# Patient Record
Sex: Male | Born: 1945 | Race: White | Hispanic: No | Smoking: Former smoker
Health system: Southern US, Community
[De-identification: ages and names within clinical notes are randomized; demographics above are authoritative.]

## PROBLEM LIST (undated history)

## (undated) DIAGNOSIS — E785 Hyperlipidemia, unspecified: Secondary | ICD-10-CM

## (undated) DIAGNOSIS — Z87891 Personal history of nicotine dependence: Secondary | ICD-10-CM

## (undated) DIAGNOSIS — I6529 Occlusion and stenosis of unspecified carotid artery: Secondary | ICD-10-CM

## (undated) DIAGNOSIS — N184 Chronic kidney disease, stage 4 (severe): Secondary | ICD-10-CM

## (undated) DIAGNOSIS — I2510 Atherosclerotic heart disease of native coronary artery without angina pectoris: Secondary | ICD-10-CM

## (undated) DIAGNOSIS — E119 Type 2 diabetes mellitus without complications: Secondary | ICD-10-CM

## (undated) DIAGNOSIS — M1990 Unspecified osteoarthritis, unspecified site: Secondary | ICD-10-CM

## (undated) DIAGNOSIS — K219 Gastro-esophageal reflux disease without esophagitis: Secondary | ICD-10-CM

## (undated) DIAGNOSIS — R0600 Dyspnea, unspecified: Secondary | ICD-10-CM

## (undated) DIAGNOSIS — F1011 Alcohol abuse, in remission: Secondary | ICD-10-CM

## (undated) DIAGNOSIS — D649 Anemia, unspecified: Secondary | ICD-10-CM

## (undated) DIAGNOSIS — N189 Chronic kidney disease, unspecified: Secondary | ICD-10-CM

## (undated) DIAGNOSIS — J449 Chronic obstructive pulmonary disease, unspecified: Secondary | ICD-10-CM

## (undated) HISTORY — PX: TONSILLECTOMY AND ADENOIDECTOMY: SUR1326

## (undated) HISTORY — DX: Personal history of nicotine dependence: Z87.891

## (undated) HISTORY — PX: CHOLECYSTECTOMY: SHX55

## (undated) HISTORY — DX: Alcohol abuse, in remission: F10.11

## (undated) HISTORY — PX: BRONCHOSCOPY: SUR163

## (undated) HISTORY — DX: Chronic kidney disease, stage 4 (severe): N18.4

---
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
## 2013-12-11 ENCOUNTER — Observation Stay (HOSPITAL_COMMUNITY)
Admission: EM | Admit: 2013-12-11 | Discharge: 2013-12-12 | Disposition: A | Discharge status: Home / Self Care | Payer: Medicare Other | Attending: Surgery | Admitting: Surgery

## 2013-12-11 ENCOUNTER — Encounter (HOSPITAL_COMMUNITY)
Admission: EM | Disposition: A | Discharge status: Home / Self Care | Payer: Self-pay | Source: Home / Self Care | Attending: Emergency Medicine

## 2013-12-11 ENCOUNTER — Emergency Department (HOSPITAL_COMMUNITY): Payer: Medicare Other | Admitting: Certified Registered Nurse Anesthetist

## 2013-12-11 ENCOUNTER — Encounter (HOSPITAL_COMMUNITY): Payer: Medicare Other | Admitting: Certified Registered Nurse Anesthetist

## 2013-12-11 ENCOUNTER — Encounter (HOSPITAL_COMMUNITY): Payer: Self-pay | Admitting: Emergency Medicine

## 2013-12-11 ENCOUNTER — Emergency Department (HOSPITAL_COMMUNITY): Payer: Medicare Other

## 2013-12-11 DIAGNOSIS — K3580 Unspecified acute appendicitis: Secondary | ICD-10-CM

## 2013-12-11 DIAGNOSIS — R1031 Right lower quadrant pain: Secondary | ICD-10-CM

## 2013-12-11 DIAGNOSIS — F17200 Nicotine dependence, unspecified, uncomplicated: Secondary | ICD-10-CM | POA: Insufficient documentation

## 2013-12-11 DIAGNOSIS — K352 Acute appendicitis with generalized peritonitis, without abscess: Secondary | ICD-10-CM | POA: Insufficient documentation

## 2013-12-11 DIAGNOSIS — K353 Acute appendicitis with localized peritonitis, without perforation or gangrene: Secondary | ICD-10-CM | POA: Diagnosis present

## 2013-12-11 DIAGNOSIS — R112 Nausea with vomiting, unspecified: Secondary | ICD-10-CM

## 2013-12-11 DIAGNOSIS — K219 Gastro-esophageal reflux disease without esophagitis: Secondary | ICD-10-CM | POA: Insufficient documentation

## 2013-12-11 DIAGNOSIS — K35209 Acute appendicitis with generalized peritonitis, without abscess, unspecified as to perforation: Principal | ICD-10-CM | POA: Insufficient documentation

## 2013-12-11 DIAGNOSIS — R000 Tachycardia, unspecified: Secondary | ICD-10-CM | POA: Insufficient documentation

## 2013-12-11 HISTORY — DX: Chronic obstructive pulmonary disease, unspecified: J44.9

## 2013-12-11 HISTORY — PX: APPENDECTOMY: SHX54

## 2013-12-11 HISTORY — DX: Unspecified osteoarthritis, unspecified site: M19.90

## 2013-12-11 HISTORY — PX: LAPAROSCOPIC APPENDECTOMY: SHX408

## 2013-12-11 HISTORY — DX: Gastro-esophageal reflux disease without esophagitis: K21.9

## 2013-12-11 HISTORY — DX: Anemia, unspecified: D64.9

## 2013-12-11 LAB — URINALYSIS, ROUTINE W REFLEX MICROSCOPIC
Glucose, UA: NEGATIVE mg/dL
Ketones, ur: 15 mg/dL — AB
LEUKOCYTES UA: NEGATIVE
NITRITE: NEGATIVE
Protein, ur: >300 mg/dL — AB
SPECIFIC GRAVITY, URINE: 1.032 — AB (ref 1.005–1.030)
UROBILINOGEN UA: 1 mg/dL (ref 0.0–1.0)
pH: 6 (ref 5.0–8.0)

## 2013-12-11 LAB — CBC WITH DIFFERENTIAL/PLATELET
Basophils Absolute: 0 10*3/uL (ref 0.0–0.1)
Basophils Relative: 0 % (ref 0–1)
EOS ABS: 0 10*3/uL (ref 0.0–0.7)
Eosinophils Relative: 0 % (ref 0–5)
HEMATOCRIT: 42 % (ref 39.0–52.0)
Hemoglobin: 14.9 g/dL (ref 13.0–17.0)
Lymphocytes Relative: 8 % — ABNORMAL LOW (ref 12–46)
Lymphs Abs: 1.1 10*3/uL (ref 0.7–4.0)
MCH: 31.6 pg (ref 26.0–34.0)
MCHC: 35.5 g/dL (ref 30.0–36.0)
MCV: 89 fL (ref 78.0–100.0)
MONO ABS: 1.1 10*3/uL — AB (ref 0.1–1.0)
Monocytes Relative: 8 % (ref 3–12)
Neutro Abs: 12.5 10*3/uL — ABNORMAL HIGH (ref 1.7–7.7)
Neutrophils Relative %: 84 % — ABNORMAL HIGH (ref 43–77)
PLATELETS: 246 10*3/uL (ref 150–400)
RBC: 4.72 MIL/uL (ref 4.22–5.81)
RDW: 13.5 % (ref 11.5–15.5)
WBC: 14.8 10*3/uL — ABNORMAL HIGH (ref 4.0–10.5)

## 2013-12-11 LAB — COMPREHENSIVE METABOLIC PANEL
ALT: 39 U/L (ref 0–53)
AST: 24 U/L (ref 0–37)
Albumin: 3.5 g/dL (ref 3.5–5.2)
Alkaline Phosphatase: 80 U/L (ref 39–117)
BUN: 16 mg/dL (ref 6–23)
CALCIUM: 9.5 mg/dL (ref 8.4–10.5)
CO2: 20 mEq/L (ref 19–32)
CREATININE: 1.36 mg/dL — AB (ref 0.50–1.35)
Chloride: 99 mEq/L (ref 96–112)
GFR calc non Af Amer: 52 mL/min — ABNORMAL LOW (ref >90)
GFR, EST AFRICAN AMERICAN: 61 mL/min — AB (ref >90)
GLUCOSE: 153 mg/dL — AB (ref 70–99)
Potassium: 4 mEq/L (ref 3.7–5.3)
SODIUM: 136 meq/L — AB (ref 137–147)
TOTAL PROTEIN: 7.5 g/dL (ref 6.0–8.3)
Total Bilirubin: 1.1 mg/dL (ref 0.3–1.2)

## 2013-12-11 LAB — URINE MICROSCOPIC-ADD ON

## 2013-12-11 LAB — LIPASE, BLOOD: LIPASE: 42 U/L (ref 11–59)

## 2013-12-11 SURGERY — APPENDECTOMY, LAPAROSCOPIC
Anesthesia: General | Site: Abdomen

## 2013-12-11 MED ORDER — PIPERACILLIN-TAZOBACTAM 3.375 G IVPB 30 MIN: 3.3750 g | Freq: Once | INTRAVENOUS | Status: DC

## 2013-12-11 MED ORDER — LIDOCAINE HCL (CARDIAC) 20 MG/ML IV SOLN
INTRAVENOUS | Status: AC
  Filled 2013-12-11: qty 5

## 2013-12-11 MED ORDER — KCL IN DEXTROSE-NACL 20-5-0.45 MEQ/L-%-% IV SOLN
INTRAVENOUS | Status: DC
  Administered 2013-12-11: 18:00:00 via INTRAVENOUS
  Filled 2013-12-11 (×3): qty 1000

## 2013-12-11 MED ORDER — SUCCINYLCHOLINE CHLORIDE 20 MG/ML IJ SOLN
INTRAMUSCULAR | Status: DC | PRN
  Administered 2013-12-11: 100 mg via INTRAVENOUS

## 2013-12-11 MED ORDER — ROCURONIUM BROMIDE 100 MG/10ML IV SOLN
INTRAVENOUS | Status: DC | PRN
  Administered 2013-12-11: 50 mg via INTRAVENOUS

## 2013-12-11 MED ORDER — MIDAZOLAM HCL 2 MG/2ML IJ SOLN
INTRAMUSCULAR | Status: AC
  Filled 2013-12-11: qty 2

## 2013-12-11 MED ORDER — OXYCODONE HCL 5 MG PO TABS: 5.0000 mg | ORAL_TABLET | Freq: Once | ORAL | Status: AC | PRN

## 2013-12-11 MED ORDER — ONDANSETRON HCL 4 MG/2ML IJ SOLN: 4.0000 mg | Freq: Four times a day (QID) | INTRAMUSCULAR | Status: DC | PRN

## 2013-12-11 MED ORDER — FENTANYL CITRATE 0.05 MG/ML IJ SOLN
INTRAMUSCULAR | Status: AC
  Filled 2013-12-11: qty 5

## 2013-12-11 MED ORDER — OXYCODONE-ACETAMINOPHEN 5-325 MG PO TABS: 1.0000 | ORAL_TABLET | ORAL | Status: DC | PRN

## 2013-12-11 MED ORDER — LABETALOL HCL 5 MG/ML IV SOLN
INTRAVENOUS | Status: DC | PRN
  Administered 2013-12-11 (×2): 5 mg via INTRAVENOUS

## 2013-12-11 MED ORDER — SODIUM CHLORIDE 0.9 % IV BOLUS (SEPSIS)
1000.0000 mL | Freq: Once | INTRAVENOUS | Status: AC
  Administered 2013-12-11: 1000 mL via INTRAVENOUS

## 2013-12-11 MED ORDER — LACTATED RINGERS IV SOLN
INTRAVENOUS | Status: DC | PRN
  Administered 2013-12-11 (×2): via INTRAVENOUS

## 2013-12-11 MED ORDER — ALBUTEROL SULFATE (2.5 MG/3ML) 0.083% IN NEBU
INHALATION_SOLUTION | RESPIRATORY_TRACT | Status: AC
  Filled 2013-12-11: qty 3

## 2013-12-11 MED ORDER — NEOSTIGMINE METHYLSULFATE 10 MG/10ML IV SOLN
INTRAVENOUS | Status: DC | PRN
  Administered 2013-12-11: 3 mg via INTRAVENOUS

## 2013-12-11 MED ORDER — ENOXAPARIN SODIUM 40 MG/0.4ML ~~LOC~~ SOLN
40.0000 mg | SUBCUTANEOUS | Status: DC
  Filled 2013-12-11 (×2): qty 0.4

## 2013-12-11 MED ORDER — PNEUMOCOCCAL VAC POLYVALENT 25 MCG/0.5ML IJ INJ
0.5000 mL | INJECTION | INTRAMUSCULAR | Status: DC
  Filled 2013-12-11: qty 0.5

## 2013-12-11 MED ORDER — LIDOCAINE HCL (CARDIAC) 20 MG/ML IV SOLN
INTRAVENOUS | Status: DC | PRN
  Administered 2013-12-11: 100 mg via INTRAVENOUS

## 2013-12-11 MED ORDER — PIPERACILLIN-TAZOBACTAM 3.375 G IVPB 30 MIN
3.3750 g | INTRAVENOUS | Status: DC
  Filled 2013-12-11: qty 50

## 2013-12-11 MED ORDER — MORPHINE SULFATE 4 MG/ML IJ SOLN
4.0000 mg | Freq: Once | INTRAMUSCULAR | Status: AC
  Administered 2013-12-11: 4 mg via INTRAVENOUS
  Filled 2013-12-11: qty 1

## 2013-12-11 MED ORDER — PIPERACILLIN-TAZOBACTAM 3.375 G IVPB
3.3750 g | Freq: Three times a day (TID) | INTRAVENOUS | Status: DC
  Administered 2013-12-11 – 2013-12-12 (×2): 3.375 g via INTRAVENOUS
  Filled 2013-12-11 (×4): qty 50

## 2013-12-11 MED ORDER — ALBUTEROL SULFATE HFA 108 (90 BASE) MCG/ACT IN AERS
INHALATION_SPRAY | RESPIRATORY_TRACT | Status: DC | PRN
  Administered 2013-12-11: 6 via RESPIRATORY_TRACT

## 2013-12-11 MED ORDER — ONDANSETRON HCL 4 MG PO TABS: 4.0000 mg | ORAL_TABLET | Freq: Four times a day (QID) | ORAL | Status: DC | PRN

## 2013-12-11 MED ORDER — ONDANSETRON 4 MG PO TBDP
8.0000 mg | ORAL_TABLET | Freq: Once | ORAL | Status: AC
Start: 2013-12-11 — End: 2013-12-11
  Administered 2013-12-11: 8 mg via ORAL
  Filled 2013-12-11: qty 2

## 2013-12-11 MED ORDER — MORPHINE SULFATE 2 MG/ML IJ SOLN: 2.0000 mg | INTRAMUSCULAR | Status: DC | PRN

## 2013-12-11 MED ORDER — HYDROMORPHONE HCL PF 1 MG/ML IJ SOLN: 0.2500 mg | INTRAMUSCULAR | Status: DC | PRN

## 2013-12-11 MED ORDER — BUPIVACAINE-EPINEPHRINE (PF) 0.25% -1:200000 IJ SOLN
INTRAMUSCULAR | Status: AC
  Filled 2013-12-11: qty 30

## 2013-12-11 MED ORDER — CEFAZOLIN SODIUM-DEXTROSE 2-3 GM-% IV SOLR
INTRAVENOUS | Status: DC | PRN
  Administered 2013-12-11: 2 g via INTRAVENOUS

## 2013-12-11 MED ORDER — OXYCODONE HCL 5 MG/5ML PO SOLN
5.0000 mg | Freq: Once | ORAL | Status: AC | PRN
Start: 2013-12-11 — End: 2013-12-11

## 2013-12-11 MED ORDER — ACETAMINOPHEN 325 MG PO TABS: 650.0000 mg | ORAL_TABLET | ORAL | Status: DC | PRN

## 2013-12-11 MED ORDER — IOHEXOL 300 MG/ML  SOLN
25.0000 mL | INTRAMUSCULAR | Status: AC
  Administered 2013-12-11: 25 mL via ORAL

## 2013-12-11 MED ORDER — SODIUM CHLORIDE 0.9 % IV SOLN
Freq: Once | INTRAVENOUS | Status: AC
  Administered 2013-12-11: 11:00:00 via INTRAVENOUS

## 2013-12-11 MED ORDER — GLYCOPYRROLATE 0.2 MG/ML IJ SOLN
INTRAMUSCULAR | Status: DC | PRN
Start: 2013-12-11 — End: 2013-12-11
  Administered 2013-12-11: .4 mg via INTRAVENOUS

## 2013-12-11 MED ORDER — IOHEXOL 300 MG/ML  SOLN
100.0000 mL | Freq: Once | INTRAMUSCULAR | Status: AC | PRN
Start: 2013-12-11 — End: 2013-12-11
  Administered 2013-12-11: 80 mL via INTRAVENOUS

## 2013-12-11 MED ORDER — PROMETHAZINE HCL 25 MG/ML IJ SOLN
12.5000 mg | Freq: Once | INTRAMUSCULAR | Status: AC
  Administered 2013-12-11: 12.5 mg via INTRAVENOUS
  Filled 2013-12-11: qty 1

## 2013-12-11 MED ORDER — PROPOFOL 10 MG/ML IV BOLUS
INTRAVENOUS | Status: AC
  Filled 2013-12-11: qty 20

## 2013-12-11 MED ORDER — KETOROLAC TROMETHAMINE 30 MG/ML IJ SOLN
30.0000 mg | Freq: Four times a day (QID) | INTRAMUSCULAR | Status: DC
  Administered 2013-12-11 – 2013-12-12 (×3): 30 mg via INTRAVENOUS
  Filled 2013-12-11 (×7): qty 1

## 2013-12-11 MED ORDER — PROPOFOL 10 MG/ML IV BOLUS
INTRAVENOUS | Status: DC | PRN
  Administered 2013-12-11: 200 mg via INTRAVENOUS

## 2013-12-11 MED ORDER — BUPIVACAINE-EPINEPHRINE 0.25% -1:200000 IJ SOLN
INTRAMUSCULAR | Status: DC | PRN
  Administered 2013-12-11: 30 mL

## 2013-12-11 MED ORDER — ALBUTEROL SULFATE (2.5 MG/3ML) 0.083% IN NEBU
2.5000 mg | INHALATION_SOLUTION | Freq: Once | RESPIRATORY_TRACT | Status: AC
  Administered 2013-12-11: 2.5 mg via RESPIRATORY_TRACT

## 2013-12-11 MED ORDER — SODIUM CHLORIDE 0.9 % IR SOLN
Status: DC | PRN
  Administered 2013-12-11: 1000 mL

## 2013-12-11 MED ORDER — FENTANYL CITRATE 0.05 MG/ML IJ SOLN
INTRAMUSCULAR | Status: DC | PRN
  Administered 2013-12-11: 150 ug via INTRAVENOUS
  Administered 2013-12-11 (×2): 100 ug via INTRAVENOUS

## 2013-12-11 MED ORDER — ONDANSETRON HCL 4 MG/2ML IJ SOLN
INTRAMUSCULAR | Status: DC | PRN
Start: 2013-12-11 — End: 2013-12-11
  Administered 2013-12-11: 4 mg via INTRAVENOUS

## 2013-12-11 SURGICAL SUPPLY — 41 items
BENZOIN TINCTURE PRP APPL 2/3 (GAUZE/BANDAGES/DRESSINGS) ×3 IMPLANT
BLADE SURG ROTATE 9660 (MISCELLANEOUS) ×3 IMPLANT
CANISTER SUCTION 2500CC (MISCELLANEOUS) ×3 IMPLANT
CHLORAPREP W/TINT 26ML (MISCELLANEOUS) ×3 IMPLANT
CLOSURE STERI-STRIP 1/2X4 (GAUZE/BANDAGES/DRESSINGS) ×1
CLSR STERI-STRIP ANTIMIC 1/2X4 (GAUZE/BANDAGES/DRESSINGS) ×2 IMPLANT
COVER SURGICAL LIGHT HANDLE (MISCELLANEOUS) ×3 IMPLANT
CUTTER FLEX LINEAR 45M (STAPLE) ×3 IMPLANT
DRAPE UTILITY 15X26 W/TAPE STR (DRAPE) ×6 IMPLANT
DRSG TEGADERM 2-3/8X2-3/4 SM (GAUZE/BANDAGES/DRESSINGS) ×6 IMPLANT
DRSG TEGADERM 4X4.75 (GAUZE/BANDAGES/DRESSINGS) ×3 IMPLANT
ELECT REM PT RETURN 9FT ADLT (ELECTROSURGICAL) ×3
ELECTRODE REM PT RTRN 9FT ADLT (ELECTROSURGICAL) ×1 IMPLANT
FILTER SMOKE EVAC LAPAROSHD (FILTER) ×3 IMPLANT
GAUZE SPONGE 2X2 8PLY STRL LF (GAUZE/BANDAGES/DRESSINGS) ×1 IMPLANT
GLOVE BIO SURGEON STRL SZ 6.5 (GLOVE) ×2 IMPLANT
GLOVE BIO SURGEON STRL SZ7 (GLOVE) ×6 IMPLANT
GLOVE BIO SURGEONS STRL SZ 6.5 (GLOVE) ×1
GLOVE BIOGEL PI IND STRL 7.0 (GLOVE) ×1 IMPLANT
GLOVE BIOGEL PI IND STRL 7.5 (GLOVE) ×2 IMPLANT
GLOVE BIOGEL PI INDICATOR 7.0 (GLOVE) ×2
GLOVE BIOGEL PI INDICATOR 7.5 (GLOVE) ×4
GLOVE ECLIPSE 7.5 STRL STRAW (GLOVE) ×3 IMPLANT
GOWN STRL REUS W/ TWL LRG LVL3 (GOWN DISPOSABLE) ×3 IMPLANT
GOWN STRL REUS W/TWL LRG LVL3 (GOWN DISPOSABLE) ×6
KIT BASIN OR (CUSTOM PROCEDURE TRAY) ×3 IMPLANT
KIT ROOM TURNOVER OR (KITS) ×3 IMPLANT
NS IRRIG 1000ML POUR BTL (IV SOLUTION) ×3 IMPLANT
PAD ARMBOARD 7.5X6 YLW CONV (MISCELLANEOUS) ×6 IMPLANT
POUCH SPECIMEN RETRIEVAL 10MM (ENDOMECHANICALS) ×3 IMPLANT
RELOAD STAPLE TA45 3.5 REG BLU (ENDOMECHANICALS) ×3 IMPLANT
SCALPEL HARMONIC ACE (MISCELLANEOUS) ×3 IMPLANT
SET IRRIG TUBING LAPAROSCOPIC (IRRIGATION / IRRIGATOR) ×3 IMPLANT
SPECIMEN JAR SMALL (MISCELLANEOUS) ×3 IMPLANT
SPONGE GAUZE 2X2 STER 10/PKG (GAUZE/BANDAGES/DRESSINGS) ×2
SUT MNCRL AB 4-0 PS2 18 (SUTURE) ×3 IMPLANT
TOWEL OR 17X24 6PK STRL BLUE (TOWEL DISPOSABLE) ×3 IMPLANT
TOWEL OR 17X26 10 PK STRL BLUE (TOWEL DISPOSABLE) ×3 IMPLANT
TRAY LAPAROSCOPIC (CUSTOM PROCEDURE TRAY) ×3 IMPLANT
TROCAR XCEL BLADELESS 5X75MML (TROCAR) ×6 IMPLANT
TROCAR XCEL BLUNT TIP 100MML (ENDOMECHANICALS) ×3 IMPLANT

## 2013-12-11 NOTE — Progress Notes (Signed)
Patient belongings labeled and placed in PACU. Valuables sent to security via ER RN and then key given to [place].

## 2013-12-11 NOTE — ED Notes (Signed)
Per pt sts that yesterday he began having N,V and RLQ pain. Sent here to R/O appendicitis. sts small BM yesterday.

## 2013-12-11 NOTE — Op Note (Signed)
Appendectomy, Lap, Procedure Note  Indications: The patient presented with a history of right-sided abdominal pain. A CT scan revealed findings consistent with acute appendicitis.  Pre-operative Diagnosis: Acute appendicitis with generalized peritonitis  Post-operative Diagnosis: Same  Surgeon: Imogene Burn. Ravon Mortellaro   Assistants: none  Anesthesia: General endotracheal anesthesia  ASA Class: 2  Procedure Details  The patient was seen again in the Holding Room. The risks, benefits, complications, treatment options, and expected outcomes were discussed with the patient and/or family. The possibilities of reaction to medication, perforation of viscus, bleeding, recurrent infection, finding a normal appendix, the need for additional procedures, failure to diagnose a condition, and creating a complication requiring transfusion or operation were discussed. There was concurrence with the proposed plan and informed consent was obtained. The site of surgery was properly noted. The patient was taken to Operating Room, identified as Dayson Aboud and the procedure verified as Appendectomy. A Time Out was held and the above information confirmed.  The patient was placed in the supine position and general anesthesia was induced.  The abdomen was prepped and draped in a sterile fashion. A one centimeter infraumbilical incision was made.  Dissection was carried down to the fascia bluntly.  The fascia was incised vertically.  We entered the peritoneal cavity bluntly.  A pursestring suture was passed around the incision with a 0 Vicryl.  The Hasson cannula was introduced into the abdomen and the tails of the suture were used to hold the Hasson in place.   The pneumoperitoneum was then established maintaining a maximum pressure of 15 mmHg.  Additional 5 mm cannulas then placed in the left lower quadrant of the abdomen and the right upper quadrant under direct visualization. A careful evaluation of the entire abdomen  was carried out. The patient was placed in Trendelenburg and left lateral decubitus position.  The scope was moved to the right upper quadrant port site. There was some purulent fluid in the pelvis.  The cecum was mobilized medially.  The appendix was identified and was quite inflamed and ischemic-appearing.  The appendix was carefully dissected. The appendix was was skeletonized with the harmonic scalpel.   The appendix was divided at its base using an endo-GIA stapler. Minimal appendiceal stump was left in place. There was no evidence of bleeding, leakage, or complication after division of the appendix. Irrigation was also performed and irrigate suctioned from the abdomen as well.  The umbilical port site was closed with the purse string suture. There was no residual palpable fascial defect.  The trocar site skin wounds were closed with 4-0 Monocryl.  Instrument, sponge, and needle counts were correct at the conclusion of the case.   Findings: The appendix was found to be inflamed. There were signs of necrosis.  There was not perforation. There was not abscess formation.  Estimated Blood Loss:  less than 50 mL         Drains: none         Specimens: appendix         Complications:  None; patient tolerated the procedure well.         Disposition: PACU - hemodynamically stable.         Condition: stable  Imogene Burn. Georgette Dover, MD, [facility]  General/ Trauma Surgery  12/11/2013 2:36 PM

## 2013-12-11 NOTE — Anesthesia Preprocedure Evaluation (Addendum)
Anesthesia Evaluation  Patient identified by MRN, date of birth, ID band Patient awake    Reviewed: Allergy & Precautions, H&P , NPO status , Patient's Chart, lab work & pertinent test results  History of Anesthesia Complications Negative for: history of anesthetic complications  Airway Mallampati: II      Dental   Pulmonary Current Smoker,    Pulmonary exam normal       Cardiovascular negative cardio ROS  Rhythm:Regular Rate:Tachycardia     Neuro/Psych    GI/Hepatic Neg liver ROS, GERD-  Medicated,  Endo/Other  negative endocrine ROS  Renal/GU negative Renal ROS     Musculoskeletal   Abdominal   Peds  Hematology negative hematology ROS (+)   Anesthesia Other Findings   Reproductive/Obstetrics                          Anesthesia Physical Anesthesia Plan  ASA: II and emergent  Anesthesia Plan: General   Post-op Pain Management:    Induction: Intravenous  Airway Management Planned: Oral ETT  Additional Equipment:   Intra-op Plan:   Post-operative Plan: Extubation in OR  Informed Consent: I have reviewed the patients History and Physical, chart, labs and discussed the procedure including the risks, benefits and alternatives for the proposed anesthesia with the patient or authorized representative who has indicated his/her understanding and acceptance.   Dental advisory given  Plan Discussed with: CRNA, Anesthesiologist and Surgeon  Anesthesia Plan Comments:         Anesthesia Quick Evaluation

## 2013-12-11 NOTE — ED Provider Notes (Signed)
Medical screening examination/treatment/procedure(s) were conducted as a shared visit with non-physician practitioner(s) and myself.  I personally evaluated the patient during the encounter.   EKG Interpretation None      Patient presenting with vomiting and abdominal pain. Patient has exam findings concerning for appendicitis with right lower quadrant guarding, rebound. He denies any urinary symptoms. CT consistent with appendicitis without complicating features. Surgery to see the patient for appendicitis  Blanchie Dessert, MD 12/11/13 [phone]

## 2013-12-11 NOTE — Progress Notes (Signed)
Patient daughter's number [phone]

## 2013-12-11 NOTE — ED Notes (Signed)
PA Hannah at bedside.

## 2013-12-11 NOTE — H&P (Signed)
Paul Mcneil is an 69 y.o. male.   Chief Complaint: RLQ pain HPI: 68 yo male with hx of tobacco abuse presents with 24 hours of abdominal pain now localized to the RLQ.  + Nausea, vomiting.  Some diarrhea.  Presented to the ED for evaluation and was found to have acute appendicitis  History reviewed. No pertinent past medical history.  Past Surgical History  Procedure Laterality Date  . Cholecystectomy      History reviewed. No pertinent family history. Social History:  reports that he has been smoking.  He does not have any smokeless tobacco history on file. He reports that he drinks alcohol. His drug history is not on file.  Allergies:  Allergies  Allergen Reactions  . Ceclor [Cefaclor] Hives and Other (See Comments)    Dropped blood pressure    Prior to Admission medications   Medication Sig Start Date End Date Taking? Authorizing Provider  esomeprazole (NEXIUM) 40 MG capsule Take 40 mg by mouth daily as needed (for acid reflux).   Yes Historical Provider, MD  Probiotic Product (RESTORA PO) Take 1 tablet by mouth daily as needed (for regularity).   Yes Historical Provider, MD     Results for orders placed during the hospital encounter of 12/11/13 (from the past 48 hour(s))  CBC WITH DIFFERENTIAL     Status: Abnormal   Collection Time    12/11/13  9:57 AM      Result Value Ref Range   WBC 14.8 (*) 4.0 - 10.5 K/uL   RBC 4.72  4.22 - 5.81 MIL/uL   Hemoglobin 14.9  13.0 - 17.0 g/dL   HCT 62.1  94.7 - 12.5 %   MCV 89.0  78.0 - 100.0 fL   MCH 31.6  26.0 - 34.0 pg   MCHC 35.5  30.0 - 36.0 g/dL   RDW 27.1  29.2 - 90.9 %   Platelets 246  150 - 400 K/uL   Neutrophils Relative % 84 (*) 43 - 77 %   Neutro Abs 12.5 (*) 1.7 - 7.7 K/uL   Lymphocytes Relative 8 (*) 12 - 46 %   Lymphs Abs 1.1  0.7 - 4.0 K/uL   Monocytes Relative 8  3 - 12 %   Monocytes Absolute 1.1 (*) 0.1 - 1.0 K/uL   Eosinophils Relative 0  0 - 5 %   Eosinophils Absolute 0.0  0.0 - 0.7 K/uL   Basophils  Relative 0  0 - 1 %   Basophils Absolute 0.0  0.0 - 0.1 K/uL  COMPREHENSIVE METABOLIC PANEL     Status: Abnormal   Collection Time    12/11/13  9:57 AM      Result Value Ref Range   Sodium 136 (*) 137 - 147 mEq/L   Potassium 4.0  3.7 - 5.3 mEq/L   Chloride 99  96 - 112 mEq/L   CO2 20  19 - 32 mEq/L   Glucose, Bld 153 (*) 70 - 99 mg/dL   BUN 16  6 - 23 mg/dL   Creatinine, Ser 0.30 (*) 0.50 - 1.35 mg/dL   Calcium 9.5  8.4 - 14.9 mg/dL   Total Protein 7.5  6.0 - 8.3 g/dL   Albumin 3.5  3.5 - 5.2 g/dL   AST 24  0 - 37 U/L   ALT 39  0 - 53 U/L   Alkaline Phosphatase 80  39 - 117 U/L   Total Bilirubin 1.1  0.3 - 1.2 mg/dL   GFR calc non  Af Amer 52 (*) >90 mL/min   GFR calc Af Amer 61 (*) >90 mL/min   Comment: (NOTE)     The eGFR has been calculated using the CKD EPI equation.     This calculation has not been validated in all clinical situations.     eGFR's persistently <90 mL/min signify possible Chronic Kidney     Disease.  LIPASE, BLOOD     Status: None   Collection Time    12/11/13  9:57 AM      Result Value Ref Range   Lipase 42  11 - 59 U/L  URINALYSIS, ROUTINE W REFLEX MICROSCOPIC     Status: Abnormal   Collection Time    12/11/13 10:32 AM      Result Value Ref Range   Color, Urine AMBER (*) YELLOW   Comment: BIOCHEMICALS MAY BE AFFECTED BY COLOR   APPearance CLEAR  CLEAR   Specific Gravity, Urine 1.032 (*) 1.005 - 1.030   pH 6.0  5.0 - 8.0   Glucose, UA NEGATIVE  NEGATIVE mg/dL   Hgb urine dipstick MODERATE (*) NEGATIVE   Bilirubin Urine SMALL (*) NEGATIVE   Ketones, ur 15 (*) NEGATIVE mg/dL   Protein, ur >854 (*) NEGATIVE mg/dL   Urobilinogen, UA 1.0  0.0 - 1.0 mg/dL   Nitrite NEGATIVE  NEGATIVE   Leukocytes, UA NEGATIVE  NEGATIVE  URINE MICROSCOPIC-ADD ON     Status: Abnormal   Collection Time    12/11/13 10:32 AM      Result Value Ref Range   Squamous Epithelial / LPF RARE  RARE   WBC, UA 0-2  <3 WBC/hpf   RBC / HPF 0-2  <3 RBC/hpf   Bacteria, UA RARE   RARE   Casts HYALINE CASTS (*) NEGATIVE   Ct Abdomen Pelvis W Contrast  12/11/2013   CLINICAL DATA:  Nausea, vomiting, and right lower quadrant pain.  EXAM: CT ABDOMEN AND PELVIS WITH CONTRAST  TECHNIQUE: Multidetector CT imaging of the abdomen and pelvis was performed using the standard protocol following bolus administration of intravenous contrast.  CONTRAST:  62mL OMNIPAQUE IOHEXOL 300 MG/ML SOLN intravenously. The patient also received oral contrast material.  COMPARISON:  None.  FINDINGS: On images 19 through 50 the appendix is inflamed appearing and contains an appendicolith. There is increased density in the surrounding fat. A discrete abscess is not demonstrated. No free fluid is demonstrated in the abdomen or pelvis. The small and large bowel loops exhibit no obstructive findings. There is a moderate volume of fluid in the right colon.  The gallbladder is surgically absent. The liver exhibits decreased density diffusely consistent with fatty infiltration. The pancreas, spleen, stomach, adrenal glands, and periaortic and pericaval regions are normal. There is a small hiatal hernia. There is mild failure to type of the caliber of the abdominal aorta without evidence of an aneurysm. There is a 2.1 cm diameter hypodensity in the lower pole of the left kidney with Hounsfield measurement of -4 consistent with a cyst. The urinary bladder and prostate gland and seminal vesicles are normal. There is no inguinal nor umbilical hernia.  The lumbar spine and bony pelvis are normal for age. There is dependent atelectasis at the lung bases.  IMPRESSION: 1. The findings are consistent with acute appendicitis. There are surrounding inflammatory changes without evidence of an abscess or free fluid or free air. 2. There is no acute intra-abdominal or pelvic abnormality otherwise. 3. These results were called by telephone at the time of  interpretation on 12/11/2013 at 11:46 AM to [facility], , who verbally  acknowledged these results.   Electronically Signed   By: David  Martinique   On: 12/11/2013 11:49    Review of Systems  Constitutional: Negative for weight loss.  HENT: Negative for ear discharge, ear pain, hearing loss and tinnitus.   Eyes: Negative for blurred vision, double vision, photophobia and pain.  Respiratory: Negative for cough, sputum production and shortness of breath.   Cardiovascular: Negative for chest pain.  Gastrointestinal: Positive for nausea, vomiting, abdominal pain and diarrhea.  Genitourinary: Negative for dysuria, urgency, frequency and flank pain.  Musculoskeletal: Negative for back pain, falls, joint pain, myalgias and neck pain.  Neurological: Negative for dizziness, tingling, sensory change, focal weakness, loss of consciousness and headaches.  Endo/Heme/Allergies: Does not bruise/bleed easily.  Psychiatric/Behavioral: Negative for depression, memory loss and substance abuse. The patient is not nervous/anxious.     Blood pressure 129/79, pulse 88, temperature 98.3 F (36.8 C), resp. rate 16, weight 230 lb (104.327 kg), SpO2 100.00%. Physical Exam  WDWN in NAD HEENT:  EOMI, sclera anicteric Neck:  No masses, no thyromegaly Lungs:  CTA bilaterally; normal respiratory effort CV:  Regular rate and rhythm; no murmurs Abd:  +bowel sounds, soft, tender in RLQ; healed lap chole incisions Ext:  Well-perfused; no edema Skin:  Warm, dry; no sign of jaundice  Assessment/Plan Acute appendicitis - no obvious sign of perforation  Laparoscopic appendectomy today.  The surgical procedure has been discussed with the patient.  Potential risks, benefits, alternative treatments, and expected outcomes have been explained.  All of the patient's questions at this time have been answered.  The likelihood of reaching the patient's treatment goal is good.  The patient understand the proposed surgical procedure and wishes to proceed.   Imogene Burn. Maegan Buller 12/11/2013, 12:43 PM

## 2013-12-11 NOTE — Transfer of Care (Signed)
Immediate Anesthesia Transfer of Care Note  Patient: Paul Mcneil  Procedure(s) Performed: Procedure(s): APPENDECTOMY LAPAROSCOPIC (N/A)  Patient Location: PACU  Anesthesia Type:General  Level of Consciousness: awake, alert  and patient cooperative  Airway & Oxygen Therapy: Patient Spontanous Breathing, Patient connected to nasal cannula oxygen and Patient connected to face mask oxygen  Post-op Assessment: Report given to PACU RN and Post -op Vital signs reviewed and stable  Post vital signs: Reviewed and stable  Complications: No apparent anesthesia complications

## 2013-12-11 NOTE — Anesthesia Postprocedure Evaluation (Signed)
  Anesthesia Post-op Note  Patient: Paul Mcneil  Procedure(s) Performed: Procedure(s): APPENDECTOMY LAPAROSCOPIC (N/A)  Patient Location: PACU  Anesthesia Type:General  Level of Consciousness: awake and alert   Airway and Oxygen Therapy: Patient Spontanous Breathing  Post-op Pain: none  Post-op Assessment: Post-op Vital signs reviewed, Patient's Cardiovascular Status Stable and Respiratory Function Stable  Post-op Vital Signs: Reviewed  Filed Vitals:   12/11/13 1610  BP: 114/61  Pulse: 101  Temp: 37.3 C  Resp: 13    Complications: No apparent anesthesia complications

## 2013-12-11 NOTE — Anesthesia Procedure Notes (Signed)
Date/Time: 12/11/2013 1:40 PM Performed by: Izora Gala Pre-anesthesia Checklist: Patient identified, Emergency Drugs available, Suction available and Patient being monitored Patient Re-evaluated:Patient Re-evaluated prior to inductionOxygen Delivery Method: Circle system utilized Preoxygenation: Pre-oxygenation with 100% oxygen Intubation Type: IV induction Ventilation: Mask ventilation without difficulty Laryngoscope Size: Miller and 3 Grade View: Grade II Tube type: Oral Tube size: 7.5 mm Number of attempts: 1 Airway Equipment and Method: Stylet Placement Confirmation: ETT inserted through vocal cords under direct vision,  positive ETCO2 and breath sounds checked- equal and bilateral Secured at: 23 cm Tube secured with: Tape Dental Injury: Teeth and Oropharynx as per pre-operative assessment

## 2013-12-11 NOTE — ED Notes (Signed)
PA at bedside.

## 2013-12-11 NOTE — ED Provider Notes (Signed)
CSN: [id number]     Arrival date & time 12/11/13  4580 History   First MD Initiated Contact with Patient 12/11/13 [phone]     Chief Complaint  Patient presents with  . Abdominal Pain     (Consider location/radiation/quality/duration/timing/severity/associated sxs/prior Treatment) HPI Comments: Patient is a 68 year old male with history of prior cholecystectomy who presents today with nausea, vomiting, abdominal pain since yesterday. He reports that the pain started suddenly and is a sharp pain in his right lower quadrant. The pain began in his entire abdomen, but now is only in his right lower quadrant. He had mild associated diarrhea this morning. [facility] who suggested he come to the emergency department for appendicitis rule out. No fevers, chills, testicle pain, dysuria or chest pains or shortness.  Patient is a 68 y.o. male presenting with abdominal pain. The history is provided by the patient. No language interpreter was used.  Abdominal Pain Associated symptoms: diarrhea, nausea and vomiting   Associated symptoms: no chest pain, no chills, no fever and no shortness of breath     History reviewed. No pertinent past medical history. Past Surgical History  Procedure Laterality Date  . Cholecystectomy     History reviewed. No pertinent family history. History  Substance Use Topics  . Smoking status: Current Every Day Smoker  . Smokeless tobacco: Not on file  . Alcohol Use: Yes    Review of Systems  Constitutional: Negative for fever and chills.  Respiratory: Negative for shortness of breath.   Cardiovascular: Negative for chest pain.  Gastrointestinal: Positive for nausea, vomiting, abdominal pain and diarrhea.  Genitourinary: Negative for testicular pain.  All other systems reviewed and are negative.     Allergies  Ceclor  Home Medications   Prior to Admission medications   Not on File   BP 135/74  Pulse 92  Temp(Src) 98.3 F (36.8 C)  Resp 18   Wt 230 lb (104.327 kg)  SpO2 95% Physical Exam  Nursing note and vitals reviewed. Constitutional: He is oriented to person, place, and time. He appears well-developed and well-nourished. No distress.  HENT:  Head: Normocephalic and atraumatic.  Right Ear: External ear normal.  Left Ear: External ear normal.  Nose: Nose normal.  Eyes: Conjunctivae are normal.  Neck: Normal range of motion. No tracheal deviation present.  Cardiovascular: Normal rate, regular rhythm and normal heart sounds.   Pulmonary/Chest: Effort normal and breath sounds normal. No stridor.  Abdominal: Soft. He exhibits no distension. There is tenderness in the right lower quadrant. There is no rigidity, no rebound and no guarding.  Genitourinary: Penis normal. Right testis shows no mass, no swelling and no tenderness. Right testis is descended. Left testis shows no mass, no swelling and no tenderness. Left testis is descended. Circumcised.  Musculoskeletal: Normal range of motion.  Neurological: He is alert and oriented to person, place, and time.  Skin: Skin is warm and dry. He is not diaphoretic.  Psychiatric: He has a normal mood and affect. His behavior is normal.    ED Course  Procedures (including critical care time) Labs Review Labs Reviewed  CBC WITH DIFFERENTIAL - Abnormal; Notable for the following:    WBC 14.8 (*)    Neutrophils Relative % 84 (*)    Neutro Abs 12.5 (*)    Lymphocytes Relative 8 (*)    Monocytes Absolute 1.1 (*)    All other components within normal limits  COMPREHENSIVE METABOLIC PANEL - Abnormal; Notable for the following:  Sodium 136 (*)    Glucose, Bld 153 (*)    Creatinine, Ser 1.36 (*)    GFR calc non Af Amer 52 (*)    GFR calc Af Amer 61 (*)    All other components within normal limits  URINALYSIS, ROUTINE W REFLEX MICROSCOPIC - Abnormal; Notable for the following:    Color, Urine AMBER (*)    Specific Gravity, Urine 1.032 (*)    Hgb urine dipstick MODERATE (*)     Bilirubin Urine SMALL (*)    Ketones, ur 15 (*)    Protein, ur >300 (*)    All other components within normal limits  URINE MICROSCOPIC-ADD ON - Abnormal; Notable for the following:    Casts HYALINE CASTS (*)    All other components within normal limits  LIPASE, BLOOD    Imaging Review Ct Abdomen Pelvis W Contrast  12/11/2013   CLINICAL DATA:  Nausea, vomiting, and right lower quadrant pain.  EXAM: CT ABDOMEN AND PELVIS WITH CONTRAST  TECHNIQUE: Multidetector CT imaging of the abdomen and pelvis was performed using the standard protocol following bolus administration of intravenous contrast.  CONTRAST:  23mL OMNIPAQUE IOHEXOL 300 MG/ML SOLN intravenously. The patient also received oral contrast material.  COMPARISON:  None.  FINDINGS: On images 44 through 36 the appendix is inflamed appearing and contains an appendicolith. There is increased density in the surrounding fat. A discrete abscess is not demonstrated. No free fluid is demonstrated in the abdomen or pelvis. The small and large bowel loops exhibit no obstructive findings. There is a moderate volume of fluid in the right colon.  The gallbladder is surgically absent. The liver exhibits decreased density diffusely consistent with fatty infiltration. The pancreas, spleen, stomach, adrenal glands, and periaortic and pericaval regions are normal. There is a small hiatal hernia. There is mild failure to type of the caliber of the abdominal aorta without evidence of an aneurysm. There is a 2.1 cm diameter hypodensity in the lower pole of the left kidney with Hounsfield measurement of -4 consistent with a cyst. The urinary bladder and prostate gland and seminal vesicles are normal. There is no inguinal nor umbilical hernia.  The lumbar spine and bony pelvis are normal for age. There is dependent atelectasis at the lung bases.  IMPRESSION: 1. The findings are consistent with acute appendicitis. There are surrounding inflammatory changes without evidence  of an abscess or free fluid or free air. 2. There is no acute intra-abdominal or pelvic abnormality otherwise. 3. These results were called by telephone at the time of interpretation on 12/11/2013 at 11:46 AM to [facility], [place], , who verbally acknowledged these results.   Electronically Signed   By: David  Martinique   On: 12/11/2013 11:49     EKG Interpretation None      MDM   Final diagnoses:  Acute appendicitis    Patient presents to ED for evaluation of abdominal pain. CT abdomen shows acute appendicitis. Patient is otherwise healthy. Surgery has been consulted and plans to take patient to OR. Hemodynamically stable. Dr. Maryan Rued evaluated patient and agrees with plan. Patient / Family / Caregiver informed of clinical course, understand medical decision-making process, and agree with plan.    Elwyn Lade, PA-C 12/11/13 1401

## 2013-12-11 NOTE — OR Nursing (Signed)
To OR with personal belongings in two separate belonging bags, with name labels on each. Eyeglasses and dentures in bag also.  Patient valuables envelope received on arrival to OR with #5 written on envelope. This document is included with loose "chart" from ED. Will send all belongings and valuables key to PACU on discharge from [place].

## 2013-12-12 MED ORDER — HYDROCODONE-ACETAMINOPHEN 5-325 MG PO TABS: 1.0000 | ORAL_TABLET | Freq: Four times a day (QID) | ORAL | Status: DC | PRN

## 2013-12-12 MED ORDER — AMOXICILLIN-POT CLAVULANATE 875-125 MG PO TABS: 1.0000 | ORAL_TABLET | Freq: Two times a day (BID) | ORAL | Status: DC

## 2013-12-12 NOTE — Discharge Summary (Signed)
  Patient ID: Paul Mcneil [id number] 68 y.o. 01/17/1946  Admit date: 12/11/2013  Discharge date and time: No discharge date for patient encounter.  Admitting Physician: CCS,MD  Discharge Physician: Adin Hector  Admission Diagnoses: ABD PAIN appendicitis  Discharge Diagnoses: Acute suppurative appendicitis Operations: Procedure(s): APPENDECTOMY LAPAROSCOPIC  Admission Condition: fair  Discharged Condition: good  Indication for Admission: 68 year old male with history of tobacco abuse presents with 24 hours of abdominal pain localized right lower quadrant with nausea and vomiting and some diarrhea.  Examination was notable for abdomen that was soft but tender in the right lower quadrant and healing trocar sites from prior laparoscopic cholecystectomy.WBC 14,000. CT showed thickened inflamed appendix but no abscess or obstruction.  Hospital Course: The day of admission the patient was evaluated, resuscitated, started on antibiotics, and taken to the operating room and underwent a laparoscopic appendectomy. Findings were consistent with acute appendicitis and purulent fluid in the pelvis. The patient remained stable overnight and felt much better the following morning. He was ambulatory, voiding without difficulty and tolerating a diet. His abdomen was soft, minimally tender and all of his wounds looked good. He was discharged with prescription for 7 days of [place] in the office in 3 weeks.  Consults: None  Significant Diagnostic Studies: CT scan. Surgical pathology pending.  Treatments: surgery: Laparoscopic appendectomy  Disposition: Home  Patient Instructions:    Medication List         amoxicillin-clavulanate 875-125 MG per tablet  Commonly known as:  AUGMENTIN  Take 1 tablet by mouth 2 (two) times daily.     esomeprazole 40 MG capsule  Commonly known as:  NEXIUM  Take 40 mg by mouth daily as needed (for acid reflux).     HYDROcodone-acetaminophen 5-325 MG per  tablet  Commonly known as:  NORCO  Take 1-2 tablets by mouth every 6 (six) hours as needed.     RESTORA PO  Take 1 tablet by mouth daily as needed (for regularity).        Activity: activity as tolerated Diet: low fat, low cholesterol diet Wound Care: none needed  Follow-up:  With Dr. Georgette Dover in 3 weeks.  Signed: Edsel Petrin. Dalbert Batman, M.D., FACS General and minimally invasive surgery Breast and Colorectal Surgery  12/12/2013, 9:04 AM

## 2013-12-12 NOTE — Discharge Instructions (Signed)
See above

## 2013-12-12 NOTE — Progress Notes (Signed)
Prescriptions for Norco and Augmentin given. Discharge instructions given. Wheeled to exit where daughter is waiting. Melford Aase, RN 12/12/2013 1049

## 2013-12-15 ENCOUNTER — Encounter (HOSPITAL_COMMUNITY): Payer: Self-pay | Admitting: Surgery

## 2016-07-14 DIAGNOSIS — Z760 Encounter for issue of repeat prescription: Secondary | ICD-10-CM | POA: Diagnosis not present

## 2016-07-14 DIAGNOSIS — M25512 Pain in left shoulder: Secondary | ICD-10-CM | POA: Diagnosis not present

## 2016-12-24 ENCOUNTER — Other Ambulatory Visit: Payer: Self-pay | Admitting: Family Medicine

## 2016-12-24 DIAGNOSIS — R5383 Other fatigue: Secondary | ICD-10-CM | POA: Diagnosis not present

## 2016-12-24 DIAGNOSIS — Z87891 Personal history of nicotine dependence: Secondary | ICD-10-CM | POA: Diagnosis not present

## 2016-12-24 DIAGNOSIS — R0602 Shortness of breath: Secondary | ICD-10-CM

## 2016-12-24 DIAGNOSIS — R9431 Abnormal electrocardiogram [ECG] [EKG]: Secondary | ICD-10-CM | POA: Diagnosis not present

## 2016-12-25 ENCOUNTER — Ambulatory Visit
Admission: RE | Admit: 2016-12-25 | Discharge: 2016-12-25 | Disposition: A | Discharge status: Home / Self Care | Payer: Medicare Other | Source: Ambulatory Visit | Attending: Family Medicine | Admitting: Family Medicine

## 2016-12-25 ENCOUNTER — Telehealth: Payer: Self-pay | Admitting: Cardiology

## 2016-12-25 ENCOUNTER — Telehealth: Payer: Self-pay

## 2016-12-25 DIAGNOSIS — J189 Pneumonia, unspecified organism: Secondary | ICD-10-CM | POA: Diagnosis not present

## 2016-12-25 DIAGNOSIS — R0602 Shortness of breath: Secondary | ICD-10-CM

## 2016-12-25 MED ORDER — IOPAMIDOL (ISOVUE-300) INJECTION 61%
64.0000 mL | Freq: Once | INTRAVENOUS | Status: AC | PRN
  Administered 2016-12-25: 64 mL via INTRAVENOUS

## 2016-12-25 NOTE — Telephone Encounter (Signed)
Received records from [facility] for appointment on 01/03/17 with Dr [name].  Records put with Dr Allison Quarry schedule for 01/03/17. lp

## 2016-12-25 NOTE — Telephone Encounter (Signed)
FAXED NOTES TO [facility]

## 2017-01-01 DIAGNOSIS — J439 Emphysema, unspecified: Secondary | ICD-10-CM | POA: Diagnosis not present

## 2017-01-01 DIAGNOSIS — R0602 Shortness of breath: Secondary | ICD-10-CM | POA: Diagnosis not present

## 2017-01-01 DIAGNOSIS — Z13220 Encounter for screening for lipoid disorders: Secondary | ICD-10-CM | POA: Diagnosis not present

## 2017-01-01 DIAGNOSIS — Z23 Encounter for immunization: Secondary | ICD-10-CM | POA: Diagnosis not present

## 2017-01-02 DIAGNOSIS — Z13220 Encounter for screening for lipoid disorders: Secondary | ICD-10-CM | POA: Diagnosis not present

## 2017-01-02 DIAGNOSIS — E782 Mixed hyperlipidemia: Secondary | ICD-10-CM | POA: Diagnosis not present

## 2017-01-03 ENCOUNTER — Ambulatory Visit (INDEPENDENT_AMBULATORY_CARE_PROVIDER_SITE_OTHER): Payer: Medicare Other | Admitting: Cardiology

## 2017-01-03 ENCOUNTER — Encounter: Payer: Self-pay | Admitting: Cardiology

## 2017-01-03 VITALS — BP 136/80 | HR 80 | Ht 73.0 in | Wt 248.0 lb

## 2017-01-03 DIAGNOSIS — R0609 Other forms of dyspnea: Secondary | ICD-10-CM | POA: Diagnosis not present

## 2017-01-03 DIAGNOSIS — R079 Chest pain, unspecified: Secondary | ICD-10-CM | POA: Diagnosis not present

## 2017-01-03 DIAGNOSIS — R0989 Other specified symptoms and signs involving the circulatory and respiratory systems: Secondary | ICD-10-CM | POA: Diagnosis not present

## 2017-01-03 DIAGNOSIS — R9431 Abnormal electrocardiogram [ECG] [EKG]: Secondary | ICD-10-CM | POA: Insufficient documentation

## 2017-01-03 DIAGNOSIS — I739 Peripheral vascular disease, unspecified: Secondary | ICD-10-CM | POA: Diagnosis not present

## 2017-01-03 NOTE — Patient Instructions (Addendum)
SCHEDULE ALL TESTS AT [place] Your physician has requested that you have en exercise stress myoview. For further information please visit [email or web address]. Please follow instruction sheet, as given.   Your physician has requested that you have an ankle brachial index (ABI). During this test an ultrasound and blood pressure cuff are used to evaluate the arteries that supply the arms and legs with blood. Allow thirty minutes for this exam. There are no restrictions or special instructions.  Your physician has requested that you have a lower extremity arterial exercise duplex. During this test, exercise and ultrasound are used to evaluate arterial blood flow in the legs. Allow one hour for this exam. There are no restrictions or special instructions.  Your physician has requested that you have a carotid duplex. This test is an ultrasound of the carotid arteries in your neck. It looks at blood flow through these arteries that supply the brain with blood. Allow one hour for this exam. There are no restrictions or special instructions.   Your physician recommends that you schedule a follow-up appointment in 1 MONTH WITH DR HARDING AFTER ALL TEST ARE COMPLETED.

## 2017-01-03 NOTE — Progress Notes (Signed)
PCP: [name], MD  Clinic Note: Chief Complaint  Patient presents with  . New Patient (Initial Visit)    abnormal ekg     HPI: Paul Mcneil is a 71 y.o. male who is being seen today for the evaluation of Shortness of Breath with Exertion & an abnrmal EKG at the request of Koirala, Dibas, MD. 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   He quit smoking 3 months ago along with alcohol.  Paul Mcneil was last seen on June 4th by his PCP for Dyspnea & leg fatigue while walking.  Recent Hospitalizations: n/a  Studies Personally Reviewed - (if available, images/films reviewed: From Epic Chart or Care Everywhere)  none  Interval History:  Paul Mcneil today with 2 major complaints.   He notes getting short of breath with walking. Previously he was able to walk without much difficulty, but now he gets short of breath simply walking back and forth to the mailbox. He was walking around at [employer], and he had to stop a sit down because of shortness of breath. This is the first time he has had this happen to him where he couldn't walk to the complete store. He did stop smoking about 3 months ago along with alcohol as well.  He also notes extreme leg fatigue and weakness/heaviness with walking that is alleviated by rest. He pretty much says that the combination of leg fatigue and shortness of breath has kept him from doing pretty much any exercise.   He denies any resting dyspnea or leg pain, but does note that the dyspnea and leg discomfort with exertion his gotten worse over the last couple months and most notably over the last month. Despite having exertional dyspnea, he really hasn't noticed exertional chest discomfort although he has had some tightness if he pushes beyond where he thinks he needs to sit down. No PND, orthopnea or edema. No rapid irregular heartbeats palpitations.  No  lightheadedness, dizziness, weakness or syncope/near syncope. No TIA/amaurosis fugax symptoms. No claudication.  ROS: A comprehensive was performed. Review of Systems  Constitutional: Negative for malaise/fatigue.  HENT: Negative for congestion and nosebleeds.   Respiratory: Positive for cough (Morning cough. Getting better since he quit smoking), shortness of breath and wheezing (Not as frequently.).   Cardiovascular:       Per history of present illness  Gastrointestinal: Negative for blood in stool, heartburn and melena.  Genitourinary: Negative for hematuria.  Musculoskeletal: Positive for back pain and joint pain (Knees, ankles and hips bother him).  Neurological: Negative for dizziness and focal weakness.  Psychiatric/Behavioral: Negative for depression and memory loss. The patient is not nervous/anxious and does not have insomnia.   All other systems reviewed and are negative.  PAD Screen 01/03/2017 01/03/2017  Previous PAD dx? - No  Previous surgical procedure? - No  Pain with walking? Yes Yes  Subsides with rest? - Yes  Feet/toe relief with dangling? - No  Painful, non-healing ulcers? - No  Extremities discolored? - No    I have reviewed and (if needed) personally updated the patient's problem list, medications, allergies, past medical and surgical history, social and family history.   Past Medical History:  Diagnosis Date  . Anemia 1964  . Arthritis    "touch starting to show up in the joints" (12/11/2013)  . COPD (chronic obstructive pulmonary disease) ([place])    "a  little bit" (12/11/2013)  . Former heavy tobacco smoker   . GERD (gastroesophageal reflux disease)    Dr. Michail Sermon  . History of alcohol abuse     Past Surgical History:  Procedure Laterality Date  . APPENDECTOMY  12/11/2013   "laparoscopic"  . CHOLECYSTECTOMY  ~ 2010  . LAPAROSCOPIC APPENDECTOMY N/A 12/11/2013   Procedure: APPENDECTOMY LAPAROSCOPIC;  Surgeon: Imogene Burn. Georgette Dover, MD;  Location: [facility];   Service: General;  Laterality: N/A;  . TONSILLECTOMY AND ADENOIDECTOMY  1950's    Current Meds  Medication Sig  . aspirin EC 81 MG tablet Take 81 mg by mouth daily.  Marland Kitchen atorvastatin (LIPITOR) 10 MG tablet Take 10 mg by mouth daily.  Marland Kitchen esomeprazole (NEXIUM) 40 MG capsule Take 40 mg by mouth daily as needed (for acid reflux).  . Lactobacillus Casei-Folic Acid (RESTORA RX) 60-1.25 MG CAPS Take 1 tablet by mouth as needed.    Allergies  Allergen Reactions  . Ceclor [Cefaclor] Hives and Other (See Comments)    Dropped blood pressure    Social History   Social History  . Marital status: Single    Spouse name: N/A  . Number of children: N/A  . Years of education: N/A   Social History Main Topics  . Smoking status: Former Smoker    Packs/day: 2.00    Years: 52.00    Quit date: 09/21/2016  . Smokeless tobacco: Never Used  . Alcohol use 12.0 oz/week    20 Glasses of wine per week  . Drug use: Yes  . Sexual activity: No   Other Topics Concern  . None   Social History Narrative  . None    family history includes Cancer in his father; Rheum arthritis in his mother.  Wt Readings from Last 3 Encounters:  01/03/17 248 lb (112.5 kg)  12/11/13 230 lb (104.3 kg)    PHYSICAL EXAM BP 136/80   Pulse 80   Ht 6\' 1"  (1.854 m)   Wt 248 lb (112.5 kg)   BMI 32.72 kg/m  General appearance: alert, cooperative, appears stated age, no distress. mildly obese (he lost ~5-10 lb since quitting EtOH) HEENT: [place]/AT, EOMI, MMM, anicteric sclera Neck: no adenopathy, Bilateral (L>R) carotid bruit; no JVD Lungs: Mld scattered interstitial sounds /crackles without Rales/Rhonchi.  Late Expiratory wheezing with forced breaths. Non-labored. Good Air movement. Heart: regular rate and rhythm, S1 &S2 normal, no murmur, click, rub or gallop; non-displaced PMI. Abdomen: soft, non-tender; bowel sounds normal; no masses,  no organomegaly; no HJR Extremities: extremities normal, atraumatic, no cyanosis, or  edema; Bilateral femoral bruits Pulses: 2+ and symmetric radial.  Mildly diminished pulses bilaterally. Skin: mobility and turgor normal, no evidence of bleeding or bruising, no lesions noted, temperature normal and texture normal or  Neurologic: Mental status: Alert & oriented x 3, thought content appropriate; non-focal exam.  Pleasant mood & affect. Cranial nerves: normal (II-XII grossly intact)    Adult ECG Report  Rate: 80 ;  Rhythm: normal sinus rhythm and Cannot exclude inferior MI, age undetermined with Q waves noted. Otherwise normal axis, intervals and durations.;   Narrative Interpretation: Borderline abnormal EKG with possible inferior MI, age undetermined.  Other studies Reviewed: Additional studies/ records that were reviewed today include:  Recent Labs:  n/a   ASSESSMENT / PLAN: Problem List Items Addressed This Visit    Abnormal electrocardiogram (ECG) (EKG) (Chronic)    Usually this type of finding on EKG is nonspecific, however given his history and now presenting symptoms I  do think we need to exclude CAD with his exertional dyspnea that is relatively sudden onset.  Plan: We will begin with Treadmill Myoview to exclude ischemia. This will also give [place] an assessment of his ejection fraction. If abnormal, would then consider echocardiogram.      Relevant Orders   EKG 12-Lead   Myocardial Perfusion Imaging   Bilateral carotid bruits    Bilateral carotid bruits and patient with smoking history and symptoms concerning for claudication and possibly even coronary ischemia. Plan: Check carotid Dopplers. - If his carotids are to the extent that intervention would be recommended, I would probably refer to Dr. Gwenlyn Found for both PAD and carotid disease.      Relevant Orders   EKG 12-Lead   VAS US CAROTID   Chest pain with moderate risk for cardiac etiology    Although not as prominent asymptomatic as his exertional dyspnea, he did indeed note some chest discomfort and  tightness with exertion on more detailed questioning. With the amount of progressive exertional dyspnea, I have chosen to exclude coronary ischemia with a Myoview over simply checking a 2-D echo.      Claudication of both lower extremities (HCC)    Bilateral leg weakness and heaviness with walking and femoral bruits noted on exam. Concerning symptoms for claudication. In addition to evaluating for coronary ischemia, we will order abdominal and lower extremity arterial Dopplers. With lower short arterial Dopplers, we first checked ABIs if these are normal then no further studies done, however ABIs are abnormal, complete duplex is done.  If abnormal, will be referred to our vascular cardiologist either Dr. Joseph Pierini. Gwenlyn Found or Dr. Saunders Revel       Relevant Orders   EKG 12-Lead   VAS [place] ABI WITH/WO TBI   VAS [place] LOWER EXTREMITY ARTERIAL DUPLEX   Exertional dyspnea - Primary    Exertional dyspnea could very well be related to COPD from long-term smoking, however the fact that this is relatively new onset for him to this extent, we need to exclude ischemia. He did have some chest tightness albeit not all the time which would also be concerning for possible anginal equivalent. Plan: We Will Begin with Treadmill Myoview; pending results consider echocardiogram versus moving forward to cardiac catheterization.      Relevant Orders   EKG 12-Lead   Myocardial Perfusion Imaging      Current medicines are reviewed at length with the patient today. (+/- concerns) n/a The following changes have been made: n/a  Patient Instructions  SCHEDULE ALL TESTS AT [facility] physician has requested that you have en exercise stress myoview. For further information please visit [email or web address]. Please follow instruction sheet, as given.   Your physician has requested that you have an ankle brachial index (ABI). During this test an ultrasound and blood pressure cuff are used to evaluate  the arteries that supply the arms and legs with blood. Allow thirty minutes for this exam. There are no restrictions or special instructions.  Your physician has requested that you have a lower extremity arterial exercise duplex. During this test, exercise and ultrasound are used to evaluate arterial blood flow in the legs. Allow one hour for this exam. There are no restrictions or special instructions.  Your physician has requested that you have a carotid duplex. This test is an ultrasound of the carotid arteries in your neck. It looks at blood flow through these arteries that supply the brain with blood. Allow one hour for  this exam. There are no restrictions or special instructions.   Your physician recommends that you schedule a follow-up appointment in 1 MONTH WITH DR HARDING AFTER ALL TEST ARE COMPLETED.    Studies Ordered:   Orders Placed This Encounter  Procedures  . Myocardial Perfusion Imaging  . EKG 12-Lead      Glenetta Hew, M.D., M.S. Interventional Cardiologist   Pager # [phone] Phone # [phone] [address]. [place], [place] [address]

## 2017-01-05 ENCOUNTER — Encounter: Payer: Self-pay | Admitting: Cardiology

## 2017-01-05 DIAGNOSIS — I200 Unstable angina: Secondary | ICD-10-CM | POA: Insufficient documentation

## 2017-01-05 NOTE — Assessment & Plan Note (Signed)
Bilateral carotid bruits and patient with smoking history and symptoms concerning for claudication and possibly even coronary ischemia. Plan: Check carotid Dopplers. - If his carotids are to the extent that intervention would be recommended, I would probably refer to Dr. Gwenlyn Found for both PAD and carotid disease.

## 2017-01-05 NOTE — Assessment & Plan Note (Signed)
Although not as prominent asymptomatic as his exertional dyspnea, he did indeed note some chest discomfort and tightness with exertion on more detailed questioning. With the amount of progressive exertional dyspnea, I have chosen to exclude coronary ischemia with a Myoview over simply checking a 2-D echo.

## 2017-01-05 NOTE — Assessment & Plan Note (Signed)
Usually this type of finding on EKG is nonspecific, however given his history and now presenting symptoms I do think we need to exclude CAD with his exertional dyspnea that is relatively sudden onset.  Plan: We will begin with Treadmill Myoview to exclude ischemia. This will also give [place] an assessment of his ejection fraction. If abnormal, would then consider echocardiogram.

## 2017-01-05 NOTE — Assessment & Plan Note (Signed)
Exertional dyspnea could very well be related to COPD from long-term smoking, however the fact that this is relatively new onset for him to this extent, we need to exclude ischemia. He did have some chest tightness albeit not all the time which would also be concerning for possible anginal equivalent. Plan: We Will Begin with Treadmill Myoview; pending results consider echocardiogram versus moving forward to cardiac catheterization.

## 2017-01-05 NOTE — Assessment & Plan Note (Addendum)
Bilateral leg weakness and heaviness with walking and femoral bruits noted on exam. Concerning symptoms for claudication. In addition to evaluating for coronary ischemia, we will order abdominal and lower extremity arterial Dopplers. With lower short arterial Dopplers, we first checked ABIs if these are normal then no further studies done, however ABIs are abnormal, complete duplex is done.  If abnormal, will be referred to our vascular cardiologist either Dr. Joseph Pierini. Gwenlyn Found or Dr. Saunders Revel

## 2017-01-09 ENCOUNTER — Telehealth (HOSPITAL_COMMUNITY): Payer: Self-pay

## 2017-01-09 NOTE — Telephone Encounter (Signed)
Encounter complete. 

## 2017-01-10 ENCOUNTER — Encounter: Payer: Self-pay | Admitting: Internal Medicine

## 2017-01-10 ENCOUNTER — Ambulatory Visit (INDEPENDENT_AMBULATORY_CARE_PROVIDER_SITE_OTHER): Payer: Medicare Other | Admitting: Internal Medicine

## 2017-01-10 ENCOUNTER — Other Ambulatory Visit (INDEPENDENT_AMBULATORY_CARE_PROVIDER_SITE_OTHER): Payer: Medicare Other

## 2017-01-10 VITALS — BP 162/84 | HR 96 | Ht 74.0 in | Wt 250.0 lb

## 2017-01-10 DIAGNOSIS — J449 Chronic obstructive pulmonary disease, unspecified: Secondary | ICD-10-CM

## 2017-01-10 DIAGNOSIS — R911 Solitary pulmonary nodule: Secondary | ICD-10-CM | POA: Diagnosis not present

## 2017-01-10 DIAGNOSIS — R0609 Other forms of dyspnea: Secondary | ICD-10-CM

## 2017-01-10 LAB — BASIC METABOLIC PANEL
BUN: 23 mg/dL (ref 6–23)
CALCIUM: 9.4 mg/dL (ref 8.4–10.5)
CHLORIDE: 105 meq/L (ref 96–112)
CO2: 24 mEq/L (ref 19–32)
Creatinine, Ser: 1.8 mg/dL — ABNORMAL HIGH (ref 0.40–1.50)
GFR: 39.78 mL/min — ABNORMAL LOW (ref >60.00)
Glucose, Bld: 200 mg/dL — ABNORMAL HIGH (ref 70–99)
Potassium: 3.9 mEq/L (ref 3.5–5.1)
Sodium: 137 mEq/L (ref 135–145)

## 2017-01-10 LAB — SEDIMENTATION RATE: Sed Rate: 43 mm/hr — ABNORMAL HIGH (ref 0–20)

## 2017-01-10 LAB — TSH: TSH: 2.29 u[IU]/mL (ref 0.35–4.50)

## 2017-01-10 LAB — BRAIN NATRIURETIC PEPTIDE: PRO B NATRI PEPTIDE: 58 pg/mL (ref 0.0–100.0)

## 2017-01-10 NOTE — Progress Notes (Signed)
Subjective:     Patient ID: Paul Mcneil, male   DOB: [birth year],     MRN: [id number]  HPI  33 yowm quit smoking 09/3016 with noticeable improvement in chest congestion esp supine but worse doe  > [place] eval with cxr > abn so referred to pulmonary clinic 01/10/2017 by Dr  Raliegh Ip   Had fob for ? L  Lung abscess related to poor dentition while in [place] around 1990 but was heavy drinker until 08/2016     01/10/2017 1st [facility] office visit/ Paul Mcneil   Chief Complaint  Patient presents with  . Pulmonary Consult    Referred by Dr. [name] for eval of lung lesion.  Pt c/o DOE for the past 6 months. He gets winded walking to his mailbox.    onset insidious x 6 m, mildly progressive doe x mailbox is 50 ft flat  And struggles to get there now due to sob but neg ex cp rx spiriva but hasn't started yet    No obvious day to day or daytime variability or assoc excess/ purulent sputum or mucus plugs or hemoptysis or cp or chest tightness, subjective wheeze or overt sinus or hb symptoms. No unusual exp hx or h/o childhood pna/ asthma or knowledge of premature birth.  Sleeping ok without nocturnal  or early am exacerbation  of respiratory  c/o's or need for noct saba. Also denies any obvious fluctuation of symptoms with weather or environmental changes or other aggravating or alleviating factors except as outlined above   Current Medications, Allergies, Complete Past Medical History, Past Surgical History, Family History, and Social History were reviewed in [employer] record.  ROS  The following are not active complaints unless bolded sore throat, dysphagia, dental problems, itching, sneezing,  nasal congestion or excess/ purulent secretions, ear ache,   fever, chills, sweats, unintended wt loss, classically pleuritic or exertional cp,  orthopnea pnd or leg swelling, presyncope, palpitations, abdominal pain, anorexia, nausea, vomiting, diarrhea  or change in bowel or  bladder habits, change in stools or urine, dysuria,hematuria,  rash, arthralgias, visual complaints, headache, numbness, weakness or ataxia or problems with walking or coordination,  change in mood/affect or memory.                     Review of Systems     Objective:   Physical Exam Obese amb wm nad  Wt Readings from Last 3 Encounters:  01/10/17 250 lb (113.4 kg)  01/03/17 248 lb (112.5 kg)  12/11/13 230 lb (104.3 kg)    Vital signs reviewed  - Note on arrival 02 sats  100% on RA      HEENT: nl  turbinates bilaterally, and oropharynx. Nl external ear canals without cough reflex - edentulous   NECK :  without JVD/Nodes/TM/ nl carotid upstrokes bilaterally   LUNGS: no acc muscle use,  Nl contour chest which is clear to A and P bilaterally without cough on insp or exp maneuvers   CV:  RRR  no s3 or murmur or increase in P2, and no edema   ABD:  soft and nontender with nl inspiratory excursion in the supine position. No bruits or organomegaly appreciated, bowel sounds nl  MS:  Nl gait/ ext warm without deformities, calf tenderness, cyanosis or clubbing No obvious joint restrictions   SKIN: warm and dry without lesions    NEURO:  alert, approp, nl sensorium with  no motor or cerebellar deficits apparent.  I personally reviewed images and agree with radiology impression as follows:   Chest CT w contrast 12/25/16 1. No focal pneumonia visualized. 2. Moderate emphysema. 10 mm small cavitary lesion in the left upper lobe with surrounding mild amount of soft tissue density. Findings could be secondary to small focus of cavitary infection versus cavitary nodule.   Lab Results  Component Value Date   ESRSEDRATE 43 (H) 01/10/2017       BNP                        58                                                                    01/10/2017       EOS                        0.0                                                                   12/11/13       HCO3                       24                                                                    01/10/2017   Labs ordered 01/10/2017  Quant gold tb   Lab Results  Component Value Date   TSH 2.29 01/10/2017       Assessment:

## 2017-01-10 NOTE — Patient Instructions (Addendum)
Please remember to go to the lab department downstairs in the basement  for your tests - we will call you with the results when they are available.  You have mild copd and may benefit from spiriva like high octane fuel you will need to take it daily x for at least several weeks.  We will set you up for follow up CT scan for 03/27/17 and follow up here the next office day

## 2017-01-11 DIAGNOSIS — J449 Chronic obstructive pulmonary disease, unspecified: Secondary | ICD-10-CM | POA: Insufficient documentation

## 2017-01-11 DIAGNOSIS — R911 Solitary pulmonary nodule: Secondary | ICD-10-CM | POA: Insufficient documentation

## 2017-01-11 DIAGNOSIS — J439 Emphysema, unspecified: Secondary | ICD-10-CM | POA: Insufficient documentation

## 2017-01-11 NOTE — Assessment & Plan Note (Signed)
-   Spirometry 01/10/2017  FEV1 2.54 (67%)  Ratio 62    When respiratory symptoms begin or become refractory well after a patient reports complete smoking cessation,  Especially when this wasn't the case while they were smoking, a red flag is raised based on the work of Dr Kris Mouton which states:  if you quit smoking when your best day FEV1 is still well preserved (which with fev1 >> 2 liters is still true) it is highly unlikely you will progress to severe disease.  That is to say, once the smoking stops,  the symptoms should not suddenly erupt or markedly worsen.  If so, the differential diagnosis should include  obesity/deconditioning,  LPR/Reflux/Aspiration syndromes,  occult CHF, or  especially side effect of medications commonly used in this population.

## 2017-01-11 NOTE — Assessment & Plan Note (Signed)
01/10/2017  Walked RA x 3 laps @ 185 ft each stopped due to  End of study, nl pace, no sob or desat    - Spirometry 01/10/2017  FEV1 2.54 (67%)  Ratio 62   Symptoms are markedly disproportionate to objective findings and not clear this is actually much of a  lung problem but pt does appear to have difficult to sort out respiratory symptoms of unknown origin for which  DDX  = almost all start with A and  include Adherence, Ace Inhibitors, Acid Reflux, Active Sinus Disease, Alpha 1 Antitripsin deficiency, Anxiety masquerading as Airways dz,  ABPA,  Allergy(esp in young), Aspiration (esp in elderly), Adverse effects of meds,  Active smokers, A bunch of PE's (a small clot burden can't cause this syndrome unless there is already severe underlying pulm or vascular dz with poor reserve) plus two Bs  = Bronchiectasis and Beta blocker use..and one C= CHF     Adherence is always the initial "prime suspect" and is a multilayered concern that requires a "trust but verify" approach in every patient - starting with knowing how to use medications, especially inhalers, correctly, keeping up with refills and understanding the fundamental difference between maintenance and prns vs those medications only taken for a very short course and then stopped and not refilled.  - has not started spiriva and doesn't know how to use it - rec with all meds in hand using a trust but verify approach to confirm accurate Medication  Reconciliation The principal here is that until we are certain that the  patients are doing what we've asked, it makes no sense to ask them to do more.    ? Allergy/ asthma > seems very unlikely now if didn't have it while smoking  ? Active smoking > denies  ? Anxiety/ depression/ deconditioning > usually dx of exclusion  ? Chf/ihd > w/u in progress   [place] with me if he starts spiriva p cards w/u complete but will need to return here for training on how to use it.

## 2017-01-11 NOTE — Assessment & Plan Note (Signed)
Prior h/o L lung abscess in [place] in the 1990s recs not available   Chest CT w contrast 12/25/16 1. No focal pneumonia visualized. 2. Moderate emphysema. 10 mm small cavitary lesion in the left upper lobe with surrounding mild amount of soft tissue density. Findings could be secondary to small focus of cavitary infection versus cavitary nodule.  - Quant TB 01/10/2017 >>>  CT results reviewed with pt >>> Too small for PET or bx, not suspicious enough for excisional bx > really only option for now is follow the Fleischner society guidelines as rec by radiology= 3 months f/u approp   Total time devoted to counseling  > 50 % of initial 60 min office visit:  review case with pt/daughter  discussion of options/alternatives/ personally creating written customized instructions  in presence of pt  then going over those specific  Instructions directly with the pt including how to use all of the meds but in particular covering each new medication in detail and the difference between the maintenance= "automatic" meds and the prns using an action plan format for the latter (If this problem/symptom => do that organization reading Left to right).  Please see AVS from this visit for a full list of these instructions which I personally wrote for this pt and  are unique to this visit.

## 2017-01-12 LAB — QUANTIFERON TB GOLD ASSAY (BLOOD)
Interferon Gamma Release Assay: NEGATIVE
MITOGEN-NIL SO: 8.15 [IU]/mL
Quantiferon Nil Value: 0.06 IU/mL
Quantiferon Tb Ag Minus Nil Value: <0 IU/mL

## 2017-01-16 ENCOUNTER — Ambulatory Visit (HOSPITAL_COMMUNITY)
Admission: RE | Admit: 2017-01-16 | Discharge: 2017-01-16 | Disposition: A | Discharge status: Home / Self Care | Payer: Medicare Other | Source: Ambulatory Visit | Attending: Internal Medicine | Admitting: Internal Medicine

## 2017-01-16 DIAGNOSIS — R5383 Other fatigue: Secondary | ICD-10-CM | POA: Diagnosis not present

## 2017-01-16 DIAGNOSIS — Z6832 Body mass index (BMI) 32.0-32.9, adult: Secondary | ICD-10-CM | POA: Diagnosis not present

## 2017-01-16 DIAGNOSIS — R9439 Abnormal result of other cardiovascular function study: Secondary | ICD-10-CM | POA: Diagnosis not present

## 2017-01-16 DIAGNOSIS — R9431 Abnormal electrocardiogram [ECG] [EKG]: Secondary | ICD-10-CM | POA: Diagnosis not present

## 2017-01-16 DIAGNOSIS — R079 Chest pain, unspecified: Secondary | ICD-10-CM | POA: Insufficient documentation

## 2017-01-16 DIAGNOSIS — R0609 Other forms of dyspnea: Secondary | ICD-10-CM | POA: Diagnosis not present

## 2017-01-16 DIAGNOSIS — Z87891 Personal history of nicotine dependence: Secondary | ICD-10-CM | POA: Diagnosis not present

## 2017-01-16 DIAGNOSIS — E663 Overweight: Secondary | ICD-10-CM | POA: Insufficient documentation

## 2017-01-16 DIAGNOSIS — J439 Emphysema, unspecified: Secondary | ICD-10-CM | POA: Diagnosis not present

## 2017-01-16 LAB — MYOCARDIAL PERFUSION IMAGING
CHL CUP NUCLEAR SSS: 12
LV dias vol: 115 mL (ref 62–150)
LV sys vol: 64 mL
NUC STRESS TID: 1.1
Peak HR: 117 {beats}/min
Rest HR: 70 {beats}/min
SDS: 6
SRS: 6

## 2017-01-16 MED ORDER — TECHNETIUM TC 99M TETROFOSMIN IV KIT
31.4000 | PACK | Freq: Once | INTRAVENOUS | Status: AC | PRN
  Administered 2017-01-16: 31.4 via INTRAVENOUS
  Filled 2017-01-16: qty 32

## 2017-01-16 MED ORDER — REGADENOSON 0.4 MG/5ML IV SOLN
0.4000 mg | Freq: Once | INTRAVENOUS | Status: AC
  Administered 2017-01-16: 0.4 mg via INTRAVENOUS

## 2017-01-16 MED ORDER — TECHNETIUM TC 99M TETROFOSMIN IV KIT
9.6000 | PACK | Freq: Once | INTRAVENOUS | Status: AC | PRN
  Administered 2017-01-16: 9.6 via INTRAVENOUS
  Filled 2017-01-16: qty 10

## 2017-01-20 HISTORY — PX: TRANSTHORACIC ECHOCARDIOGRAM: SHX275

## 2017-01-24 ENCOUNTER — Encounter: Payer: Self-pay | Admitting: Cardiology

## 2017-01-24 ENCOUNTER — Ambulatory Visit (INDEPENDENT_AMBULATORY_CARE_PROVIDER_SITE_OTHER): Payer: Medicare Other | Admitting: Cardiology

## 2017-01-24 VITALS — BP 142/82 | HR 80 | Ht 73.0 in | Wt 246.6 lb

## 2017-01-24 DIAGNOSIS — Z01818 Encounter for other preprocedural examination: Secondary | ICD-10-CM

## 2017-01-24 DIAGNOSIS — R739 Hyperglycemia, unspecified: Secondary | ICD-10-CM

## 2017-01-24 DIAGNOSIS — R0989 Other specified symptoms and signs involving the circulatory and respiratory systems: Secondary | ICD-10-CM | POA: Diagnosis not present

## 2017-01-24 DIAGNOSIS — J449 Chronic obstructive pulmonary disease, unspecified: Secondary | ICD-10-CM

## 2017-01-24 DIAGNOSIS — R9439 Abnormal result of other cardiovascular function study: Secondary | ICD-10-CM | POA: Insufficient documentation

## 2017-01-24 DIAGNOSIS — R079 Chest pain, unspecified: Secondary | ICD-10-CM

## 2017-01-24 DIAGNOSIS — N289 Disorder of kidney and ureter, unspecified: Secondary | ICD-10-CM

## 2017-01-24 DIAGNOSIS — I739 Peripheral vascular disease, unspecified: Secondary | ICD-10-CM

## 2017-01-24 NOTE — Assessment & Plan Note (Signed)
Random BS was 200- will check HGb A1c

## 2017-01-24 NOTE — Assessment & Plan Note (Signed)
Pt has exertional angina with an abnormal Myoview

## 2017-01-24 NOTE — Assessment & Plan Note (Signed)
Myoview suggests inferior MI with ischemia

## 2017-01-24 NOTE — Assessment & Plan Note (Signed)
Followed by Dr Wert 

## 2017-01-24 NOTE — Progress Notes (Signed)
01/24/2017 Paul Mcneil   06/09/1946  [id number]  Primary Physician Paul Amel, MD Primary Cardiologist: Dr Paul Mcneil   HPI:  71 y.o. male who was seen by Dr Paul Mcneil for the evaluation of shortness of breath with exertion, exertional chest discomfort, and an abnrmal EKG. He was referred by Paul Amel, MD. 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 The patient gave a history of getting short of breath with walking. Previously he was able to walk without much difficulty, but now he gets short of breath simply walking back and forth to the mailbox. He was walking around at [place] the other week and he had to stop a sit down because of shortness of breath. He also complains of leg fatigue when walking worrisome for claudication. On exam he has evidence of PVD with carotid bruits bilaterally. His EKG was abnormal with inferior Qs. Work up revealed emphysema by CT as well a LUL nodule. His labs reveal a SCr of 1.8 and a random BS was 200. Myoview done 01/16/17 shows an EF of 45% with basal to mid inferior akinesis. He hasp a partially reversible medium-sized, moderate intensity basal to apical inferior defect that suggests infarction with peri-infarct ischemia. He is in the office today with his daughter to discuss heart catheterzation. He denies any rest symptoms.    Current Outpatient Prescriptions  Medication Sig Dispense Refill  . aspirin EC 81 MG tablet Take 81 mg by mouth daily.    Marland Kitchen atorvastatin (LIPITOR) 10 MG tablet Take 10 mg by mouth daily.    Marland Kitchen esomeprazole (NEXIUM) 40 MG capsule Take 40 mg by mouth daily as needed (for acid reflux).    . Lactobacillus Casei-Folic Acid (RESTORA RX) 60-1.25 MG CAPS Take 1 tablet by mouth as needed.     No current facility-administered medications for this visit.     Allergies  Allergen Reactions  . Ceclor [Cefaclor] Hives and Other (See Comments)    Dropped  blood pressure    Past Medical History:  Diagnosis Date  . Anemia 1964  . Arthritis    "touch starting to show up in the joints" (12/11/2013)  . COPD (chronic obstructive pulmonary disease) ([place])    "a little bit" (12/11/2013)  . Former heavy tobacco smoker   . GERD (gastroesophageal reflux disease)    Dr. Michail Mcneil  . History of alcohol abuse     Social History   Social History  . Marital status: Divorced    Spouse name: N/A  . Number of children: N/A  . Years of education: N/A   Occupational History  . 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 Smoking status: Former Smoker    Packs/day: 2.00    Years: 52.00    Types: Cigarettes    Quit date: 09/21/2016  . Smokeless tobacco: Never Used  . Alcohol use 12.0 oz/week    20 Glasses of wine per week  . Drug use: Yes  . Sexual activity: No   Other Topics Concern  . 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     He lives by himself, having been divorced. He has only the one daughter and 2  grandchildren.   Does not routinely exercise.      He quit smoking along with alcohol - March 2018     Family History  Problem Relation Age of Onset  . Rheum arthritis Mother   . Cancer Father        BONE MARROW     Review of Systems: General: negative for chills, fever, night sweats or weight changes.  Cardiovascular: negative for chest pain, dyspnea on exertion, edema, orthopnea, palpitations, paroxysmal nocturnal dyspnea or shortness of breath Dermatological: negative for rash Respiratory: negative for cough or wheezing Urologic: negative for hematuria Abdominal: negative for nausea, vomiting, diarrhea, bright red blood per rectum, melena, or hematemesis Neurologic: negative for visual changes, syncope, or dizziness All other systems reviewed and are otherwise negative  except as noted above.    Blood pressure (!) 142/82, pulse 80, height 6\' 1"  (1.854 m), weight 246 lb 9.6 oz (111.9 kg).  General appearance: alert, cooperative, no distress and mildly obese Neck: no JVD and bilateral carotid bruit Lungs: clear to auscultation bilaterally Heart: regular rate and rhythm Abdomen: soft, non-tender; bowel sounds normal; no masses,  no organomegaly Extremities: extremities normal, atraumatic, no cyanosis or edema Pulses: PT pulses 1+ Skin: Skin color, texture, turgor normal. No rashes or lesions Neurologic: Grossly normal   ASSESSMENT AND PLAN:   Chest pain with high risk for cardiac etiology Pt has exertional angina with an abnormal Myoview  Abnormal nuclear stress test Myoview suggests inferior MI with ischemia  COPD GOLD II Followed by Dr Paul Mcneil  Bilateral carotid bruits Caroitid dopplers scheduled for 7/19  Claudication of both lower extremities ([place]) For dopplers 7/19  Renal insufficiency Chronicity unclear- SCr was 1.8 on 01/10/17  Elevated blood sugar Random BS was 200- will check HGb A1c   PLAN  Discussed with Dr Paul Mcneil- plan admit for Rt and Lt cath next week. Will admit to short stay early in the am and hydrate, cath later in the day. The patient understands that risks included but are not limited to stroke (1 in 1000), death (1 in 48), kidney failure [usually temporary] (1 in 500), bleeding (1 in 200), allergic reaction [possibly serious] (1 in 200).  The patient understands and agrees to proceed.   [name] PA-C 01/24/2017 3:29 PM

## 2017-01-24 NOTE — Assessment & Plan Note (Signed)
For dopplers 7/19

## 2017-01-24 NOTE — Assessment & Plan Note (Signed)
Chronicity unclear- SCr was 1.8 on 01/10/17

## 2017-01-24 NOTE — Addendum Note (Signed)
Addended by: Erlene Quan on: 01/24/2017 03:33 PM   Modules accepted: Orders, SmartSet

## 2017-01-24 NOTE — Assessment & Plan Note (Signed)
Caroitid dopplers scheduled for 7/19

## 2017-01-24 NOTE — Patient Instructions (Signed)
   [facility] [address] [place] [address] Dept: [phone] Loc: [phone]  VZCHYIFOYDX AJOI  01/24/2017  You are scheduled for a Heart Catherization on Wednesday, July 11th @ 1:00 pm with Dr. Shelva Majestic.  1. Please arrive at the [facility] (Main Entrance A) at [facility]: [address] [place], [place] [address] at 7:00AM (two hours before your procedure to ensure your preparation). Free valet parking service is available.   Special note: Every effort is made to have your procedure done on time. Please understand that emergencies sometimes delay scheduled procedures.  2. Diet: DO NOT EAT OR DRINK AFTER MIDNIGHT THE NIGHT BEFORE THE PROCEDURE  3. Labs: TODAY  4. Medication instructions in preparation for your procedure:    NO MEDICATION ADJUSTMENTS NEEDED   5. Plan for one night stay--bring personal belongings. 6. Bring a current list of your medications and current insurance cards. 7. You MUST have a responsible person to drive you home. 8. Someone MUST be with you the first 24 hours after you arrive home or your discharge will be delayed. 9. Please wear clothes that are easy to get on and off and wear slip-on shoes.  Thank you for allowing [place] to care for you!   -- [facility]

## 2017-01-25 LAB — BASIC METABOLIC PANEL
BUN/Creatinine Ratio: 12 (ref 10–24)
BUN: 22 mg/dL (ref 8–27)
CO2: 21 mmol/L (ref 20–29)
Calcium: 9.5 mg/dL (ref 8.6–10.2)
Chloride: 100 mmol/L (ref 96–106)
Creatinine, Ser: 1.91 mg/dL — ABNORMAL HIGH (ref 0.76–1.27)
GFR calc Af Amer: 40 mL/min/{1.73_m2} — ABNORMAL LOW (ref >59)
GFR calc non Af Amer: 35 mL/min/{1.73_m2} — ABNORMAL LOW (ref >59)
Glucose: 322 mg/dL — ABNORMAL HIGH (ref 65–99)
Potassium: 4.7 mmol/L (ref 3.5–5.2)
Sodium: 137 mmol/L (ref 134–144)

## 2017-01-25 LAB — CBC
Hematocrit: 39.2 % (ref 37.5–51.0)
Hemoglobin: 12.9 g/dL — ABNORMAL LOW (ref 13.0–17.7)
MCH: 29.3 pg (ref 26.6–33.0)
MCHC: 32.9 g/dL (ref 31.5–35.7)
MCV: 89 fL (ref 79–97)
Platelets: 242 10*3/uL (ref 150–379)
RBC: 4.41 x10E6/uL (ref 4.14–5.80)
RDW: 13.5 % (ref 12.3–15.4)
WBC: 6 10*3/uL (ref 3.4–10.8)

## 2017-01-25 LAB — APTT: aPTT: 24 s (ref 24–33)

## 2017-01-25 LAB — PROTIME-INR
INR: 0.9 (ref 0.8–1.2)
Prothrombin Time: 10.1 s (ref 9.1–12.0)

## 2017-01-25 LAB — HEMOGLOBIN A1C
Est. average glucose Bld gHb Est-mCnc: 203 mg/dL
Hgb A1c MFr Bld: 8.7 % — ABNORMAL HIGH (ref 4.8–5.6)

## 2017-01-29 ENCOUNTER — Telehealth: Payer: Self-pay

## 2017-01-29 NOTE — Telephone Encounter (Signed)
Patient contacted pre-catheterization at [facility] scheduled for:  01/30/2017 @ 1300 Verified arrival time and place:  NT @ 0700 for IV fluid hydration prior to procedure Confirmed AM meds to be taken pre-cath with sip of water:  Notified to take ASA prior to arrival Confirmed patient has responsible person to drive home post procedure and observe patient for 24 hours:  daughter Addl concerns:  Pt asked about discharging same day.  Notified Pt that cardiologist would decide after procedure.

## 2017-01-30 ENCOUNTER — Inpatient Hospital Stay (HOSPITAL_COMMUNITY)
Admission: RE | Admit: 2017-01-30 | Discharge: 2017-02-01 | DRG: 287 | Disposition: A | Discharge status: Home / Self Care | Payer: Medicare Other | Source: Ambulatory Visit | Attending: Cardiology | Admitting: Cardiology

## 2017-01-30 ENCOUNTER — Encounter (HOSPITAL_COMMUNITY)
Admission: RE | Disposition: A | Discharge status: Home / Self Care | Payer: Self-pay | Source: Ambulatory Visit | Attending: Cardiology

## 2017-01-30 ENCOUNTER — Encounter (HOSPITAL_COMMUNITY): Payer: Self-pay

## 2017-01-30 DIAGNOSIS — I200 Unstable angina: Secondary | ICD-10-CM | POA: Diagnosis not present

## 2017-01-30 DIAGNOSIS — Z87891 Personal history of nicotine dependence: Secondary | ICD-10-CM

## 2017-01-30 DIAGNOSIS — E1151 Type 2 diabetes mellitus with diabetic peripheral angiopathy without gangrene: Secondary | ICD-10-CM | POA: Diagnosis present

## 2017-01-30 DIAGNOSIS — E1165 Type 2 diabetes mellitus with hyperglycemia: Secondary | ICD-10-CM | POA: Diagnosis present

## 2017-01-30 DIAGNOSIS — Z23 Encounter for immunization: Secondary | ICD-10-CM

## 2017-01-30 DIAGNOSIS — R911 Solitary pulmonary nodule: Secondary | ICD-10-CM | POA: Diagnosis present

## 2017-01-30 DIAGNOSIS — I6523 Occlusion and stenosis of bilateral carotid arteries: Secondary | ICD-10-CM | POA: Diagnosis not present

## 2017-01-30 DIAGNOSIS — R079 Chest pain, unspecified: Secondary | ICD-10-CM | POA: Diagnosis not present

## 2017-01-30 DIAGNOSIS — I2510 Atherosclerotic heart disease of native coronary artery without angina pectoris: Secondary | ICD-10-CM | POA: Diagnosis not present

## 2017-01-30 DIAGNOSIS — E119 Type 2 diabetes mellitus without complications: Secondary | ICD-10-CM

## 2017-01-30 DIAGNOSIS — I25110 Atherosclerotic heart disease of native coronary artery with unstable angina pectoris: Principal | ICD-10-CM | POA: Diagnosis present

## 2017-01-30 DIAGNOSIS — E785 Hyperlipidemia, unspecified: Secondary | ICD-10-CM | POA: Diagnosis present

## 2017-01-30 DIAGNOSIS — Z7982 Long term (current) use of aspirin: Secondary | ICD-10-CM

## 2017-01-30 DIAGNOSIS — I779 Disorder of arteries and arterioles, unspecified: Secondary | ICD-10-CM

## 2017-01-30 DIAGNOSIS — I129 Hypertensive chronic kidney disease with stage 1 through stage 4 chronic kidney disease, or unspecified chronic kidney disease: Secondary | ICD-10-CM | POA: Diagnosis present

## 2017-01-30 DIAGNOSIS — R9439 Abnormal result of other cardiovascular function study: Secondary | ICD-10-CM | POA: Diagnosis not present

## 2017-01-30 DIAGNOSIS — N184 Chronic kidney disease, stage 4 (severe): Secondary | ICD-10-CM

## 2017-01-30 DIAGNOSIS — N183 Chronic kidney disease, stage 3 (moderate): Secondary | ICD-10-CM | POA: Diagnosis present

## 2017-01-30 DIAGNOSIS — J439 Emphysema, unspecified: Secondary | ICD-10-CM | POA: Diagnosis present

## 2017-01-30 DIAGNOSIS — Z01810 Encounter for preprocedural cardiovascular examination: Secondary | ICD-10-CM | POA: Diagnosis not present

## 2017-01-30 DIAGNOSIS — K219 Gastro-esophageal reflux disease without esophagitis: Secondary | ICD-10-CM | POA: Diagnosis present

## 2017-01-30 DIAGNOSIS — Z79899 Other long term (current) drug therapy: Secondary | ICD-10-CM

## 2017-01-30 DIAGNOSIS — J449 Chronic obstructive pulmonary disease, unspecified: Secondary | ICD-10-CM | POA: Diagnosis present

## 2017-01-30 DIAGNOSIS — E1122 Type 2 diabetes mellitus with diabetic chronic kidney disease: Secondary | ICD-10-CM | POA: Diagnosis present

## 2017-01-30 DIAGNOSIS — M1990 Unspecified osteoarthritis, unspecified site: Secondary | ICD-10-CM | POA: Diagnosis present

## 2017-01-30 DIAGNOSIS — E1169 Type 2 diabetes mellitus with other specified complication: Secondary | ICD-10-CM | POA: Diagnosis present

## 2017-01-30 DIAGNOSIS — I739 Peripheral vascular disease, unspecified: Secondary | ICD-10-CM

## 2017-01-30 HISTORY — DX: Atherosclerotic heart disease of native coronary artery without angina pectoris: I25.10

## 2017-01-30 HISTORY — DX: Type 2 diabetes mellitus without complications: E11.9

## 2017-01-30 HISTORY — PX: RIGHT/LEFT HEART CATH AND CORONARY ANGIOGRAPHY: CATH118266

## 2017-01-30 HISTORY — DX: Occlusion and stenosis of unspecified carotid artery: I65.29

## 2017-01-30 HISTORY — DX: Hyperlipidemia, unspecified: E78.5

## 2017-01-30 LAB — POCT I-STAT 3, ART BLOOD GAS (G3+)
Acid-base deficit: 2 mmol/L (ref 0.0–2.0)
Bicarbonate: 22.7 mmol/L (ref 20.0–28.0)
O2 Saturation: 98 %
TCO2: 24 mmol/L (ref 0–100)
pCO2 arterial: 37 mmHg (ref 32.0–48.0)
pH, Arterial: 7.396 (ref 7.350–7.450)
pO2, Arterial: 105 mmHg (ref 83.0–108.0)

## 2017-01-30 LAB — POCT I-STAT 3, VENOUS BLOOD GAS (G3P V)
Acid-Base Excess: 1 mmol/L (ref 0.0–2.0)
Bicarbonate: 26.3 mmol/L (ref 20.0–28.0)
O2 Saturation: 68 %
TCO2: 28 mmol/L (ref 0–100)
pCO2, Ven: 42.9 mmHg — ABNORMAL LOW (ref 44.0–60.0)
pH, Ven: 7.395 (ref 7.250–7.430)
pO2, Ven: 35 mmHg (ref 32.0–45.0)

## 2017-01-30 LAB — GLUCOSE, CAPILLARY
Glucose-Capillary: 266 mg/dL — ABNORMAL HIGH (ref 65–99)
Glucose-Capillary: 218 mg/dL — ABNORMAL HIGH (ref 65–99)
Glucose-Capillary: 242 mg/dL — ABNORMAL HIGH (ref 65–99)

## 2017-01-30 SURGERY — RIGHT/LEFT HEART CATH AND CORONARY ANGIOGRAPHY
Anesthesia: LOCAL

## 2017-01-30 MED ORDER — HEPARIN (PORCINE) IN NACL 2-0.9 UNIT/ML-% IJ SOLN
INTRAMUSCULAR | Status: AC
  Filled 2017-01-30: qty 1000

## 2017-01-30 MED ORDER — IOPAMIDOL (ISOVUE-370) INJECTION 76%
INTRAVENOUS | Status: AC
  Filled 2017-01-30: qty 100

## 2017-01-30 MED ORDER — SODIUM CHLORIDE 0.9 % WEIGHT BASED INFUSION: 1.0000 mL/kg/h | INTRAVENOUS | Status: DC

## 2017-01-30 MED ORDER — IOPAMIDOL (ISOVUE-370) INJECTION 76%
INTRAVENOUS | Status: DC | PRN
  Administered 2017-01-30: 70 mL via INTRA_ARTERIAL

## 2017-01-30 MED ORDER — LIDOCAINE HCL (PF) 1 % IJ SOLN
INTRAMUSCULAR | Status: DC | PRN
  Administered 2017-01-30 (×2): 2 mL

## 2017-01-30 MED ORDER — PNEUMOCOCCAL VAC POLYVALENT 25 MCG/0.5ML IJ INJ
0.5000 mL | INJECTION | INTRAMUSCULAR | Status: AC
  Administered 2017-02-01: 0.5 mL via INTRAMUSCULAR
  Filled 2017-01-30: qty 0.5

## 2017-01-30 MED ORDER — ASPIRIN 81 MG PO CHEW: 81.0000 mg | CHEWABLE_TABLET | ORAL | Status: DC

## 2017-01-30 MED ORDER — VERAPAMIL HCL 2.5 MG/ML IV SOLN
INTRAVENOUS | Status: DC | PRN
  Administered 2017-01-30: 10 mL via INTRA_ARTERIAL

## 2017-01-30 MED ORDER — FENTANYL CITRATE (PF) 100 MCG/2ML IJ SOLN
INTRAMUSCULAR | Status: AC
  Filled 2017-01-30: qty 2

## 2017-01-30 MED ORDER — VERAPAMIL HCL 2.5 MG/ML IV SOLN
INTRAVENOUS | Status: AC
  Filled 2017-01-30: qty 2

## 2017-01-30 MED ORDER — HEPARIN SODIUM (PORCINE) 1000 UNIT/ML IJ SOLN
INTRAMUSCULAR | Status: AC
  Filled 2017-01-30: qty 1

## 2017-01-30 MED ORDER — FENTANYL CITRATE (PF) 100 MCG/2ML IJ SOLN
INTRAMUSCULAR | Status: DC | PRN
  Administered 2017-01-30: 50 ug via INTRAVENOUS

## 2017-01-30 MED ORDER — SODIUM CHLORIDE 0.9 % IV SOLN: Freq: Once | INTRAVENOUS | Status: DC

## 2017-01-30 MED ORDER — SODIUM CHLORIDE 0.9 % WEIGHT BASED INFUSION
3.0000 mL/kg/h | INTRAVENOUS | Status: AC
  Administered 2017-01-30: 3 mL/kg/h via INTRAVENOUS

## 2017-01-30 MED ORDER — HEPARIN SODIUM (PORCINE) 1000 UNIT/ML IJ SOLN
INTRAMUSCULAR | Status: DC | PRN
  Administered 2017-01-30: 5000 [IU] via INTRAVENOUS

## 2017-01-30 MED ORDER — HEPARIN (PORCINE) IN NACL 2-0.9 UNIT/ML-% IJ SOLN
INTRAMUSCULAR | Status: AC | PRN
  Administered 2017-01-30: 1000 mL

## 2017-01-30 MED ORDER — SODIUM CHLORIDE 0.9 % IV SOLN: 250.0000 mL | INTRAVENOUS | Status: DC | PRN

## 2017-01-30 MED ORDER — LIDOCAINE HCL 1 % IJ SOLN
INTRAMUSCULAR | Status: AC
  Filled 2017-01-30: qty 20

## 2017-01-30 MED ORDER — MIDAZOLAM HCL 2 MG/2ML IJ SOLN
INTRAMUSCULAR | Status: AC
  Filled 2017-01-30: qty 2

## 2017-01-30 MED ORDER — MIDAZOLAM HCL 2 MG/2ML IJ SOLN
INTRAMUSCULAR | Status: DC | PRN
Start: 2017-01-30 — End: 2017-01-30
  Administered 2017-01-30: 2 mg via INTRAVENOUS

## 2017-01-30 SURGICAL SUPPLY — 12 items
CATH BALLN WEDGE 5F 110CM (CATHETERS) ×2 IMPLANT
CATH INFINITI 5FR ANG PIGTAIL (CATHETERS) ×2 IMPLANT
CATH OPTITORQUE TIG 4.0 5F (CATHETERS) ×2 IMPLANT
DEVICE RAD COMP TR BAND LRG (VASCULAR PRODUCTS) ×2 IMPLANT
GLIDESHEATH SLEND SS 6F .021 (SHEATH) ×2 IMPLANT
GUIDEWIRE INQWIRE 1.5J.035X260 (WIRE) ×1 IMPLANT
INQWIRE 1.5J .035X260CM (WIRE) ×2
KIT HEART LEFT (KITS) ×2 IMPLANT
PACK CARDIAC CATHETERIZATION (CUSTOM PROCEDURE TRAY) ×2 IMPLANT
SHEATH GLIDE SLENDER 4/5FR (SHEATH) ×2 IMPLANT
TRANSDUCER W/STOPCOCK (MISCELLANEOUS) ×2 IMPLANT
TUBING CIL FLEX 10 FLL-RA (TUBING) ×2 IMPLANT

## 2017-01-30 NOTE — H&P (View-Only) (Signed)
01/24/2017 Mady Gemma   1945-09-12  [id number]  Primary Physician [name], MD Primary Cardiologist: Dr [name]   HPI:  71 y.o. male who was seen by Dr [name] for the evaluation of shortness of breath with exertion, exertional chest discomfort, and an abnrmal EKG. He was referred by [name], MD. 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 The patient gave a history of getting short of breath with walking. Previously he was able to walk without much difficulty, but now he gets short of breath simply walking back and forth to the mailbox. He was walking around at [place] the other week and he had to stop a sit down because of shortness of breath. He also complains of leg fatigue when walking worrisome for claudication. On exam he has evidence of PVD with carotid bruits bilaterally. His EKG was abnormal with inferior Qs. Work up revealed emphysema by CT as well a LUL nodule. His labs reveal a SCr of 1.8 and a random BS was 200. Myoview done 01/16/17 shows an EF of 45% with basal to mid inferior akinesis. He hasp a partially reversible medium-sized, moderate intensity basal to apical inferior defect that suggests infarction with peri-infarct ischemia. He is in the office today with his daughter to discuss heart catheterzation. He denies any rest symptoms.    Current Outpatient Prescriptions  Medication Sig Dispense Refill  . aspirin EC 81 MG tablet Take 81 mg by mouth daily.    Marland Kitchen atorvastatin (LIPITOR) 10 MG tablet Take 10 mg by mouth daily.    Marland Kitchen esomeprazole (NEXIUM) 40 MG capsule Take 40 mg by mouth daily as needed (for acid reflux).    . Lactobacillus Casei-Folic Acid (RESTORA RX) 60-1.25 MG CAPS Take 1 tablet by mouth as needed.     No current facility-administered medications for this visit.     Allergies  Allergen Reactions  . Ceclor [Cefaclor] Hives and Other (See Comments)    Dropped  blood pressure    Past Medical History:  Diagnosis Date  . Anemia 1964  . Arthritis    "touch starting to show up in the joints" (12/11/2013)  . COPD (chronic obstructive pulmonary disease) ([place])    "a little bit" (12/11/2013)  . Former heavy tobacco smoker   . GERD (gastroesophageal reflux disease)    Dr. Michail Sermon  . History of alcohol abuse     Social History   Social History  . Marital status: Divorced    Spouse name: N/A  . Number of children: N/A  . Years of education: N/A   Occupational History  . 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 Smoking status: Former Smoker    Packs/day: 2.00    Years: 52.00    Types: Cigarettes    Quit date: 09/21/2016  . Smokeless tobacco: Never Used  . Alcohol use 12.0 oz/week    20 Glasses of wine per week  . Drug use: Yes  . Sexual activity: No   Other Topics Concern  . 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     He lives by himself, having been divorced. He has only the one daughter and 2  grandchildren.   Does not routinely exercise.      He quit smoking along with alcohol - March 2018     Family History  Problem Relation Age of Onset  . Rheum arthritis Mother   . Cancer Father        BONE MARROW     Review of Systems: General: negative for chills, fever, night sweats or weight changes.  Cardiovascular: negative for chest pain, dyspnea on exertion, edema, orthopnea, palpitations, paroxysmal nocturnal dyspnea or shortness of breath Dermatological: negative for rash Respiratory: negative for cough or wheezing Urologic: negative for hematuria Abdominal: negative for nausea, vomiting, diarrhea, bright red blood per rectum, melena, or hematemesis Neurologic: negative for visual changes, syncope, or dizziness All other systems reviewed and are otherwise negative  except as noted above.    Blood pressure (!) 142/82, pulse 80, height 6\' 1"  (1.854 m), weight 246 lb 9.6 oz (111.9 kg).  General appearance: alert, cooperative, no distress and mildly obese Neck: no JVD and bilateral carotid bruit Lungs: clear to auscultation bilaterally Heart: regular rate and rhythm Abdomen: soft, non-tender; bowel sounds normal; no masses,  no organomegaly Extremities: extremities normal, atraumatic, no cyanosis or edema Pulses: PT pulses 1+ Skin: Skin color, texture, turgor normal. No rashes or lesions Neurologic: Grossly normal   ASSESSMENT AND PLAN:   Chest pain with high risk for cardiac etiology Pt has exertional angina with an abnormal Myoview  Abnormal nuclear stress test Myoview suggests inferior MI with ischemia  COPD GOLD II Followed by Dr Melvyn Novas  Bilateral carotid bruits Caroitid dopplers scheduled for 7/19  Claudication of both lower extremities ([place]) For dopplers 7/19  Renal insufficiency Chronicity unclear- SCr was 1.8 on 01/10/17  Elevated blood sugar Random BS was 200- will check HGb A1c   PLAN  Discussed with Dr [name]- plan admit for Rt and Lt cath next week. Will admit to short stay early in the am and hydrate, cath later in the day. The patient understands that risks included but are not limited to stroke (1 in 1000), death (1 in 84), kidney failure [usually temporary] (1 in 500), bleeding (1 in 200), allergic reaction [possibly serious] (1 in 200).  The patient understands and agrees to proceed.   [name] PA-C 01/24/2017 3:29 PM

## 2017-01-30 NOTE — Interval H&P Note (Signed)
Cath Lab Visit (complete for each Cath Lab visit)  Clinical Evaluation Leading to the Procedure:   ACS: No.  Non-ACS:    Anginal Classification: CCS III  Anti-ischemic medical therapy: No Therapy  Non-Invasive Test Results: Intermediate-risk stress test findings: cardiac mortality 1-3%/year  Prior CABG: No previous CABG      History and Physical Interval Note:  01/30/2017 2:47 PM  Paul Mcneil  has presented today for surgery, with the diagnosis of abnormal nuclear stress test  The various methods of treatment have been discussed with the patient and family. After consideration of risks, benefits and other options for treatment, the patient has consented to  Procedure(s): Right/Left Heart Cath and Coronary Angiography (N/A) as a surgical intervention .  The patient's history has been reviewed, patient examined, no change in status, stable for surgery.  I have reviewed the patient's chart and labs.  Questions were answered to the patient's satisfaction.     Shelva Majestic

## 2017-01-31 ENCOUNTER — Encounter (HOSPITAL_COMMUNITY): Payer: Self-pay | Admitting: Cardiovascular Disease

## 2017-01-31 ENCOUNTER — Inpatient Hospital Stay (HOSPITAL_COMMUNITY): Payer: Medicare Other

## 2017-01-31 ENCOUNTER — Other Ambulatory Visit: Payer: Self-pay | Admitting: *Deleted

## 2017-01-31 DIAGNOSIS — I6523 Occlusion and stenosis of bilateral carotid arteries: Secondary | ICD-10-CM

## 2017-01-31 DIAGNOSIS — Z01810 Encounter for preprocedural cardiovascular examination: Secondary | ICD-10-CM

## 2017-01-31 DIAGNOSIS — E1169 Type 2 diabetes mellitus with other specified complication: Secondary | ICD-10-CM | POA: Diagnosis present

## 2017-01-31 DIAGNOSIS — I2510 Atherosclerotic heart disease of native coronary artery without angina pectoris: Secondary | ICD-10-CM

## 2017-01-31 DIAGNOSIS — E785 Hyperlipidemia, unspecified: Secondary | ICD-10-CM | POA: Diagnosis present

## 2017-01-31 DIAGNOSIS — I200 Unstable angina: Secondary | ICD-10-CM

## 2017-01-31 LAB — HEPARIN LEVEL (UNFRACTIONATED)
HEPARIN UNFRACTIONATED: 0.26 [IU]/mL — AB (ref 0.30–0.70)
HEPARIN UNFRACTIONATED: 0.45 [IU]/mL (ref 0.30–0.70)

## 2017-01-31 LAB — CBC
HEMATOCRIT: 34 % — AB (ref 39.0–52.0)
HEMOGLOBIN: 11.4 g/dL — AB (ref 13.0–17.0)
MCH: 28.5 pg (ref 26.0–34.0)
MCHC: 33.5 g/dL (ref 30.0–36.0)
MCV: 85 fL (ref 78.0–100.0)
Platelets: 215 10*3/uL (ref 150–400)
RBC: 4 MIL/uL — ABNORMAL LOW (ref 4.22–5.81)
RDW: 12.7 % (ref 11.5–15.5)
WBC: 5.2 10*3/uL (ref 4.0–10.5)

## 2017-01-31 LAB — ECHOCARDIOGRAM COMPLETE
Area-P 1/2: 3.14 cm2
CHL CUP DOP CALC LVOT VTI: 24.8 cm
E decel time: 239 msec
E/e' ratio: 9.95
FS: 29 % (ref 28–44)
HEIGHTINCHES: 73 in
IVS/LV PW RATIO, ED: 0.94
LA ID, A-P, ES: 36 mm
LA diam end sys: 36 mm
LA diam index: 1.53 cm/m2
LA vol A4C: 44.6 ml
LA vol index: 18.5 mL/m2
LAVOL: 43.4 mL
LDCA: 3.14 cm2
LV PW d: 13.7 mm — AB (ref 0.6–1.1)
LV TDI E'LATERAL: 8.27
LVEEAVG: 9.95
LVEEMED: 9.95
LVELAT: 8.27 cm/s
LVOT diameter: 20 mm
LVOT peak grad rest: 5 mmHg
LVOTPV: 114 cm/s
LVOTSV: 78 mL
MV Dec: 239
MV pk A vel: 89.1 m/s
MV pk E vel: 82.3 m/s
MVPG: 3 mmHg
P 1/2 time: 70 ms
RV LATERAL S' VELOCITY: 13.5 cm/s
TAPSE: 25.7 mm
TDI e' medial: 4.79
WEIGHTICAEL: 3921.6 [oz_av]

## 2017-01-31 LAB — VAS US DOPPLER PRE CABG
LEFT ECA DIAS: 96 cm/s
LEFT VERTEBRAL DIAS: 8 cm/s
LICAPDIAS: -162 cm/s
Left CCA dist dias: -7 cm/s
Left CCA dist sys: -22 cm/s
Left CCA prox dias: 13 cm/s
Left CCA prox sys: 82 cm/s
Left ICA dist dias: -30 cm/s
Left ICA dist sys: -108 cm/s
Left ICA prox sys: -593 cm/s
RIGHT ECA DIAS: -20 cm/s
RIGHT VERTEBRAL DIAS: 19 cm/s
Right CCA prox dias: 14 cm/s
Right CCA prox sys: 103 cm/s
Right cca dist sys: -111 cm/s

## 2017-01-31 MED ORDER — ASPIRIN EC 81 MG PO TBEC: 81.0000 mg | DELAYED_RELEASE_TABLET | Freq: Every day | ORAL | Status: DC

## 2017-01-31 MED ORDER — HEPARIN BOLUS VIA INFUSION
4000.0000 [IU] | Freq: Once | INTRAVENOUS | Status: AC
  Administered 2017-01-31: 4000 [IU] via INTRAVENOUS
  Filled 2017-01-31: qty 4000

## 2017-01-31 MED ORDER — ASPIRIN EC 81 MG PO TBEC
81.0000 mg | DELAYED_RELEASE_TABLET | Freq: Every day | ORAL | Status: DC
  Administered 2017-01-31: 81 mg via ORAL
  Filled 2017-01-31: qty 1

## 2017-01-31 MED ORDER — ATORVASTATIN CALCIUM 40 MG PO TABS
40.0000 mg | ORAL_TABLET | Freq: Every day | ORAL | Status: DC
  Administered 2017-01-31: 40 mg via ORAL
  Filled 2017-01-31: qty 1

## 2017-01-31 MED ORDER — ATORVASTATIN CALCIUM 10 MG PO TABS: 10.0000 mg | ORAL_TABLET | Freq: Every day | ORAL | Status: DC

## 2017-01-31 MED ORDER — ISOSORBIDE MONONITRATE ER 30 MG PO TB24
30.0000 mg | ORAL_TABLET | Freq: Every day | ORAL | Status: DC
  Administered 2017-01-31 – 2017-02-01 (×2): 30 mg via ORAL
  Filled 2017-01-31 (×2): qty 1

## 2017-01-31 MED ORDER — CARVEDILOL 3.125 MG PO TABS: 3.1250 mg | ORAL_TABLET | Freq: Two times a day (BID) | ORAL | Status: DC

## 2017-01-31 MED ORDER — HEPARIN (PORCINE) IN NACL 100-0.45 UNIT/ML-% IJ SOLN
1550.0000 [IU]/h | INTRAMUSCULAR | Status: DC
  Administered 2017-01-31: 1350 [IU]/h via INTRAVENOUS
  Administered 2017-01-31: 1550 [IU]/h via INTRAVENOUS
  Filled 2017-01-31 (×2): qty 250

## 2017-01-31 MED ORDER — ONDANSETRON HCL 4 MG/2ML IJ SOLN: 4.0000 mg | Freq: Four times a day (QID) | INTRAMUSCULAR | Status: DC | PRN

## 2017-01-31 MED ORDER — PANTOPRAZOLE SODIUM 40 MG PO TBEC
40.0000 mg | DELAYED_RELEASE_TABLET | Freq: Every day | ORAL | Status: DC
  Administered 2017-01-31 – 2017-02-01 (×2): 40 mg via ORAL
  Filled 2017-01-31 (×2): qty 1

## 2017-01-31 MED ORDER — BISOPROLOL FUMARATE 5 MG PO TABS
5.0000 mg | ORAL_TABLET | Freq: Every day | ORAL | Status: DC
  Administered 2017-01-31 – 2017-02-01 (×2): 5 mg via ORAL
  Filled 2017-01-31 (×2): qty 1

## 2017-01-31 NOTE — Progress Notes (Signed)
Progress Note  Patient Name: Paul Mcneil Date of Encounter: 01/31/2017  Primary Cardiologist: Glenetta Hew, MD   Subjective   Feels relatively well as long as he doesn't "overexerts himself. He was able to walk up and down the hallway without angina, was actually more limited by claudication. He is concerned about the results of his stress test and is eagerly awaiting CT surgical evaluation.  Inpatient Medications    Scheduled Meds: . aspirin EC  81 mg Oral QHS  . atorvastatin  10 mg Oral QHS  . bisoprolol  5 mg Oral Daily  . pantoprazole  40 mg Oral Daily  . pneumococcal 23 valent vaccine  0.5 mL Intramuscular Tomorrow-1000   Continuous Infusions: . heparin 1,550 Units/hr (01/31/17 1245)   PRN Meds: ondansetron (ZOFRAN) IV   Vital Signs    Vitals:   01/30/17 2009 01/31/17 0102 01/31/17 0500 01/31/17 1223  BP: (!) 127/101 137/68 138/75 (!) 146/76  Pulse: 74 73 71 75  Resp: 18 18 17 18   Temp: 98 F (36.7 C) 99 F (37.2 C) 97.8 F (36.6 C) 98.9 F (37.2 C)  TempSrc: Oral Oral Oral Oral  SpO2: 99% 95% 98% 97%  Weight:   245 lb 1.6 oz (111.2 kg)   Height:        Intake/Output Summary (Last 24 hours) at 01/31/17 1420 Last data filed at 01/31/17 1413  Gross per 24 hour  Intake             1840 ml  Output             1400 ml  Net              440 ml   Filed Weights   01/30/17 0655 01/30/17 1752 01/31/17 0500  Weight: 240 lb (108.9 kg) 243 lb 9.6 oz (110.5 kg) 245 lb 1.6 oz (111.2 kg)    Telemetry    Normal sinus rhythm, occasional ectopy - Personally Reviewed  ECG    No new echo - Personally Reviewed  Physical Exam   GEN: No acute distress.  Relatively healthy-appearing. Neck: No JVD, bilateral (L> R) carotid bruit Cardiac: RRR, normal S1, S2. no M/R/G. Nondisplaced PMI Respiratory:  mild diffuse crackles bilaterally but no rales or rhonchi. Late expiratory wheeze scattered GI: Soft, nontender, non-distended  MS: No edema; No  deformity. Neuro:  Nonfocal  Psych: Normal affect   Labs    Chemistry Recent Labs Lab 01/24/17 1535  NA 137  K 4.7  CL 100  CO2 21  GLUCOSE 322*  BUN 22  CREATININE 1.91*  CALCIUM 9.5  GFRNONAA 35*  GFRAA 40*     Hematology Recent Labs Lab 01/24/17 1535 01/31/17 0441  WBC 6.0 5.2  RBC 4.41 4.00*  HGB 12.9* 11.4*  HCT 39.2 34.0*  MCV 89 85.0  MCH 29.3 28.5  MCHC 32.9 33.5  RDW 13.5 12.7  PLT 242 215    Cardiac EnzymesNo results for input(s): TROPONINI in the last 168 hours. No results for input(s): TROPIPOC in the last 168 hours.   BNPNo results for input(s): BNP, PROBNP in the last 168 hours.   DDimer No results for input(s): DDIMER in the last 168 hours.   Radiology    No results found.  Cardiac Studies    Cardiac cath 01/30/2017: oRCA 80%, mRCA 90%-100%. dLM ~50-60%, oRI 70% pRI 80%, pCx 90% mCx 80%, OM3 70%, p-mLAD 40%.  LVEDP 19 mmHg  Normal right heart pressures.  Significant multivessel CAD with  50-60% smooth distal left main stenosis with the percent proximal mid LAD stenoses; 70% ostial and 80% proximal ramus intermediate stenoses; 90% eccentric proximal circumflex stenoses with 80 and 70% distal stenoses and 80% proximal, 90% proximal to mid and total occlusion of the mid RCA with left-to-right collaterals.  LVEDP 19 mmHg.  Left ventriculography was not done due to the patient's renal insufficiency.  RECOMMENDATION: The patient will be hydrated overnight.  He will be started on IV heparin.  Surgical consultation will be obtained for CABG revascularization surgery.  Angiographic findings were reviewed with Dr. Glenetta Hew.     2D Echo: EF 60-65%. Mild LVH. GR 1 DD. Mild MR.   Patient Profile     71 y.o. male former smoker with hypertension and hyperlipidemia and likely COPD he was evaluated with a stress test for symptoms concerning for progressive angina. The stress test was abnormal and he is referred for cardiac  catheterization. Catheterization showed multivessel disease noted above. Otherwise admitted on IV heparin infusion for CT surgical consultation.  Assessment & Plan    Principal Problem:   Progressive angina (HCC) Active Problems:   CAD, multiple vessel   Abnormal nuclear stress test   COPD GOLD II   Dyslipidemia, goal LDL below 70   Mr. Kusch has multivessel disease as noted on his cardiac cath. The most significant lesions besides the total occluded RCA lesions in the circumflex. He is not actively having anginal pain while on IV heparin.     Plan:  Continue IV heparin pending surgical evaluation --> I do think, that he may be up to get his medications titrated such that he can go home if necessary to return next week for CABG if the schedule suggest that this is the best option. He indicated he would like to go home, and would not do much of anything. (Preference would be to stay, however I will defer to surgical evaluation and scheduled)  Adding low-dose beta blocker (bisoprolol for Beta1 selectivity),  On statin, will increase dose to 40 mg  Continue ASA  Add Imdur  Dispo: We are awaiting CT surgical consultation.  Will need to determine if he is potentially stable for discharge home with CABG next week versus does he need to stay in the hospital.  Signed, Glenetta Hew, MD  01/31/2017, 2:20 PM

## 2017-01-31 NOTE — Progress Notes (Signed)
ANTICOAGULATION CONSULT NOTE - Initial Consult  Pharmacy Consult for Heparin Indication: chest pain/ACS  Allergies  Allergen Reactions  . Ceclor [Cefaclor] Hives and Other (See Comments)    Dropped blood pressure    Patient Measurements: Height: 6\' 1"  (185.4 cm) Weight: 243 lb 9.6 oz (110.5 kg) IBW/kg (Calculated) : 79.9 Heparin Dosing Weight: 103 kg  Assessment: 71 yo M presents on 7/11 for planned cath. Pharmacy to start heparin. Last CBC ok on 7/5. No anticoag PTA.  Goal of Therapy:  Heparin level 0.3-0.7 units/ml Monitor platelets by anticoagulation protocol: Yes   Plan:  Give heparin 4,000 unit heparin bolus Start heparin gtt at 1,350 units/hr Monitor daily heparin level, CBC, s/s of bleed   Elenor Quinones, PharmD, [facility] Clinical Pharmacist Pager [phone] 01/31/2017 2:56 AM

## 2017-01-31 NOTE — Progress Notes (Addendum)
Pre-op Cardiac Surgery  Carotid Findings:  Findings suggest upper range 60-79% right internal carotid artery stenosis and 80-99% left internal carotid artery stenosis. The left external carotid artery exhibits elevated velocities suggestive of >50% stenosis. Vertebral arteries are patent with antegrade flow.    Upper Extremity Right Left  Brachial Pressures 172-Triphasic 159-Triphasic  Radial Waveforms Triphasic Triphasic  Ulnar Waveforms Triphasic Triphasic  Palmar Arch (Allen's Test) Signal decreases 50% with radial compression, is unaffected with ulnar compression. Signal obliterates with radial compression, is unaffected with ulnar compression.    Lower  Extremity Right Left  Dorsalis Pedis 159-Triphasic 138-Triphasic  Anterior Tibial    Posterior Tibial 143-Triphasic 143-Triphasic  Ankle/Brachial Indices 0.92 0.83    Findings:   Bilateral ABIs are suggestive of mild arterial insufficiency at rest.   Preliminary results discussed with Ryan of [facility].  01/31/2017 3:54 PM Maudry Mayhew, BS, RVT, RDCS, RDMS

## 2017-01-31 NOTE — Progress Notes (Signed)
Inpatient Diabetes Program Recommendations  AACE/ADA: New Consensus Statement on Inpatient Glycemic Control (2015)  Target Ranges:  Prepandial:   less than 140 mg/dL      Peak postprandial:   less than 180 mg/dL (1-2 hours)      Critically ill patients:  140 - 180 mg/dL   Lab Results  Component Value Date   GLUCAP 242 (H) 01/30/2017   HGBA1C 8.7 (H) 01/24/2017    Review of Glycemic Control  Diabetes history: No prior DM hx noted  Inpatient Diabetes Program Recommendations:  Noted A1c 8.7. Is still a new DM diagnosis? Will follow.  Thank you, Nani Gasser. Mckensi Redinger, RN, MSN, CDE  Diabetes Coordinator Inpatient Glycemic Control Team Team Pager [phone] (8am-5pm) 01/31/2017 10:25 AM

## 2017-01-31 NOTE — Consult Note (Signed)
Reason for Consult: Left main/ 3 vessel CAD Referring Physician: Dr. Stacey Drain  Paul Mcneil is an 71 y.o. male.  HPI: 71 yo man with a past history of anemia, arthritis, GERD, COPD (GOLD II), 100 pack year history of smoking (quit 4 months ago), and a history of ethanol use. He is a retired Engineer, structural. He has no prior history of CAD. He has noticed increasing shortness of breath with exertion over the past few weeks. He says that when he walks his legs feel like lead in the hips and he feels SOB. Symptoms resolve with rest. No rest or nocturnal symptoms.  He saw Dr. Dorthy Cooler who did a w/u including an EKG which showed inferior Q waves. A CT which showed a LUL nodule. A stress myoview showed a partially reversible basal to apical inferior defect suggestive of infarct with peri-infarct ischemia. Today he had cardiac catheterization which revealed moderate left main disease with severe disease in the RCA and the circumflex.  He is currently pain free.  Past Medical History:  Diagnosis Date  . Anemia 1964  . Arthritis    "touch starting to show up in the joints" (12/11/2013)  . COPD (chronic obstructive pulmonary disease) ([place])    "a little bit" (12/11/2013)  . Former heavy tobacco smoker   . GERD (gastroesophageal reflux disease)    Dr. Michail Sermon  . History of alcohol abuse     Past Surgical History:  Procedure Laterality Date  . APPENDECTOMY  12/11/2013   "laparoscopic"  . CHOLECYSTECTOMY  ~ 2010  . LAPAROSCOPIC APPENDECTOMY N/A 12/11/2013   Procedure: APPENDECTOMY LAPAROSCOPIC;  Surgeon: Imogene Burn. Georgette Dover, MD;  Location: [facility];  Service: General;  Laterality: N/A;  . RIGHT/LEFT HEART CATH AND CORONARY ANGIOGRAPHY N/A 01/30/2017   Procedure: Right/Left Heart Cath and Coronary Angiography;  Surgeon: Troy Sine, MD;  Location: [facility];  Service: Cardiovascular;  Laterality: N/A;  . TONSILLECTOMY AND ADENOIDECTOMY  1950's    Family History  Problem Relation Age of  Onset  . Rheum arthritis Mother   . Cancer Father        BONE MARROW    Social History:  reports that he quit smoking about 4 months ago. His smoking use included Cigarettes. He has a 104.00 pack-year smoking history. He has never used smokeless tobacco. He reports that he drinks about 12.0 oz of alcohol per week . He reports that he uses drugs.  Allergies:  Allergies  Allergen Reactions  . Ceclor [Cefaclor] Hives and Other (See Comments)    Dropped blood pressure    Medications:  Prior to Admission:  Prescriptions Prior to Admission  Medication Sig Dispense Refill Last Dose  . aspirin EC 81 MG tablet Take 81 mg by mouth at bedtime.    01/30/2017 at 0630  . atorvastatin (LIPITOR) 10 MG tablet Take 10 mg by mouth at bedtime.    01/29/2017 at Unknown time  . esomeprazole (NEXIUM) 40 MG capsule Take 40 mg by mouth daily as needed (for acid reflux).   Past Week at Unknown time  . Garlic (GARLIQUE) 425 MG TBEC Take 400 mg by mouth every other day.   01/29/2017 at Unknown time  . Lactobacillus Casei-Folic Acid (RESTORA RX) 60-1.25 MG CAPS Take 1 tablet by mouth as needed.   Past Week at Unknown time  . Omega-3 Fatty Acids (OMEGA-3 FISH OIL PO) Take 360 mg by mouth every other day.   Past Week at Unknown time    Results  for orders placed or performed during the hospital encounter of 01/30/17 (from the past 48 hour(s))  Glucose, capillary     Status: Abnormal   Collection Time: 01/30/17  7:48 AM  Result Value Ref Range   Glucose-Capillary 266 (H) 65 - 99 mg/dL  I-STAT 3, venous blood gas (G3P V)     Status: Abnormal   Collection Time: 01/30/17  3:27 PM  Result Value Ref Range   pH, Ven 7.395 7.250 - 7.430   pCO2, Ven 42.9 (L) 44.0 - 60.0 mmHg   pO2, Ven 35.0 32.0 - 45.0 mmHg   Bicarbonate 26.3 20.0 - 28.0 mmol/L   TCO2 28 0 - 100 mmol/L   O2 Saturation 68.0 %   Acid-Base Excess 1.0 0.0 - 2.0 mmol/L   Patient temperature HIDE    Sample type VENOUS    Comment NOTIFIED PHYSICIAN    I-STAT 3, arterial blood gas (G3+)     Status: None   Collection Time: 01/30/17  3:32 PM  Result Value Ref Range   pH, Arterial 7.396 7.350 - 7.450   pCO2 arterial 37.0 32.0 - 48.0 mmHg   pO2, Arterial 105.0 83.0 - 108.0 mmHg   Bicarbonate 22.7 20.0 - 28.0 mmol/L   TCO2 24 0 - 100 mmol/L   O2 Saturation 98.0 %   Acid-base deficit 2.0 0.0 - 2.0 mmol/L   Patient temperature HIDE    Sample type ARTERIAL   Glucose, capillary     Status: Abnormal   Collection Time: 01/30/17  4:49 PM  Result Value Ref Range   Glucose-Capillary 218 (H) 65 - 99 mg/dL  Glucose, capillary     Status: Abnormal   Collection Time: 01/30/17  8:27 PM  Result Value Ref Range   Glucose-Capillary 242 (H) 65 - 99 mg/dL  CBC     Status: Abnormal   Collection Time: 01/31/17  4:41 AM  Result Value Ref Range   WBC 5.2 4.0 - 10.5 K/uL   RBC 4.00 (L) 4.22 - 5.81 MIL/uL   Hemoglobin 11.4 (L) 13.0 - 17.0 g/dL   HCT 34.0 (L) 39.0 - 52.0 %   MCV 85.0 78.0 - 100.0 fL   MCH 28.5 26.0 - 34.0 pg   MCHC 33.5 30.0 - 36.0 g/dL   RDW 12.7 11.5 - 15.5 %   Platelets 215 150 - 400 K/uL  Heparin level (unfractionated)     Status: Abnormal   Collection Time: 01/31/17 10:59 AM  Result Value Ref Range   Heparin Unfractionated 0.26 (L) 0.30 - 0.70 IU/mL    Comment:        IF HEPARIN RESULTS ARE BELOW EXPECTED VALUES, AND PATIENT DOSAGE HAS BEEN CONFIRMED, SUGGEST FOLLOW UP TESTING OF ANTITHROMBIN III LEVELS.     No results found.  Review of Systems  Constitutional: Positive for malaise/fatigue. Negative for chills and fever.  Eyes: Negative for blurred vision and double vision.  Respiratory: Positive for cough and shortness of breath.   Cardiovascular: Positive for chest pain and claudication (buttocks and hips).  Gastrointestinal: Positive for heartburn. Negative for nausea and vomiting.  Genitourinary: Negative for dysuria and urgency.  Neurological: Negative for focal weakness and loss of consciousness.  All other  systems reviewed and are negative.  Blood pressure (!) 152/76, pulse 74, temperature 98 F (36.7 C), temperature source Oral, resp. rate 18, height 6\' 1"  (1.854 m), weight 245 lb 1.6 oz (111.2 kg), SpO2 98 %. Physical Exam  Vitals reviewed. Constitutional: He is oriented to person, place,  and time. He appears well-developed and well-nourished. No distress.  HENT:  Head: Normocephalic and atraumatic.  Mouth/Throat: No oropharyngeal exudate.  Eyes: Conjunctivae and EOM are normal. No scleral icterus.  Neck: Neck supple. No thyromegaly present.  Bilateral carotid bruits R>L  Cardiovascular: Normal rate and regular rhythm.  Exam reveals no gallop and no friction rub.   No murmur heard. Respiratory: Effort normal and breath sounds normal. No respiratory distress. He has no wheezes. He has no rales.  GI: Soft. He exhibits no distension. There is no tenderness.  Musculoskeletal: He exhibits no edema or deformity.  Lymphadenopathy:    He has no cervical adenopathy.  Neurological: He is alert and oriented to person, place, and time. No cranial nerve deficit. He exhibits normal muscle tone.  Skin: Skin is warm and dry.   ECHOCARDIOGRAM Study Conclusions  - Left ventricle: Mild aneurysmal dilitation of inferior base. The   cavity size was normal. Wall thickness was increased in a pattern   of mild LVH. Systolic function was normal. The estimated ejection   fraction was in the range of 60% to 65%. Doppler parameters are   consistent with abnormal left ventricular relaxation (grade 1   diastolic dysfunction). - Mitral valve: There was mild regurgitation.  CARDIAC CATHETERIZATION Conclusion     Ost RCA lesion, 80 %stenosed.  Mid RCA-2 lesion, 100 %stenosed.  Mid RCA-1 lesion, 90 %stenosed.  Ost LM lesion, 60 %stenosed.  Ost Ramus lesion, 70 %stenosed.  Ramus lesion, 80 %stenosed.  Prox Cx lesion, 90 %stenosed.  Mid Cx lesion, 80 %stenosed.  3rd Mrg lesion, 70  %stenosed.  Prox LAD to Mid LAD lesion, 40 %stenosed.  Mid LAD lesion, 40 %stenosed.   Normal right heart pressures.  Significant multivessel CAD with  50-60% smooth distal left main stenosis with the percent proximal mid LAD stenoses; 70% ostial and 80% proximal ramus intermediate stenoses; 90% eccentric proximal circumflex stenoses with 80 and 70% distal stenoses and 80% proximal, 90% proximal to mid and total occlusion of the mid RCA with left-to-right collaterals.  LVEDP 19 mmHg.  Left ventriculography was not done due to the patient's renal insufficiency.  RECOMMENDATION: The patient will be hydrated overnight.  He will be started on IV heparin.  Surgical consultation will be obtained for CABG revascularization surgery.  Angiographic findings were reviewed with Dr. Glenetta Hew.    I personally reviewed the cath images and concur with the findings noted above  Assessment/Plan: Paul Mcneil is a 71 year old man with multiple CRf including heavy tobacco abuse, hypertension, and hyperlipidemia. He also has recently been found to have hyperglycemia and is likely an undiagnosed type II diabetic. He presents with SOB with exertion and was found to have left main and 3 vessel CAD. EF 60% by echo. CABG is indicated for survival benefit and relief of symptoms.   In addition to newly diagnosed diabetes he also has been found to have renal insufficiency with a creatinine of 1.8. It is unknown if this is acute or chronic.  I discussed the general nature of CABG, the need for general anesthesia, the use of cardiopulmonary bypass, and the incisions to be used with Paul Mcneil. We discussed the expected hospital stay, overall recovery and short and long term outcomes. I informed him of the indications, risks, benefits and alternatives. He understands the risks include, but are not limited to death, stroke, MI, DVT/PE, bleeding, possible need for transfusion, infections, cardiac arrhythmias, as well as  other organ system dysfunction including  respiratory, renal, or GI complications. He understands PAD is a marker for increased surgical risk. Also increased risk with renal issues, new diabetes and smoking history.  He accepts the risks and wishes to proceed. I would be unable to do his procedure until Friday 7/27. One of my partners may be able to do one day next week. Our scheduling nurse will set up a date. I think he would probably be safe to be discharged after he is seen by Vascular and come back for surgery next week.  Left main and 3 vessel CAD- CABG indicated for survival benefit and relief of symptoms.  Carotid stenosis- bilateral. Left is 80-99% and right is 60-79%- Vascular surgery consult.  PAD with hip claudication symptoms- ABI Left 0.83, right 0.92- Vascular consult  Lung nodule left upper lobe- appearance suggestive of infectious/ inflammatory nodule or scar, but given smoking history needs close follow up.  Melrose Nakayama 01/31/2017, 5:51 PM

## 2017-01-31 NOTE — Progress Notes (Signed)
ANTICOAGULATION CONSULT NOTE - Follow Up Consult  Pharmacy Consult for Heparin Indication: chest pain/ACS, multi-vessel CAD   Allergies  Allergen Reactions  . Ceclor [Cefaclor] Hives and Other (See Comments)    Dropped blood pressure    Patient Measurements: Height: 6\' 1"  (185.4 cm) Weight: 245 lb 1.6 oz (111.2 kg) (Scale B) IBW/kg (Calculated) : 79.9 Heparin Dosing Weight: 100 kg  Vital Signs: Temp: 98 F (36.7 C) (07/12 1709) Temp Source: Oral (07/12 1709) BP: 152/76 (07/12 1709) Pulse Rate: 74 (07/12 1709)  Labs:  Recent Labs  01/31/17 0441 01/31/17 1059 01/31/17 1835  HGB 11.4*  --   --   HCT 34.0*  --   --   PLT 215  --   --   HEPARINUNFRC  --  0.26* 0.45    Estimated Creatinine Clearance: 47 mL/min (A) (by C-G formula based on SCr of 1.91 mg/dL (H)).  Assessment:  71 yr old male s/p cardiac cath 7/11 revealing multivessel CAD.  For surgical consult for CABG.     Follow up heparin level is therapeutic (0.45) on 1550 units/hr.      Renal insufficiency. Last bmet 7/5 - Scr 1.91.  70 ml dye with cath 7/11. Repeat bmet 7/13  Goal of Therapy:  Heparin level 0.3-0.7 units/ml Monitor platelets by anticoagulation protocol: Yes   Plan:   Continue heparin drip at 1550 units/hr.  Daily heparin level and CBC while on heparin.  Follow up plans.  Recheck BMET in am  Erin Hearing PharmD., [facility] Clinical Pharmacist Pager [phone] 01/31/2017 7:40 PM

## 2017-01-31 NOTE — Progress Notes (Signed)
Pt had an overall restless night, puncture site are level '0" with no swelling, Vitals stable, hep Drip started with 4,000 bolus with a continues Rate of 1350 unit/hour to left F/A. Plan Cabg at a later date.

## 2017-01-31 NOTE — Progress Notes (Signed)
Paged cardiology regarding pt plan of care. Pt anxious and stated "no one has explained anything to me" "I just want answers" Awaiting call back

## 2017-01-31 NOTE — Progress Notes (Signed)
  Echocardiogram 2D Echocardiogram has been performed.  Paul Mcneil 01/31/2017, 10:41 AM

## 2017-01-31 NOTE — Progress Notes (Signed)
ANTICOAGULATION CONSULT NOTE - Follow Up Consult  Pharmacy Consult for Heparin Indication: chest pain/ACS, multi-vessel CAD   Allergies  Allergen Reactions  . Ceclor [Cefaclor] Hives and Other (See Comments)    Dropped blood pressure    Patient Measurements: Height: 6\' 1"  (185.4 cm) Weight: 245 lb 1.6 oz (111.2 kg) (Scale B) IBW/kg (Calculated) : 79.9 Heparin Dosing Weight: 100 kg  Vital Signs: Temp: 97.8 F (36.6 C) (07/12 0500) Temp Source: Oral (07/12 0500) BP: 138/75 (07/12 0500) Pulse Rate: 71 (07/12 0500)  Labs:  Recent Labs  01/31/17 0441 01/31/17 1059  HGB 11.4*  --   HCT 34.0*  --   PLT 215  --   HEPARINUNFRC  --  0.26*    Estimated Creatinine Clearance: 47 mL/min (A) (by C-G formula based on SCr of 1.91 mg/dL (H)).  Assessment:  71 yr old male s/p cardiac cath 7/11 revealing multivessel CAD.  For surgical consult for CABG.     Initial heparin level is subtherapeutic (0.26) on 1350 units/hr.  No CBC today.    Renal insufficiency. Last bmet 7/5 - Scr 1.91.  70 ml dye with cath 7/11.  Goal of Therapy:  Heparin level 0.3-0.7 units/ml Monitor platelets by anticoagulation protocol: Yes   Plan:   Increase heparin drip to 1550 units/hr.  Heparin level ~6 hrs after rate change.  Daily heparin level and CBC while on heparin.  Follow up plans.  Recheck bmet?  Arty Baumgartner, [place] Pager: [phone] 01/31/2017,12:20 PM

## 2017-01-31 NOTE — Progress Notes (Signed)
PA called back and stated MD [name] will be by to see pt later today. Pt aware  Paul Mcneil

## 2017-02-01 ENCOUNTER — Encounter (HOSPITAL_COMMUNITY): Payer: Self-pay | Admitting: Cardiology

## 2017-02-01 ENCOUNTER — Other Ambulatory Visit: Payer: Self-pay | Admitting: *Deleted

## 2017-02-01 DIAGNOSIS — I779 Disorder of arteries and arterioles, unspecified: Secondary | ICD-10-CM

## 2017-02-01 DIAGNOSIS — E119 Type 2 diabetes mellitus without complications: Secondary | ICD-10-CM

## 2017-02-01 DIAGNOSIS — I739 Peripheral vascular disease, unspecified: Secondary | ICD-10-CM

## 2017-02-01 DIAGNOSIS — I25118 Atherosclerotic heart disease of native coronary artery with other forms of angina pectoris: Secondary | ICD-10-CM

## 2017-02-01 DIAGNOSIS — I6523 Occlusion and stenosis of bilateral carotid arteries: Secondary | ICD-10-CM

## 2017-02-01 LAB — BASIC METABOLIC PANEL
Anion gap: 10 (ref 5–15)
BUN: 15 mg/dL (ref 6–20)
CHLORIDE: 106 mmol/L (ref 101–111)
CO2: 21 mmol/L — AB (ref 22–32)
CREATININE: 2.09 mg/dL — AB (ref 0.61–1.24)
Calcium: 8.8 mg/dL — ABNORMAL LOW (ref 8.9–10.3)
GFR calc Af Amer: 35 mL/min — ABNORMAL LOW (ref >60)
GFR calc non Af Amer: 30 mL/min — ABNORMAL LOW (ref >60)
Glucose, Bld: 201 mg/dL — ABNORMAL HIGH (ref 65–99)
POTASSIUM: 4.2 mmol/L (ref 3.5–5.1)
Sodium: 137 mmol/L (ref 135–145)

## 2017-02-01 LAB — CBC
HEMATOCRIT: 32.8 % — AB (ref 39.0–52.0)
Hemoglobin: 11 g/dL — ABNORMAL LOW (ref 13.0–17.0)
MCH: 28.4 pg (ref 26.0–34.0)
MCHC: 33.5 g/dL (ref 30.0–36.0)
MCV: 84.5 fL (ref 78.0–100.0)
Platelets: 212 10*3/uL (ref 150–400)
RBC: 3.88 MIL/uL — ABNORMAL LOW (ref 4.22–5.81)
RDW: 13 % (ref 11.5–15.5)
WBC: 6 10*3/uL (ref 4.0–10.5)

## 2017-02-01 LAB — HEPARIN LEVEL (UNFRACTIONATED): Heparin Unfractionated: 0.59 IU/mL (ref 0.30–0.70)

## 2017-02-01 MED ORDER — BISOPROLOL FUMARATE 5 MG PO TABS: 5.0000 mg | ORAL_TABLET | Freq: Every day | ORAL | 3 refills | Status: DC

## 2017-02-01 MED ORDER — GLIPIZIDE 5 MG PO TABS: 2.5000 mg | ORAL_TABLET | Freq: Every day | ORAL | 2 refills | Status: DC

## 2017-02-01 MED ORDER — ISOSORBIDE MONONITRATE ER 30 MG PO TB24: 30.0000 mg | ORAL_TABLET | Freq: Every day | ORAL | 0 refills | Status: DC

## 2017-02-01 MED ORDER — LIVING WELL WITH DIABETES BOOK
Freq: Once | Status: AC
  Administered 2017-02-01: 16:00:00
  Filled 2017-02-01: qty 1

## 2017-02-01 MED ORDER — ATORVASTATIN CALCIUM 40 MG PO TABS: 40.0000 mg | ORAL_TABLET | Freq: Every day | ORAL | 0 refills | Status: DC

## 2017-02-01 MED ORDER — BLOOD GLUCOSE METER KIT: PACK | 0 refills | Status: DC

## 2017-02-01 NOTE — Progress Notes (Signed)
Pt ambulated without complications, pt did refuse to wear the telemetry during the walk. No other complaints, other than desire to leave.

## 2017-02-01 NOTE — Progress Notes (Signed)
Inpatient Diabetes Program Recommendations  AACE/ADA: New Consensus Statement on Inpatient Glycemic Control (2015)  Target Ranges:  Prepandial:   less than 140 mg/dL      Peak postprandial:   less than 180 mg/dL (1-2 hours)      Critically ill patients:  140 - 180 mg/dL   Spoke with patient about A1c level 8.7%. Explained about the body's impairment in  processing glucose. Spoke to patient about glucose and A1c goals prior to surgery.  Discussed impact of nutrition, exercise, stress, sickness, and medications on diabetes control. Discussed in detail carbohydrates, carbohydrate goals per day and meal, along with portion sizes. Discussed sweetners. Patient reports daughter has issues with high glucose levels as well but is not diagnosed with Diabetes. Encouraged patient to check glucose 4 times per day. Spoke to patient about f/u with PCP. Discussed importance of maintaining good CBG control to prevent long-term and short-term complications.  Thanks,  Tama Headings RN, MSN, [facility] Inpatient Diabetes Coordinator Team Pager [phone] (8a-5p)

## 2017-02-01 NOTE — Progress Notes (Addendum)
Inpatient Diabetes Program Recommendations  AACE/ADA: New Consensus Statement on Inpatient Glycemic Control (2015)  Target Ranges:  Prepandial:   less than 140 mg/dL      Peak postprandial:   less than 180 mg/dL (1-2 hours)      Critically ill patients:  140 - 180 mg/dL   Review of Glycemic Control  Diabetes history: Hyperglycemia Outpatient Diabetes medications: None Current orders for Inpatient glycemic control: None  Inpatient Diabetes Program Recommendations:    Admitting glucose 266 mg/dl. A1c 8.7% obtained 01/24/17. For outpatient glucose control prior to surgery consider Glipizide 2.5 mg Daily and ordering a glucose meter kit (order # [id number])  Paged Theda Sers, [place] waiting call back. Cardiology Primary team paged Mancel Bale, NP awaiting call back.   Thanks,  Tama Headings RN, MSN, [facility] Inpatient Diabetes Coordinator Team Pager [phone] (8a-5p)

## 2017-02-01 NOTE — Consult Note (Signed)
[name] NOTE   MRN : [id number]  Reason for Consult: Carotid stenosis Referring Physician: Dr. Roxan Hockey  History of Present Illness: 71 y/o male He was admitted for cardiac work up due to increasing shortness of breath with exertion over the past few weeks.  He saw Dr. Dorthy Cooler who did a w/u including an EKG which showed inferior Q waves. A CT which showed a LUL nodule. A stress myoview showed a partially reversible basal to apical inferior defect suggestive of infarct with peri-infarct ischemia. Today he had cardiac catheterization which revealed moderate left main disease with severe disease in the RCA and the circumflex.  Pre operative planning by Dr. Roxan Hockey discovered B carotid stenosis.  Right 60-79% and left 80-99%.     Past history of anemia, arthritis, GERD, COPD (GOLD II), 100 pack year history of smoking (quit 4 months ago), and a history of ethanol use. He is a retired Engineer, structural. He has no prior history of CAD.   Current Facility-Administered Medications  Medication Dose Route Frequency Provider Last Rate Last Dose  . aspirin EC tablet 81 mg  81 mg Oral QHS Minus Breeding, MD   81 mg at 01/31/17 2101  . atorvastatin (LIPITOR) tablet 40 mg  40 mg Oral QHS Leonie Man, MD   40 mg at 01/31/17 2101  . bisoprolol (ZEBETA) tablet 5 mg  5 mg Oral Daily Leonie Man, MD   5 mg at 01/31/17 1614  . heparin ADULT infusion 100 units/mL (25000 units/250mL sodium chloride 0.45%)  1,550 Units/hr Intravenous Continuous Skeet Simmer, RPH 15.5 mL/hr at 02/01/17 0754 1,550 Units/hr at 02/01/17 0754  . isosorbide mononitrate (IMDUR) 24 hr tablet 30 mg  30 mg Oral Daily Leonie Man, MD   30 mg at 01/31/17 1613  . ondansetron (ZOFRAN) injection 4 mg  4 mg Intravenous Q6H PRN Minus Breeding, MD      . pantoprazole (PROTONIX) EC tablet 40 mg  40 mg Oral Daily Minus Breeding, MD   40 mg at 01/31/17 0931  . pneumococcal 23 valent  vaccine (PNU-IMMUNE) injection 0.5 mL  0.5 mL Intramuscular Tomorrow-1000 Troy Sine, MD        Pt meds include: Statin :Yes Betablocker: No ASA: Yes Other anticoagulants/antiplatelets: none  Past Medical History:  Diagnosis Date  . Anemia 1964  . Arthritis    "touch starting to show up in the joints" (12/11/2013)  . COPD (chronic obstructive pulmonary disease) ([place])    "a little bit" (12/11/2013)  . Former heavy tobacco smoker   . GERD (gastroesophageal reflux disease)    Dr. Michail Sermon  . History of alcohol abuse     Past Surgical History:  Procedure Laterality Date  . APPENDECTOMY  12/11/2013   "laparoscopic"  . CHOLECYSTECTOMY  ~ 2010  . LAPAROSCOPIC APPENDECTOMY N/A 12/11/2013   Procedure: APPENDECTOMY LAPAROSCOPIC;  Surgeon: Imogene Burn. Georgette Dover, MD;  Location: [place];  Service: General;  Laterality: N/A;  . RIGHT/LEFT HEART CATH AND CORONARY ANGIOGRAPHY N/A 01/30/2017   Procedure: Right/Left Heart Cath and Coronary Angiography;  Surgeon: Troy Sine, MD;  Location: [facility];  Service: Cardiovascular;  Laterality: N/A;  . TONSILLECTOMY AND ADENOIDECTOMY  1950's    Social History Social History  Substance Use Topics  . Smoking status: Former Smoker    Packs/day: 2.00    Years: 52.00    Types: Cigarettes    Quit date: 09/21/2016  . Smokeless tobacco: Never Used  .  Alcohol use 12.0 oz/week    20 Glasses of wine per week    Family History Family History  Problem Relation Age of Onset  . Rheum arthritis Mother   . Cancer Father        BONE MARROW    Allergies  Allergen Reactions  . Ceclor [Cefaclor] Hives and Other (See Comments)    Dropped blood pressure     REVIEW OF SYSTEMS  General: [ ]  Weight loss, [ ]  Fever, [ ]  chills Neurologic: [ ]  Dizziness, [ ]  Blackouts, [ ]  Seizure [ ]  Stroke, [ ]  "Mini stroke", [ ]  Slurred speech, [ ]  Temporary blindness; [ ]  weakness in arms or legs, [ ]  Hoarseness [ ]  Dysphagia Cardiac: [ ]  Chest pain/pressure,  [ ]  Shortness of breath at rest [x ] Shortness of breath with exertion, [ ]  Atrial fibrillation or irregular heartbeat  Vascular: [ ]  Pain in legs with walking, [ ]  Pain in legs at rest, [ ]  Pain in legs at night,  [ ]  Non-healing ulcer, [ ]  Blood clot in vein/DVT,   Pulmonary: [ ]  Home oxygen, [ ]  Productive cough, [ ]  Coughing up blood, [ ]  Asthma,  [ ]  Wheezing [x ] COPD Musculoskeletal:  [x ] Arthritis, [ ]  Low back pain, [ ]  Joint pain Hematologic: [ ]  Easy Bruising, [x ] Anemia; [ ]  Hepatitis Gastrointestinal: [ ]  Blood in stool, [ ]  Gastroesophageal Reflux/heartburn, Urinary: [ ]  chronic Kidney disease, [ ]  on HD - [ ]  MWF or [ ]  TTHS, [ ]  Burning with urination, [ ]  Difficulty urinating Skin: [ ]  Rashes, [ ]  Wounds Psychological: [ ]  Anxiety, [ ]  Depression  Physical Examination Vitals:   01/31/17 1223 01/31/17 1709 01/31/17 1952 02/01/17 0441  BP: (!) 146/76 (!) 152/76 127/68 112/65  Pulse: 75 74 61 62  Resp: 18 18 18 18   Temp: 98.9 F (37.2 C) 98 F (36.7 C) 98.6 F (37 C) 97.8 F (36.6 C)  TempSrc: Oral Oral Oral Oral  SpO2: 97% 98% 94% 96%  Weight:    242 lb 8 oz (110 kg)  Height:       Body mass index is 31.99 kg/m.  General:  WDWN in NAD Gait: Normal HENT: WNL Eyes: Pupils equal Pulmonary: normal non-labored breathing , without Rales, rhonchi,  wheezing Cardiac: RRR, without  Murmurs, rubs or gallops; Positve carotid bruits Abdomen: soft, NT, no masses Skin: no rashes, ulcers noted;  no Gangrene , no cellulitis; no open wounds;   Vascular Exam/Pulses:palable radial, femoral,DP pulses B   Musculoskeletal: no muscle wasting or atrophy; no edema  Neurologic: A&O X 3; Appropriate Affect ;  SENSATION: normal; MOTOR FUNCTION: 5/5 Symmetric Speech is fluent/normal   Significant Diagnostic Studies: CBC Lab Results  Component Value Date   WBC 6.0 02/01/2017   HGB 11.0 (L) 02/01/2017   HCT 32.8 (L) 02/01/2017   MCV 84.5 02/01/2017   PLT 212 02/01/2017     BMET    Component Value Date/Time   NA 137 02/01/2017 0348   NA 137 01/24/2017 1535   K 4.2 02/01/2017 0348   CL 106 02/01/2017 0348   CO2 21 (L) 02/01/2017 0348   GLUCOSE 201 (H) 02/01/2017 0348   BUN 15 02/01/2017 0348   BUN 22 01/24/2017 1535   CREATININE 2.09 (H) 02/01/2017 0348   CALCIUM 8.8 (L) 02/01/2017 0348   GFRNONAA 30 (L) 02/01/2017 0348   GFRAA 35 (L) 02/01/2017 0348   Estimated Creatinine Clearance: 42.7 mL/min (  A) (by C-G formula based on SCr of 2.09 mg/dL (H)).  COAG Lab Results  Component Value Date   INR 0.9 01/24/2017     Non-Invasive Vascular Imaging:  Carotid duplex Right 60-79% and left 80-99%  ASSESSMENT/PLAN:  CAD with plan for CABG  Left > right carotid stenosis asymptomatic.   We will discuss a plan for left CEA in the near future.  If we can coordinate with his CABG.     [facility] 02/01/2017 9:55 AM   I have independently interviewed patient and agree with PA assessment and plan above. Very tight left carotid stenosis with nearly 80% right. Will plan carotid with concomitant cabg on 7.27 to be performed by Dr. Scot Dock. Discussed risks of stroke, cranial nerve injury, hematoma with benefits of preventing future stroke. He agrees to proceed and will get him scheduled.   Piero Mustard C. Donzetta Matters, MD [facility] [place] Office: [phone] Pager: [phone]

## 2017-02-01 NOTE — Discharge Summary (Addendum)
Discharge Summary    Patient ID: Paul Mcneil,  MRN: [id number], DOB/AGE: 1946/05/30 71 y.o.  Admit date: 01/30/2017 Discharge date: 02/05/2017  Primary Care Provider: Dorthy Cooler, [place] Primary Cardiologist: [name]  Discharge Diagnoses    Principal Problem:   Progressive angina [facility]) Active Problems:   CAD, multiple vessel   Abnormal nuclear stress test   CKD (chronic kidney disease) stage 3, GFR 30-59 ml/min   COPD GOLD II   Dyslipidemia, goal LDL below 70   Diabetes ([place])   Carotid artery disease (HCC)   Allergies Allergies  Allergen Reactions  . Ceclor [Cefaclor] Hives and Other (See Comments)    Dropped blood pressure    Diagnostic Studies/Procedures    LHC: 01/30/17  Conclusion     Ost RCA lesion, 80 %stenosed.  Mid RCA-2 lesion, 100 %stenosed.  Mid RCA-1 lesion, 90 %stenosed.  Ost LM lesion, 60 %stenosed.  Ost Ramus lesion, 70 %stenosed.  Ramus lesion, 80 %stenosed.  Prox Cx lesion, 90 %stenosed.  Mid Cx lesion, 80 %stenosed.  3rd Mrg lesion, 70 %stenosed.  Prox LAD to Mid LAD lesion, 40 %stenosed.  Mid LAD lesion, 40 %stenosed.   Normal right heart pressures.  Significant multivessel CAD with  50-60% smooth distal left main stenosis with the percent proximal mid LAD stenoses; 70% ostial and 80% proximal ramus intermediate stenoses; 90% eccentric proximal circumflex stenoses with 80 and 70% distal stenoses and 80% proximal, 90% proximal to mid and total occlusion of the mid RCA with left-to-right collaterals.  LVEDP 19 mmHg.  Left ventriculography was not done due to the patient's renal insufficiency.  RECOMMENDATION: The patient will be hydrated overnight.  He will be started on IV heparin.  Surgical consultation will be obtained for CABG revascularization surgery.    TTE: 01/31/17  Study Conclusions  - Left ventricle: Mild aneurysmal dilitation of inferior base. The   cavity size was normal. Wall thickness was increased in a  pattern   of mild LVH. Systolic function was normal. The estimated ejection   fraction was in the range of 60% to 65%. Doppler parameters are   consistent with abnormal left ventricular relaxation (grade 1   diastolic dysfunction). - Mitral valve: There was mild regurgitation. _____________   History of Present Illness     71 yo male with PMH of COPD, Anemia, and Tobacco Use who presented to the office with dyspnea on exertion, chest discomfort and abnormal EKG. Had an exercise myoview done on 01/16/17 that showed an EF of 45% with basal to mid inferior akinesis with ischemia. He was brought back to the office on 01/24/17 to discuss moving forward with [facility] given symptoms and stress findings.   Hospital Course     Consultants: TCTS, VVS  Underwent outpatient cath with Dr. Claiborne Billings on 01/29/17 showing multivessel disease with significant lesions including total occluded RCA and in the Lcx. He was continued on IV heparin post cath and TCTS consulted for surgery options. Post cath labs showed Cr 2.09, Hgb 11.0. Low dose bisoprolol was added and tolerated well.  Home statin was increased from Lipitor 49m to Lipitor 454m He was continued on ASA. Imdur 3079maily was also added. He was seen by Dr. HenRoxan Hockeyo felt appropriate candidate for CABG. Given his noted bilateral carotid bruits, VVS was consulted. Seen by Dr. CaiDonzetta Mattersd plan for CEA at the time of CABG. Of note Hgb A1c prior to admission was 8.7. He was seen by Diabetic coordinator who recommended starting patient on Glipizide  2.'5mg'$  daily with glucose meter at discharge. He was able to ambulate without any recurrent chest pain. All teams were in agreement that patient could be discharged home safely and come back in for surgery.   He was seen by Dr. [name] and determined stable for discharge home. Instructed on restrictions while waiting for CABG. Medications are listed below.  _____________  Discharge Vitals Blood pressure 112/65, pulse 62,  temperature 97.8 F (36.6 C), temperature source Oral, resp. rate 18, height '6\' 1"'$  (1.854 m), weight 242 lb 8 oz (110 kg), SpO2 96 %.  Filed Weights   01/30/17 1752 01/31/17 0500 02/01/17 0441  Weight: 243 lb 9.6 oz (110.5 kg) 245 lb 1.6 oz (111.2 kg) 242 lb 8 oz (110 kg)    Labs & Radiologic Studies    CBC No results for input(s): WBC, NEUTROABS, HGB, HCT, MCV, PLT in the last 72 hours. Basic Metabolic Panel No results for input(s): NA, K, CL, CO2, GLUCOSE, BUN, CREATININE, CALCIUM, MG, PHOS in the last 72 hours. Liver Function Tests No results for input(s): AST, ALT, ALKPHOS, BILITOT, PROT, ALBUMIN in the last 72 hours. No results for input(s): LIPASE, AMYLASE in the last 72 hours. Cardiac Enzymes No results for input(s): CKTOTAL, CKMB, CKMBINDEX, TROPONINI in the last 72 hours. BNP Invalid input(s): POCBNP D-Dimer No results for input(s): DDIMER in the last 72 hours. Hemoglobin A1C No results for input(s): HGBA1C in the last 72 hours. Fasting Lipid Panel No results for input(s): CHOL, HDL, LDLCALC, TRIG, CHOLHDL, LDLDIRECT in the last 72 hours. Thyroid Function Tests No results for input(s): TSH, T4TOTAL, T3FREE, THYROIDAB in the last 72 hours.  Invalid input(s): FREET3 _____________  No results found. Disposition   Pt is being discharged home today in good condition.  Follow-up Plans & Appointments    Follow-up Information    Short Stay Follow up on 02/15/2017.   Why:  Please arrive at 5:30am to be prepped for your surgery.  Contact information: [facility] Follow up on 02/07/2017.   Specialty:  Cardiology Why:  at 1:30pm for your vascular studies.  Contact information: [address] [place] [phone]         Discharge Instructions    Call MD for:  redness, tenderness, or signs of infection (pain, swelling, redness, odor or green/yellow discharge around incision site)     Complete by:  As directed    Diet - low sodium heart healthy    Complete by:  As directed    Discharge instructions    Complete by:  As directed    Radial Site Care Refer to this sheet in the next few weeks. These instructions provide you with information on caring for yourself after your procedure. Your caregiver may also give you more specific instructions. Your treatment has been planned according to current medical practices, but problems sometimes occur. Call your caregiver if you have any problems or questions after your procedure. HOME CARE INSTRUCTIONS You may shower the day after the procedure.Remove the bandage (dressing) and gently wash the site with plain soap and water.Gently pat the site dry.  Do not apply powder or lotion to the site.  Do not submerge the affected site in water for 3 to 5 days.  Inspect the site at least twice daily.  Do not flex or bend the affected arm for 24 hours.  No lifting over 5 pounds (2.3 kg) for 5 days  after your procedure.  Do not drive home if you are discharged the same day of the procedure. Have someone else drive you.  You may drive 24 hours after the procedure unless otherwise instructed by your caregiver.  What to expect: Any bruising will usually fade within 1 to 2 weeks.  Blood that collects in the tissue (hematoma) may be painful to the touch. It should usually decrease in size and tenderness within 1 to 2 weeks.  SEEK IMMEDIATE MEDICAL CARE IF: You have unusual pain at the radial site.  You have redness, warmth, swelling, or pain at the radial site.  You have drainage (other than a small amount of blood on the dressing).  You have chills.  You have a fever or persistent symptoms for more than 72 hours.  You have a fever and your symptoms suddenly get worse.  Your arm becomes pale, cool, tingly, or numb.  You have heavy bleeding from the site. Hold pressure on the site.    Please ensure that you come in for your follow up testing  prior to your surgery. Surgery is planned for 7/27. You need to avoid strenuous activity over the next 2 weeks while waiting for your surgery.  Call the office with any questions.   Increase activity slowly    Complete by:  As directed       Discharge Medications   Discharge Medication List as of 02/01/2017  4:05 PM    START taking these medications   Details  bisoprolol (ZEBETA) 5 MG tablet Take 1 tablet (5 mg total) by mouth daily., Starting Sat 02/02/2017, Normal    blood glucose meter kit and supplies Dispense based on patient and insurance preference. Use up to four times daily as directed. (FOR ICD-9 250.00, 250.01)., Print    glipiZIDE (GLUCOTROL) 5 MG tablet Take 0.5 tablets (2.5 mg total) by mouth daily before breakfast., Starting Fri 02/01/2017, Normal    isosorbide mononitrate (IMDUR) 30 MG 24 hr tablet Take 1 tablet (30 mg total) by mouth daily., Starting Sat 02/02/2017, Normal      CONTINUE these medications which have CHANGED   Details  atorvastatin (LIPITOR) 40 MG tablet Take 1 tablet (40 mg total) by mouth at bedtime., Starting Fri 02/01/2017, Normal      CONTINUE these medications which have NOT CHANGED   Details  aspirin EC 81 MG tablet Take 81 mg by mouth at bedtime. , Historical Med    esomeprazole (NEXIUM) 40 MG capsule Take 40 mg by mouth daily as needed (for acid reflux)., Until Discontinued, Historical Med    Garlic (GARLIQUE) 010 MG TBEC Take 400 mg by mouth every other day., Historical Med    Lactobacillus Casei-Folic Acid (RESTORA RX) 60-1.25 MG CAPS Take 1 tablet by mouth as needed., Historical Med    Omega-3 Fatty Acids (OMEGA-3 FISH OIL PO) Take 360 mg by mouth every other day., Historical Med         Aspirin prescribed at discharge?  Yes High Intensity Statin Prescribed? (Lipitor 40-32m or Crestor 20-456m: Yes Beta Blocker Prescribed? Yes For EF <40%, was ACEI/ARB Prescribed? No: CKD ADP Receptor Inhibitor Prescribed? (i.e. Plavix etc.-Includes  Medically Managed Patients): No: Planned for CABG For EF <40%, Aldosterone Inhibitor Prescribed? No EF ok Was EF assessed during THIS hospitalization? Yes Was Cardiac Rehab II ordered? (Included Medically managed Patients): No: Planned for CABG   Outstanding Labs/Studies   Please keep all follow up appts. Surgery is scheduled for 02/15/17.   Duration of  Discharge Encounter   Greater than 30 minutes including physician time.  Signed, Reino Bellis NP-C 02/05/2017, 4:25 PM

## 2017-02-01 NOTE — Progress Notes (Signed)
Pt is very anxious to d/c. Discussed sternal precautions, mobility post op, d/c planning and using IS. Gave IS and OHS booklet. Pt lives alone in duplex right beside his daughter. She does not work but has two Programmer, applications. He would like HHRN at d/c after OHS.  [place], ACSM 12:22 PM 02/01/2017

## 2017-02-01 NOTE — Progress Notes (Signed)
ANTICOAGULATION CONSULT NOTE - Follow Up Consult  Pharmacy Consult for Heparin Indication: chest pain/ACS, multi-vessel CAD   Allergies  Allergen Reactions  . Ceclor [Cefaclor] Hives and Other (See Comments)    Dropped blood pressure    Patient Measurements: Height: 6\' 1"  (185.4 cm) Weight: 242 lb 8 oz (110 kg) IBW/kg (Calculated) : 79.9 Heparin Dosing Weight: 100 kg  Vital Signs: Temp: 97.8 F (36.6 C) (07/13 0441) Temp Source: Oral (07/13 0441) BP: 112/65 (07/13 0441) Pulse Rate: 62 (07/13 0441)  Labs:  Recent Labs  01/31/17 0441 01/31/17 1059 01/31/17 1835 02/01/17 0348  HGB 11.4*  --   --  11.0*  HCT 34.0*  --   --  32.8*  PLT 215  --   --  212  HEPARINUNFRC  --  0.26* 0.45 0.59  CREATININE  --   --   --  2.09*    Estimated Creatinine Clearance: 42.7 mL/min (A) (by C-G formula based on SCr of 2.09 mg/dL (H)).  Assessment:  71 yr old male s/p cardiac cath 7/11 revealing multivessel CAD.  Cardiac surgery consulted. For CABG next week at earliest.      Heparin level remains therapeutic (0.59) on 1550 units/hr.  CBC stable.    Renal insufficiency.  Scr 1.91 on 7/5.  70 ml dye with cath 7/11. Scr 2.09 today.  Goal of Therapy:  Heparin level 0.3-0.7 units/ml Monitor platelets by anticoagulation protocol: Yes   Plan:   Continue heparin drip at 1550 units/hr.  Daily heparin level and CBC while on heparin.  Follow up plans.  Arty Baumgartner, [place] Pager: [phone] 02/01/2017,10:47 AM

## 2017-02-01 NOTE — Progress Notes (Signed)
  RD consulted for nutrition education regarding diabetes.  Pt acknowledges his abnormal A1c, and appears eager to learn about carbohydrate counting. States he is the main cook at home, offered cooking tips and tricks.   Lab Results  Component Value Date   HGBA1C 8.7 (H) 01/24/2017   RD provided "Carbohydrate Counting for People with Diabetes" handout from the Academy of Nutrition and Dietetics. Discussed different food groups and their effects on blood sugar, emphasizing carbohydrate-containing foods. Provided list of carbohydrates and recommended serving sizes of common foods.  Discussed importance of controlled and consistent carbohydrate intake throughout the day. Provided examples of ways to balance meals/snacks and encouraged intake of high-fiber, whole grain complex carbohydrates. Teach back method used.  Expect good compliance.  Body mass index is 31.99 kg/m. Pt meets criteria for obese based on current BMI.  Current diet order is Heart Healthy, patient is consuming approximately 100% of meals at this time. Labs and medications reviewed. No further nutrition interventions warranted at this time. RD contact information provided. If additional nutrition issues arise, please re-consult RD.  Mariana Single RD, LDN Pager # - [phone]

## 2017-02-05 DIAGNOSIS — N183 Chronic kidney disease, stage 3 (moderate): Secondary | ICD-10-CM

## 2017-02-05 DIAGNOSIS — N184 Chronic kidney disease, stage 4 (severe): Secondary | ICD-10-CM

## 2017-02-06 ENCOUNTER — Other Ambulatory Visit: Payer: Self-pay

## 2017-02-07 ENCOUNTER — Encounter (HOSPITAL_COMMUNITY): Payer: Medicare Other

## 2017-02-08 ENCOUNTER — Other Ambulatory Visit (HOSPITAL_COMMUNITY): Payer: Self-pay | Admitting: *Deleted

## 2017-02-11 ENCOUNTER — Other Ambulatory Visit (HOSPITAL_COMMUNITY): Payer: Self-pay | Admitting: Pharmacy Technician

## 2017-02-12 ENCOUNTER — Other Ambulatory Visit (HOSPITAL_COMMUNITY): Payer: Self-pay | Admitting: *Deleted

## 2017-02-12 NOTE — Pre-Procedure Instructions (Signed)
Lucas Winograd  02/12/2017      [id number] - [place], [place] [address] [place] [address] Phone: [phone] Fax: [phone]  [place], [place] [address] Phone: [phone] Fax: [phone]    Your procedure is scheduled on July 27  Report to [place] at [place].  Call this number if you have problems the morning of surgery:  [phone]   Remember:  Do not eat food or drink liquids after midnight.   Take these medicines the morning of surgery with A SIP OF WATER bisoprolol (ZEBETA), esomeprazole (NEXIUM), isosorbide mononitrate (IMDUR)  7 days prior to surgery STOP taking any Aleve, Naproxen, Ibuprofen, Motrin, Advil, Goody's, BC's, all herbal medications, fish oil, and all vitamins  Follow your doctors instructions regarding your Aspirin.  If no instructions were given by the doctor you will need to call the office to get instructions.  Your pre admission RN will also call for those instructions  WHAT DO I DO ABOUT MY DIABETES MEDICATION?   Marland Kitchen Do not take oral diabetes medicines (pills) the morning of surgery. glipiZIDE (GLUCOTROL)   How to Manage Your Diabetes Before and After Surgery  Why is it important to control my blood sugar before and after surgery? . Improving blood sugar levels before and after surgery helps healing and can limit problems. . A way of improving blood sugar control is eating a healthy diet by: o  Eating less sugar and carbohydrates o  Increasing activity/exercise o  Talking with your doctor about reaching your blood sugar goals . High blood sugars (greater than 180 mg/dL) can raise your risk of infections and slow your recovery, so you will need to focus on controlling your diabetes during the weeks before surgery. . Make sure that the doctor who  takes care of your diabetes knows about your planned surgery including the date and location.  How do I manage my blood sugar before surgery? . Check your blood sugar at least 4 times a day, starting 2 days before surgery, to make sure that the level is not too high or low. o Check your blood sugar the morning of your surgery when you wake up and every 2 hours until you get to the Short Stay unit. . If your blood sugar is less than 70 mg/dL, you will need to treat for low blood sugar: o Do not take insulin. o Treat a low blood sugar (less than 70 mg/dL) with  cup of clear juice (cranberry or apple), 4 glucose tablets, OR glucose gel. o Recheck blood sugar in 15 minutes after treatment (to make sure it is greater than 70 mg/dL). If your blood sugar is not greater than 70 mg/dL on recheck, call [phone] for further instructions. . Report your blood sugar to the short stay nurse when you get to Short Stay.  . If you are admitted to the hospital after surgery: o Your blood sugar will be checked by the staff and you will probably be given insulin after surgery (instead of oral diabetes medicines) to make sure you have good blood sugar levels. o The goal for blood sugar control after surgery is 80-180 mg/dL.     Do not wear jewelry  Do not wear lotions, powders, or cologne, or deoderant.  Men may shave face and neck.  Do not bring  valuables to the hospital.  [facility] is not responsible for any belongings or valuables.  Contacts, dentures or bridgework may not be worn into surgery.  Leave your suitcase in the car.  After surgery it may be brought to your room.  For patients admitted to the hospital, discharge time will be determined by your treatment team.  Patients discharged the day of surgery will not be allowed to drive home.    Special instructions:   [place]- Preparing For Surgery  Before surgery, you can play an important role. Because skin is not sterile, your skin needs  to be as free of germs as possible. You can reduce the number of germs on your skin by washing with CHG (chlorahexidine gluconate) Soap before surgery.  CHG is an antiseptic cleaner which kills germs and bonds with the skin to continue killing germs even after washing.  Please do not use if you have an allergy to CHG or antibacterial soaps. If your skin becomes reddened/irritated stop using the CHG.  Do not shave (including legs and underarms) for at least 48 hours prior to first CHG shower. It is OK to shave your face.  Please follow these instructions carefully.   1. Shower the NIGHT BEFORE SURGERY and the MORNING OF SURGERY with CHG.   2. If you chose to wash your hair, wash your hair first as usual with your normal shampoo.  3. After you shampoo, rinse your hair and body thoroughly to remove the shampoo.  4. Use CHG as you would any other liquid soap. You can apply CHG directly to the skin and wash gently with a scrungie or a clean washcloth.   5. Apply the CHG Soap to your body ONLY FROM THE NECK DOWN.  Do not use on open wounds or open sores. Avoid contact with your eyes, ears, mouth and genitals (private parts). Wash genitals (private parts) with your normal soap.  6. Wash thoroughly, paying special attention to the area where your surgery will be performed.  7. Thoroughly rinse your body with warm water from the neck down.  8. DO NOT shower/wash with your normal soap after using and rinsing off the CHG Soap.  9. Pat yourself dry with a CLEAN TOWEL.   10. Wear CLEAN PAJAMAS   11. Place CLEAN SHEETS on your bed the night of your first shower and DO NOT SLEEP WITH PETS.    Day of Surgery: Do not apply any deodorants/lotions. Please wear clean clothes to the hospital/surgery center.      Please read over the following fact sheets that you were given.

## 2017-02-13 ENCOUNTER — Ambulatory Visit (HOSPITAL_COMMUNITY)
Admission: RE | Admit: 2017-02-13 | Discharge: 2017-02-13 | Disposition: A | Discharge status: Home / Self Care | Payer: Medicare Other | Source: Ambulatory Visit | Attending: Thoracic Surgery (Cardiothoracic Vascular Surgery) | Admitting: Thoracic Surgery (Cardiothoracic Vascular Surgery)

## 2017-02-13 ENCOUNTER — Encounter (HOSPITAL_COMMUNITY): Payer: Self-pay

## 2017-02-13 ENCOUNTER — Encounter (HOSPITAL_COMMUNITY)
Admit: 2017-02-13 | Discharge: 2017-02-13 | Disposition: A | Discharge status: Home / Self Care | Payer: Medicare Other | Attending: Thoracic Surgery (Cardiothoracic Vascular Surgery) | Admitting: Thoracic Surgery (Cardiothoracic Vascular Surgery)

## 2017-02-13 DIAGNOSIS — R918 Other nonspecific abnormal finding of lung field: Secondary | ICD-10-CM

## 2017-02-13 DIAGNOSIS — E669 Obesity, unspecified: Secondary | ICD-10-CM | POA: Diagnosis not present

## 2017-02-13 DIAGNOSIS — I25118 Atherosclerotic heart disease of native coronary artery with other forms of angina pectoris: Secondary | ICD-10-CM

## 2017-02-13 DIAGNOSIS — Z01812 Encounter for preprocedural laboratory examination: Secondary | ICD-10-CM

## 2017-02-13 DIAGNOSIS — R001 Bradycardia, unspecified: Secondary | ICD-10-CM

## 2017-02-13 DIAGNOSIS — Z01810 Encounter for preprocedural cardiovascular examination: Secondary | ICD-10-CM | POA: Insufficient documentation

## 2017-02-13 DIAGNOSIS — I6523 Occlusion and stenosis of bilateral carotid arteries: Secondary | ICD-10-CM | POA: Diagnosis not present

## 2017-02-13 DIAGNOSIS — I517 Cardiomegaly: Secondary | ICD-10-CM | POA: Diagnosis not present

## 2017-02-13 DIAGNOSIS — E1122 Type 2 diabetes mellitus with diabetic chronic kidney disease: Secondary | ICD-10-CM | POA: Diagnosis not present

## 2017-02-13 DIAGNOSIS — J449 Chronic obstructive pulmonary disease, unspecified: Secondary | ICD-10-CM | POA: Diagnosis not present

## 2017-02-13 DIAGNOSIS — D62 Acute posthemorrhagic anemia: Secondary | ICD-10-CM | POA: Diagnosis not present

## 2017-02-13 DIAGNOSIS — E1165 Type 2 diabetes mellitus with hyperglycemia: Secondary | ICD-10-CM | POA: Diagnosis not present

## 2017-02-13 DIAGNOSIS — N183 Chronic kidney disease, stage 3 (moderate): Secondary | ICD-10-CM | POA: Diagnosis not present

## 2017-02-13 DIAGNOSIS — I2510 Atherosclerotic heart disease of native coronary artery without angina pectoris: Secondary | ICD-10-CM | POA: Diagnosis not present

## 2017-02-13 DIAGNOSIS — K219 Gastro-esophageal reflux disease without esophagitis: Secondary | ICD-10-CM | POA: Diagnosis not present

## 2017-02-13 DIAGNOSIS — M1990 Unspecified osteoarthritis, unspecified site: Secondary | ICD-10-CM | POA: Diagnosis not present

## 2017-02-13 HISTORY — DX: Dyspnea, unspecified: R06.00

## 2017-02-13 LAB — CBC
HCT: 37.9 % — ABNORMAL LOW (ref 39.0–52.0)
HEMOGLOBIN: 12.6 g/dL — AB (ref 13.0–17.0)
MCH: 28.2 pg (ref 26.0–34.0)
MCHC: 33.2 g/dL (ref 30.0–36.0)
MCV: 84.8 fL (ref 78.0–100.0)
Platelets: 269 10*3/uL (ref 150–400)
RBC: 4.47 MIL/uL (ref 4.22–5.81)
RDW: 13 % (ref 11.5–15.5)
WBC: 6.8 10*3/uL (ref 4.0–10.5)

## 2017-02-13 LAB — COMPREHENSIVE METABOLIC PANEL
ALK PHOS: 65 U/L (ref 38–126)
ALT: 32 U/L (ref 17–63)
AST: 27 U/L (ref 15–41)
Albumin: 3.7 g/dL (ref 3.5–5.0)
Anion gap: 10 (ref 5–15)
BUN: 28 mg/dL — ABNORMAL HIGH (ref 6–20)
CALCIUM: 9.2 mg/dL (ref 8.9–10.3)
CO2: 20 mmol/L — AB (ref 22–32)
CREATININE: 1.91 mg/dL — AB (ref 0.61–1.24)
Chloride: 107 mmol/L (ref 101–111)
GFR, EST AFRICAN AMERICAN: 39 mL/min — AB (ref >60)
GFR, EST NON AFRICAN AMERICAN: 34 mL/min — AB (ref >60)
Glucose, Bld: 151 mg/dL — ABNORMAL HIGH (ref 65–99)
Potassium: 4.4 mmol/L (ref 3.5–5.1)
Sodium: 137 mmol/L (ref 135–145)
Total Bilirubin: 0.6 mg/dL (ref 0.3–1.2)
Total Protein: 7 g/dL (ref 6.5–8.1)

## 2017-02-13 LAB — BLOOD GAS, ARTERIAL
Acid-base deficit: 4.2 mmol/L — ABNORMAL HIGH (ref 0.0–2.0)
Bicarbonate: 19.3 mmol/L — ABNORMAL LOW (ref 20.0–28.0)
Drawn by: 470591
FIO2: 21
O2 Saturation: 91 %
PCO2 ART: 29.4 mmHg — AB (ref 32.0–48.0)
PH ART: 7.433 (ref 7.350–7.450)
Patient temperature: 98.6
pO2, Arterial: 58.9 mmHg — ABNORMAL LOW (ref 83.0–108.0)

## 2017-02-13 LAB — URINALYSIS, ROUTINE W REFLEX MICROSCOPIC
BILIRUBIN URINE: NEGATIVE
Bacteria, UA: NONE SEEN
GLUCOSE, UA: 50 mg/dL — AB
HGB URINE DIPSTICK: NEGATIVE
Ketones, ur: NEGATIVE mg/dL
LEUKOCYTES UA: NEGATIVE
Nitrite: NEGATIVE
Protein, ur: >=300 mg/dL — AB
SPECIFIC GRAVITY, URINE: 1.023 (ref 1.005–1.030)
SQUAMOUS EPITHELIAL / LPF: NONE SEEN
pH: 5 (ref 5.0–8.0)

## 2017-02-13 LAB — APTT: APTT: 28 s (ref 24–36)

## 2017-02-13 LAB — SURGICAL PCR SCREEN
MRSA, PCR: NEGATIVE
Staphylococcus aureus: NEGATIVE

## 2017-02-13 LAB — GLUCOSE, CAPILLARY: Glucose-Capillary: 184 mg/dL — ABNORMAL HIGH (ref 65–99)

## 2017-02-13 LAB — PROTIME-INR
INR: 0.98
PROTHROMBIN TIME: 13 s (ref 11.4–15.2)

## 2017-02-13 LAB — ABO/RH: ABO/RH(D): A POS

## 2017-02-13 NOTE — Progress Notes (Addendum)
Called Paul Mcneil with abnormal labs left message for her  4:08 PM Spoke with Paul Mcneil she will let Dr. Roxan Hockey know

## 2017-02-13 NOTE — Progress Notes (Signed)
PCP - Dr. Dorthy Cooler at [place] [facility] [place] - Dr. [name]  Chest x-ray - 02/13/2017, results pending EKG - 02/13/2017 Stress Test - 01/16/2017 ECHO - 01/31/2017 Cardiac Cath - 01/30/2017  Sleep Study - patient denies   Fasting Blood Sugar - 120's Checks Blood Sugar ___1__ times a day     Patient denies shortness of breath, fever, cough and chest pain at PAT appointment   Patient verbalized understanding of instructions that were given to them at the PAT appointment. Patient was also instructed that they will need to review over the PAT instructions again at home before surgery.  Patient and patient's daughter verbalized they were instructed to continue ASA.

## 2017-02-14 MED ORDER — METOPROLOL TARTRATE 12.5 MG HALF TABLET: 12.5000 mg | ORAL_TABLET | Freq: Once | ORAL | Status: DC

## 2017-02-14 MED ORDER — NITROGLYCERIN IN D5W 200-5 MCG/ML-% IV SOLN
2.0000 ug/min | INTRAVENOUS | Status: DC
  Filled 2017-02-14: qty 250

## 2017-02-14 MED ORDER — POTASSIUM CHLORIDE 2 MEQ/ML IV SOLN
80.0000 meq | INTRAVENOUS | Status: DC
  Filled 2017-02-14: qty 40

## 2017-02-14 MED ORDER — LIDOCAINE 2% (20 MG/ML) 5 ML SYRINGE
INTRAMUSCULAR | Status: AC
  Filled 2017-02-14: qty 5

## 2017-02-14 MED ORDER — DEXMEDETOMIDINE HCL IN NACL 400 MCG/100ML IV SOLN
0.1000 ug/kg/h | INTRAVENOUS | Status: DC
  Filled 2017-02-14 (×2): qty 100

## 2017-02-14 MED ORDER — SODIUM CHLORIDE 0.9 % IV SOLN: INTRAVENOUS | Status: DC

## 2017-02-14 MED ORDER — SODIUM CHLORIDE 0.9 % IV SOLN
INTRAVENOUS | Status: DC
Start: 2017-02-15 — End: 2017-02-15
  Filled 2017-02-14: qty 30

## 2017-02-14 MED ORDER — SODIUM CHLORIDE 0.9 % IV SOLN
INTRAVENOUS | Status: DC
  Filled 2017-02-14: qty 1

## 2017-02-14 MED ORDER — SODIUM CHLORIDE 0.9 % IV SOLN
30.0000 ug/min | INTRAVENOUS | Status: DC
  Filled 2017-02-14: qty 2

## 2017-02-14 MED ORDER — DEXTROSE 5 % IV SOLN
0.0000 ug/min | INTRAVENOUS | Status: DC
  Filled 2017-02-14: qty 4

## 2017-02-14 MED ORDER — PHENYLEPHRINE 40 MCG/ML (10ML) SYRINGE FOR IV PUSH (FOR BLOOD PRESSURE SUPPORT)
PREFILLED_SYRINGE | INTRAVENOUS | Status: AC
  Filled 2017-02-14: qty 10

## 2017-02-14 MED ORDER — DOPAMINE-DEXTROSE 3.2-5 MG/ML-% IV SOLN
0.0000 ug/kg/min | INTRAVENOUS | Status: DC
  Filled 2017-02-14: qty 250

## 2017-02-14 MED ORDER — TRANEXAMIC ACID (OHS) PUMP PRIME SOLUTION
2.0000 mg/kg | INTRAVENOUS | Status: DC
  Filled 2017-02-14: qty 2.2

## 2017-02-14 MED ORDER — LEVOFLOXACIN IN D5W 500 MG/100ML IV SOLN
500.0000 mg | INTRAVENOUS | Status: AC
Start: 2017-02-15 — End: 2017-02-15
  Administered 2017-02-15: 500 mg via INTRAVENOUS
  Filled 2017-02-14: qty 100

## 2017-02-14 MED ORDER — PLASMA-LYTE 148 IV SOLN
INTRAVENOUS | Status: AC
  Administered 2017-02-15: 500 mL
  Filled 2017-02-14: qty 2.5

## 2017-02-14 MED ORDER — VANCOMYCIN HCL 10 G IV SOLR
1500.0000 mg | INTRAVENOUS | Status: AC
  Administered 2017-02-15: 1500 mg via INTRAVENOUS
  Filled 2017-02-14: qty 1500

## 2017-02-14 MED ORDER — CHLORHEXIDINE GLUCONATE 0.12 % MT SOLN
15.0000 mL | Freq: Once | OROMUCOSAL | Status: AC
  Administered 2017-02-15: 15 mL via OROMUCOSAL
  Filled 2017-02-14: qty 15

## 2017-02-14 MED ORDER — TRANEXAMIC ACID (OHS) BOLUS VIA INFUSION
15.0000 mg/kg | INTRAVENOUS | Status: AC
  Administered 2017-02-15: 1650 mg via INTRAVENOUS
  Filled 2017-02-14: qty 1650

## 2017-02-14 MED ORDER — MAGNESIUM SULFATE 50 % IJ SOLN
40.0000 meq | INTRAMUSCULAR | Status: DC
  Filled 2017-02-14: qty 10

## 2017-02-14 MED ORDER — TRANEXAMIC ACID 1000 MG/10ML IV SOLN
1.5000 mg/kg/h | INTRAVENOUS | Status: AC
  Administered 2017-02-15: 1.5 mg/kg/h via INTRAVENOUS
  Filled 2017-02-14: qty 25

## 2017-02-14 NOTE — Anesthesia Preprocedure Evaluation (Addendum)
Anesthesia Evaluation  Patient identified by MRN, date of birth, ID band Patient awake    Reviewed: Allergy & Precautions, NPO status , Patient's Chart, lab work & pertinent test results, reviewed documented beta blocker date and time   History of Anesthesia Complications Negative for: history of anesthetic complications  Airway Mallampati: I  TM Distance: >3 FB Neck ROM: Full    Dental  (+) Edentulous Upper, Edentulous Lower   Pulmonary shortness of breath, COPD, former smoker,    breath sounds clear to auscultation       Cardiovascular hypertension, Pt. on home beta blockers and Pt. on medications (-) angina+ CAD (50-60% L main, 3v ASCAD) and + Peripheral Vascular Disease   Rhythm:Regular Rate:Normal  01/31/17 ECHO: EF 60-65%, mild MR   Neuro/Psych negative neurological ROS     GI/Hepatic GERD  Medicated and Controlled,(+)     substance abuse  alcohol use,   Endo/Other  diabetes (glu 127), Oral Hypoglycemic AgentsMorbid obesity  Renal/GU Renal InsufficiencyRenal disease (creat 1.91)     Musculoskeletal  (+) Arthritis ,   Abdominal (+) + obese,   Peds  Hematology   Anesthesia Other Findings   Reproductive/Obstetrics                            Anesthesia Physical Anesthesia Plan  ASA: III  Anesthesia Plan: General   Post-op Pain Management:    Induction: Intravenous  PONV Risk Score and Plan: 2 and Ondansetron and Treatment may vary due to age or medical condition  Airway Management Planned: Oral ETT  Additional Equipment: Arterial line, PA Cath, TEE and Ultrasound Guidance Line Placement  Intra-op Plan:   Post-operative Plan: Post-operative intubation/ventilation  Informed Consent: I have reviewed the patients History and Physical, chart, labs and discussed the procedure including the risks, benefits and alternatives for the proposed anesthesia with the patient or authorized  representative who has indicated his/her understanding and acceptance.     Plan Discussed with: CRNA and Surgeon  Anesthesia Plan Comments: (Plan routine monitors, A line, PA cath, GETA with TEE and post op ventilation)        Anesthesia Quick Evaluation

## 2017-02-14 NOTE — Progress Notes (Addendum)
Anesthesia Chart Review: Patient is a 71 year old male scheduled for CABG (Dr. Roxan Hockey) and left carotid endarterectomy (Dr. Scot Dock) on 02/15/17.  History includes former smoker (quit 09/21/16), COPD, CAD, carotid artery disease, DM2, HLD, CKD stage II, anemia, GERD, ETOH abuse (sober since ~ 08/2016), exertional dyspnea, cholecystectomy, appendectomy, T&A. LUL lung lesion on 12/2016 CT with 3 month follow-up recommended, pulmonology following. Quantiferon Gamma Release Assay was negative on 01/10/17.  PCP is Dr. Dorthy Cooler at [facility]. Cardiologist is Dr. [name]. Pulmonologist is Dr. Melvyn Novas.  Meds include ASA 81 mg, Lipitor, Zebeta, Nexium, Garlique, glipizide, Imdur, fish oil.  BP 132/67   Pulse (!) 58   Temp 36.6 C   Resp 18   Ht 6\' 1"  (1.854 m)   Wt 242 lb 9.6 oz (110 kg)   SpO2 98%   BMI 32.01 kg/m    EKG 02/13/17: SB, inferior infarct (age undetermined).  Cardiac cath 01/30/17:  Ost RCA lesion, 80 %stenosed.  Mid RCA-2 lesion, 100 %stenosed.  Mid RCA-1 lesion, 90 %stenosed.  Ost LM lesion, 60 %stenosed.  Ost Ramus lesion, 70 %stenosed.  Ramus lesion, 80 %stenosed.  Prox Cx lesion, 90 %stenosed.  Mid Cx lesion, 80 %stenosed.  3rd Mrg lesion, 70 %stenosed.  Prox LAD to Mid LAD lesion, 40 %stenosed.  Mid LAD lesion, 40 %stenosed.  Normal right heart pressures. Significant multivessel CAD with  50-60% smooth distal left main stenosis with the percent proximal mid LAD stenoses; 70% ostial and 80% proximal ramus intermediate stenoses; 90% eccentric proximal circumflex stenoses with 80 and 70% distal stenoses and 80% proximal, 90% proximal to mid and total occlusion of the mid RCA with left-to-right collaterals. LVEDP 19 mmHg.  Left ventriculography was not done due to the patient's renal insufficiency. RECOMMENDATION: Surgical consultation will be obtained for CABG revascularization surgery.    Echo 01/31/17: Study Conclusions - Left ventricle: Mild aneurysmal  dilitation of inferior base. The   cavity size was normal. Wall thickness was increased in a pattern   of mild LVH. Systolic function was normal. The estimated ejection   fraction was in the range of 60% to 65%. Doppler parameters are   consistent with abnormal left ventricular relaxation (grade 1   diastolic dysfunction). - Mitral valve: There was mild regurgitation.  Carotid U/S 01/31/17: Summary: Findings suggest upper range 60-79% right internal carotid artery stenosis and 80-99% left internal carotid artery stenosis. The left external carotid artery exhibits elevated velocities suggestive of >50% stenosis. Vertebral arteries are patent with antegrade flow.  Chest CT 12/25/16: IMPRESSION: 1. No focal pneumonia visualized. 2. Moderate emphysema. 10 mm small cavitary lesion in the left upper lobe with surrounding mild amount of soft tissue density. Findings could be secondary to small focus of cavitary infection versus cavitary nodule. Consider one of the following in 3 months for both low-risk and high-risk individuals: (a) repeat chest CT, (b) follow-up PET-CT, or (c) tissue sampling. This recommendation follows the consensus statement: Guidelines for Management of Incidental Pulmonary Nodules Detected on CT Images: From the Fleischner Society 2017; Radiology 2017; 284:228-243. Additional small described pulmonary nodules could also be re- evaluated at follow-up chest CT. 3. Mild right hilar adenopathy.  CXR 02/13/17: IMPRESSION: Chronic bronchitic-smoking related changes, stable. There is no acute cardiopulmonary abnormality.  Spirometry 01/10/17: FVC 4.08 (80%), FEV1 2.54 (67%), FEF25-75 1.08 (38%). Moderate airway obstruction with low vital capacity.  Preoperative labs noted. BUN 28, Cr 1.91 (Cr 1.80-2.09 since 01/10/17). Glucose 151. AST/ALT, PT/PTT WNL. H/H 12.6/37.9, PLT 269. A1c  8.7 01/24/17. ABG showed pCO2 29.4, pO2 58.9, pH 7.43, HCO3 19.3. (PAT RN called abnormal labs to  TCTS and Dr. Roxan Hockey marked as reviewed.)  If no acute changes then I anticipate that he can proceed as planned.  [facility] Short Stay Center/Anesthesiology Phone [phone] 02/14/2017 7:44 AM

## 2017-02-15 ENCOUNTER — Inpatient Hospital Stay (HOSPITAL_COMMUNITY)
Admission: RE | Admit: 2017-02-15 | Discharge: 2017-02-20 | DRG: 236 | Disposition: A | Discharge status: Home / Self Care | Payer: Medicare Other | Source: Ambulatory Visit | Attending: Thoracic Surgery (Cardiothoracic Vascular Surgery) | Admitting: Thoracic Surgery (Cardiothoracic Vascular Surgery)

## 2017-02-15 ENCOUNTER — Encounter (HOSPITAL_COMMUNITY): Payer: Self-pay | Admitting: *Deleted

## 2017-02-15 ENCOUNTER — Inpatient Hospital Stay (HOSPITAL_COMMUNITY): Payer: Medicare Other

## 2017-02-15 ENCOUNTER — Inpatient Hospital Stay (HOSPITAL_COMMUNITY): Payer: Medicare Other | Admitting: Certified Registered Nurse Anesthetist

## 2017-02-15 ENCOUNTER — Ambulatory Visit (HOSPITAL_COMMUNITY): Payer: Medicare Other

## 2017-02-15 ENCOUNTER — Inpatient Hospital Stay (HOSPITAL_COMMUNITY)
Admission: RE | Disposition: A | Discharge status: Home / Self Care | Payer: Self-pay | Source: Ambulatory Visit | Attending: Thoracic Surgery (Cardiothoracic Vascular Surgery)

## 2017-02-15 DIAGNOSIS — Z951 Presence of aortocoronary bypass graft: Secondary | ICD-10-CM

## 2017-02-15 DIAGNOSIS — D62 Acute posthemorrhagic anemia: Secondary | ICD-10-CM | POA: Diagnosis not present

## 2017-02-15 DIAGNOSIS — Z79899 Other long term (current) drug therapy: Secondary | ICD-10-CM | POA: Diagnosis not present

## 2017-02-15 DIAGNOSIS — E669 Obesity, unspecified: Secondary | ICD-10-CM | POA: Diagnosis not present

## 2017-02-15 DIAGNOSIS — N183 Chronic kidney disease, stage 3 (moderate): Secondary | ICD-10-CM | POA: Diagnosis not present

## 2017-02-15 DIAGNOSIS — E1165 Type 2 diabetes mellitus with hyperglycemia: Secondary | ICD-10-CM | POA: Diagnosis present

## 2017-02-15 DIAGNOSIS — Z888 Allergy status to other drugs, medicaments and biological substances status: Secondary | ICD-10-CM | POA: Diagnosis not present

## 2017-02-15 DIAGNOSIS — R911 Solitary pulmonary nodule: Secondary | ICD-10-CM | POA: Diagnosis present

## 2017-02-15 DIAGNOSIS — M1990 Unspecified osteoarthritis, unspecified site: Secondary | ICD-10-CM | POA: Diagnosis not present

## 2017-02-15 DIAGNOSIS — K219 Gastro-esophageal reflux disease without esophagitis: Secondary | ICD-10-CM | POA: Diagnosis not present

## 2017-02-15 DIAGNOSIS — I2510 Atherosclerotic heart disease of native coronary artery without angina pectoris: Principal | ICD-10-CM | POA: Diagnosis present

## 2017-02-15 DIAGNOSIS — Z7982 Long term (current) use of aspirin: Secondary | ICD-10-CM | POA: Diagnosis not present

## 2017-02-15 DIAGNOSIS — I6522 Occlusion and stenosis of left carotid artery: Secondary | ICD-10-CM | POA: Diagnosis not present

## 2017-02-15 DIAGNOSIS — Z6832 Body mass index (BMI) 32.0-32.9, adult: Secondary | ICD-10-CM | POA: Diagnosis not present

## 2017-02-15 DIAGNOSIS — E1122 Type 2 diabetes mellitus with diabetic chronic kidney disease: Secondary | ICD-10-CM | POA: Diagnosis present

## 2017-02-15 DIAGNOSIS — I6523 Occlusion and stenosis of bilateral carotid arteries: Secondary | ICD-10-CM | POA: Diagnosis not present

## 2017-02-15 DIAGNOSIS — R001 Bradycardia, unspecified: Secondary | ICD-10-CM | POA: Diagnosis not present

## 2017-02-15 DIAGNOSIS — I340 Nonrheumatic mitral (valve) insufficiency: Secondary | ICD-10-CM | POA: Diagnosis not present

## 2017-02-15 DIAGNOSIS — J449 Chronic obstructive pulmonary disease, unspecified: Secondary | ICD-10-CM | POA: Diagnosis present

## 2017-02-15 DIAGNOSIS — I10 Essential (primary) hypertension: Secondary | ICD-10-CM | POA: Diagnosis not present

## 2017-02-15 DIAGNOSIS — I25118 Atherosclerotic heart disease of native coronary artery with other forms of angina pectoris: Secondary | ICD-10-CM

## 2017-02-15 DIAGNOSIS — Z87891 Personal history of nicotine dependence: Secondary | ICD-10-CM | POA: Diagnosis not present

## 2017-02-15 DIAGNOSIS — I517 Cardiomegaly: Secondary | ICD-10-CM | POA: Diagnosis not present

## 2017-02-15 DIAGNOSIS — J9811 Atelectasis: Secondary | ICD-10-CM | POA: Diagnosis not present

## 2017-02-15 HISTORY — PX: ENDARTERECTOMY: SHX5162

## 2017-02-15 HISTORY — PX: CORONARY ARTERY BYPASS GRAFT: SHX141

## 2017-02-15 HISTORY — PX: TEE WITHOUT CARDIOVERSION: SHX5443

## 2017-02-15 HISTORY — DX: Presence of aortocoronary bypass graft: Z95.1

## 2017-02-15 LAB — POCT I-STAT 3, ART BLOOD GAS (G3+)
ACID-BASE DEFICIT: 4 mmol/L — AB (ref 0.0–2.0)
ACID-BASE DEFICIT: 1 mmol/L (ref 0.0–2.0)
ACID-BASE DEFICIT: 3 mmol/L — AB (ref 0.0–2.0)
Acid-base deficit: 6 mmol/L — ABNORMAL HIGH (ref 0.0–2.0)
Acid-base deficit: 6 mmol/L — ABNORMAL HIGH (ref 0.0–2.0)
Acid-base deficit: 4 mmol/L — ABNORMAL HIGH (ref 0.0–2.0)
Acid-base deficit: 4 mmol/L — ABNORMAL HIGH (ref 0.0–2.0)
BICARBONATE: 21.3 mmol/L (ref 20.0–28.0)
BICARBONATE: 23.3 mmol/L (ref 20.0–28.0)
BICARBONATE: 23.5 mmol/L (ref 20.0–28.0)
BICARBONATE: 22.2 mmol/L (ref 20.0–28.0)
Bicarbonate: 21.4 mmol/L (ref 20.0–28.0)
Bicarbonate: 21.2 mmol/L (ref 20.0–28.0)
Bicarbonate: 23 mmol/L (ref 20.0–28.0)
O2 SAT: 99 %
O2 Saturation: 100 %
O2 Saturation: 97 %
O2 Saturation: 100 %
O2 Saturation: 97 %
O2 Saturation: 97 %
O2 Saturation: 94 %
PCO2 ART: 50.7 mmHg — AB (ref 32.0–48.0)
PCO2 ART: 36.7 mmHg (ref 32.0–48.0)
PCO2 ART: 51.4 mmHg — AB (ref 32.0–48.0)
PH ART: 7.225 — AB (ref 7.350–7.450)
PH ART: 7.23 — AB (ref 7.350–7.450)
PH ART: 7.373 (ref 7.350–7.450)
PH ART: 7.25 — AB (ref 7.350–7.450)
PO2 ART: 388 mmHg — AB (ref 83.0–108.0)
PO2 ART: 98 mmHg (ref 83.0–108.0)
Patient temperature: 36.5
TCO2: 23 mmol/L (ref 0–100)
TCO2: 23 mmol/L (ref 0–100)
TCO2: 22 mmol/L (ref 0–100)
TCO2: 25 mmol/L (ref 0–100)
TCO2: 25 mmol/L (ref 0–100)
TCO2: 25 mmol/L (ref 0–100)
TCO2: 23 mmol/L (ref 0–100)
pCO2 arterial: 51.6 mmHg — ABNORMAL HIGH (ref 32.0–48.0)
pCO2 arterial: 53.2 mmHg — ABNORMAL HIGH (ref 32.0–48.0)
pCO2 arterial: 38.6 mmHg (ref 32.0–48.0)
pCO2 arterial: 37.5 mmHg (ref 32.0–48.0)
pH, Arterial: 7.256 — ABNORMAL LOW (ref 7.350–7.450)
pH, Arterial: 7.39 (ref 7.350–7.450)
pH, Arterial: 7.379 (ref 7.350–7.450)
pO2, Arterial: 315 mmHg — ABNORMAL HIGH (ref 83.0–108.0)
pO2, Arterial: 112 mmHg — ABNORMAL HIGH (ref 83.0–108.0)
pO2, Arterial: 171 mmHg — ABNORMAL HIGH (ref 83.0–108.0)
pO2, Arterial: 91 mmHg (ref 83.0–108.0)
pO2, Arterial: 70 mmHg — ABNORMAL LOW (ref 83.0–108.0)

## 2017-02-15 LAB — POCT I-STAT, CHEM 8
BUN: 30 mg/dL — ABNORMAL HIGH (ref 6–20)
BUN: 29 mg/dL — ABNORMAL HIGH (ref 6–20)
BUN: 29 mg/dL — ABNORMAL HIGH (ref 6–20)
BUN: 25 mg/dL — ABNORMAL HIGH (ref 6–20)
BUN: 29 mg/dL — ABNORMAL HIGH (ref 6–20)
BUN: 30 mg/dL — ABNORMAL HIGH (ref 6–20)
BUN: 29 mg/dL — ABNORMAL HIGH (ref 6–20)
CALCIUM ION: 1.23 mmol/L (ref 1.15–1.40)
CALCIUM ION: 1.23 mmol/L (ref 1.15–1.40)
CHLORIDE: 105 mmol/L (ref 101–111)
CHLORIDE: 104 mmol/L (ref 101–111)
CHLORIDE: 107 mmol/L (ref 101–111)
CHLORIDE: 105 mmol/L (ref 101–111)
CREATININE: 1.8 mg/dL — AB (ref 0.61–1.24)
CREATININE: 1.8 mg/dL — AB (ref 0.61–1.24)
CREATININE: 1.6 mg/dL — AB (ref 0.61–1.24)
CREATININE: 1.9 mg/dL — AB (ref 0.61–1.24)
Calcium, Ion: 1.25 mmol/L (ref 1.15–1.40)
Calcium, Ion: 0.98 mmol/L — ABNORMAL LOW (ref 1.15–1.40)
Calcium, Ion: 1.08 mmol/L — ABNORMAL LOW (ref 1.15–1.40)
Calcium, Ion: 1.09 mmol/L — ABNORMAL LOW (ref 1.15–1.40)
Calcium, Ion: 1.16 mmol/L (ref 1.15–1.40)
Chloride: 105 mmol/L (ref 101–111)
Chloride: 106 mmol/L (ref 101–111)
Chloride: 106 mmol/L (ref 101–111)
Creatinine, Ser: 1.7 mg/dL — ABNORMAL HIGH (ref 0.61–1.24)
Creatinine, Ser: 1.4 mg/dL — ABNORMAL HIGH (ref 0.61–1.24)
Creatinine, Ser: 1.8 mg/dL — ABNORMAL HIGH (ref 0.61–1.24)
GLUCOSE: 158 mg/dL — AB (ref 65–99)
GLUCOSE: 181 mg/dL — AB (ref 65–99)
GLUCOSE: 174 mg/dL — AB (ref 65–99)
GLUCOSE: 235 mg/dL — AB (ref 65–99)
GLUCOSE: 252 mg/dL — AB (ref 65–99)
GLUCOSE: 111 mg/dL — AB (ref 65–99)
Glucose, Bld: 152 mg/dL — ABNORMAL HIGH (ref 65–99)
HCT: 31 % — ABNORMAL LOW (ref 39.0–52.0)
HCT: 29 % — ABNORMAL LOW (ref 39.0–52.0)
HCT: 25 % — ABNORMAL LOW (ref 39.0–52.0)
HEMATOCRIT: 30 % — AB (ref 39.0–52.0)
HEMATOCRIT: 23 % — AB (ref 39.0–52.0)
HEMATOCRIT: 24 % — AB (ref 39.0–52.0)
HEMATOCRIT: 27 % — AB (ref 39.0–52.0)
HEMOGLOBIN: 10.5 g/dL — AB (ref 13.0–17.0)
HEMOGLOBIN: 10.2 g/dL — AB (ref 13.0–17.0)
HEMOGLOBIN: 9.9 g/dL — AB (ref 13.0–17.0)
HEMOGLOBIN: 7.8 g/dL — AB (ref 13.0–17.0)
HEMOGLOBIN: 8.2 g/dL — AB (ref 13.0–17.0)
Hemoglobin: 8.5 g/dL — ABNORMAL LOW (ref 13.0–17.0)
Hemoglobin: 9.2 g/dL — ABNORMAL LOW (ref 13.0–17.0)
POTASSIUM: 4.9 mmol/L (ref 3.5–5.1)
POTASSIUM: 5.1 mmol/L (ref 3.5–5.1)
POTASSIUM: 5.4 mmol/L — AB (ref 3.5–5.1)
POTASSIUM: 7.3 mmol/L — AB (ref 3.5–5.1)
POTASSIUM: 6.8 mmol/L — AB (ref 3.5–5.1)
Potassium: 4.8 mmol/L (ref 3.5–5.1)
Potassium: 4.5 mmol/L (ref 3.5–5.1)
SODIUM: 139 mmol/L (ref 135–145)
SODIUM: 139 mmol/L (ref 135–145)
Sodium: 140 mmol/L (ref 135–145)
Sodium: 138 mmol/L (ref 135–145)
Sodium: 134 mmol/L — ABNORMAL LOW (ref 135–145)
Sodium: 137 mmol/L (ref 135–145)
Sodium: 140 mmol/L (ref 135–145)
TCO2: 25 mmol/L (ref 0–100)
TCO2: 23 mmol/L (ref 0–100)
TCO2: 23 mmol/L (ref 0–100)
TCO2: 24 mmol/L (ref 0–100)
TCO2: 24 mmol/L (ref 0–100)
TCO2: 24 mmol/L (ref 0–100)
TCO2: 22 mmol/L (ref 0–100)

## 2017-02-15 LAB — PLATELET COUNT: Platelets: 202 10*3/uL (ref 150–400)

## 2017-02-15 LAB — POCT I-STAT 4, (NA,K, GLUC, HGB,HCT)
GLUCOSE: 167 mg/dL — AB (ref 65–99)
HEMATOCRIT: 26 % — AB (ref 39.0–52.0)
HEMOGLOBIN: 8.8 g/dL — AB (ref 13.0–17.0)
Potassium: 5.4 mmol/L — ABNORMAL HIGH (ref 3.5–5.1)
Sodium: 141 mmol/L (ref 135–145)

## 2017-02-15 LAB — HEMOGLOBIN AND HEMATOCRIT, BLOOD
HEMATOCRIT: 22.5 % — AB (ref 39.0–52.0)
HEMOGLOBIN: 7.7 g/dL — AB (ref 13.0–17.0)

## 2017-02-15 LAB — GLUCOSE, CAPILLARY
GLUCOSE-CAPILLARY: 105 mg/dL — AB (ref 65–99)
GLUCOSE-CAPILLARY: 111 mg/dL — AB (ref 65–99)
Glucose-Capillary: 127 mg/dL — ABNORMAL HIGH (ref 65–99)
Glucose-Capillary: 110 mg/dL — ABNORMAL HIGH (ref 65–99)
Glucose-Capillary: 113 mg/dL — ABNORMAL HIGH (ref 65–99)
Glucose-Capillary: 103 mg/dL — ABNORMAL HIGH (ref 65–99)
Glucose-Capillary: 111 mg/dL — ABNORMAL HIGH (ref 65–99)

## 2017-02-15 LAB — CBC
HCT: 29.4 % — ABNORMAL LOW (ref 39.0–52.0)
HCT: 30.4 % — ABNORMAL LOW (ref 39.0–52.0)
Hemoglobin: 9.8 g/dL — ABNORMAL LOW (ref 13.0–17.0)
Hemoglobin: 10 g/dL — ABNORMAL LOW (ref 13.0–17.0)
MCH: 28.3 pg (ref 26.0–34.0)
MCH: 27.8 pg (ref 26.0–34.0)
MCHC: 33.3 g/dL (ref 30.0–36.0)
MCHC: 32.9 g/dL (ref 30.0–36.0)
MCV: 85 fL (ref 78.0–100.0)
MCV: 84.4 fL (ref 78.0–100.0)
PLATELETS: 155 10*3/uL (ref 150–400)
PLATELETS: 197 10*3/uL (ref 150–400)
RBC: 3.46 MIL/uL — ABNORMAL LOW (ref 4.22–5.81)
RBC: 3.6 MIL/uL — ABNORMAL LOW (ref 4.22–5.81)
RDW: 13 % (ref 11.5–15.5)
RDW: 13 % (ref 11.5–15.5)
WBC: 8 10*3/uL (ref 4.0–10.5)
WBC: 12.1 10*3/uL — AB (ref 4.0–10.5)

## 2017-02-15 LAB — PREPARE RBC (CROSSMATCH)

## 2017-02-15 LAB — CREATININE, SERUM
CREATININE: 1.96 mg/dL — AB (ref 0.61–1.24)
GFR, EST AFRICAN AMERICAN: 38 mL/min — AB (ref >60)
GFR, EST NON AFRICAN AMERICAN: 33 mL/min — AB (ref >60)

## 2017-02-15 LAB — MAGNESIUM: MAGNESIUM: 2.8 mg/dL — AB (ref 1.7–2.4)

## 2017-02-15 LAB — APTT: aPTT: 35 seconds (ref 24–36)

## 2017-02-15 LAB — PROTIME-INR
INR: 1.26
Prothrombin Time: 15.9 seconds — ABNORMAL HIGH (ref 11.4–15.2)

## 2017-02-15 SURGERY — CORONARY ARTERY BYPASS GRAFTING (CABG)
Anesthesia: General | Site: Neck

## 2017-02-15 MED ORDER — SODIUM CHLORIDE 0.9% FLUSH: 3.0000 mL | INTRAVENOUS | Status: DC | PRN

## 2017-02-15 MED ORDER — ORAL CARE MOUTH RINSE
15.0000 mL | Freq: Two times a day (BID) | OROMUCOSAL | Status: DC
  Administered 2017-02-16 – 2017-02-19 (×7): 15 mL via OROMUCOSAL

## 2017-02-15 MED ORDER — METOPROLOL TARTRATE 25 MG/10 ML ORAL SUSPENSION: 12.5000 mg | Freq: Two times a day (BID) | ORAL | Status: DC

## 2017-02-15 MED ORDER — HEMOSTATIC AGENTS (NO CHARGE) OPTIME
TOPICAL | Status: DC | PRN
  Administered 2017-02-15: 1 via TOPICAL

## 2017-02-15 MED ORDER — MORPHINE SULFATE (PF) 2 MG/ML IV SOLN: 1.0000 mg | INTRAVENOUS | Status: DC | PRN

## 2017-02-15 MED ORDER — ALBUMIN HUMAN 5 % IV SOLN: 250.0000 mL | INTRAVENOUS | Status: AC | PRN

## 2017-02-15 MED ORDER — CHLORHEXIDINE GLUCONATE 4 % EX LIQD: 60.0000 mL | Freq: Once | CUTANEOUS | Status: DC

## 2017-02-15 MED ORDER — HEPARIN SODIUM (PORCINE) 1000 UNIT/ML IJ SOLN
INTRAMUSCULAR | Status: DC | PRN
  Administered 2017-02-15: 10 mL via INTRAVENOUS
  Administered 2017-02-15: 45 mL via INTRAVENOUS

## 2017-02-15 MED ORDER — ORAL CARE MOUTH RINSE: 15.0000 mL | Freq: Four times a day (QID) | OROMUCOSAL | Status: DC

## 2017-02-15 MED ORDER — FENTANYL CITRATE (PF) 250 MCG/5ML IJ SOLN
INTRAMUSCULAR | Status: AC
  Filled 2017-02-15: qty 20

## 2017-02-15 MED ORDER — MIDAZOLAM HCL 2 MG/2ML IJ SOLN: 2.0000 mg | INTRAMUSCULAR | Status: DC | PRN

## 2017-02-15 MED ORDER — SODIUM CHLORIDE 0.9 % IV SOLN
INTRAVENOUS | Status: DC
  Administered 2017-02-15: 16:00:00 via INTRAVENOUS

## 2017-02-15 MED ORDER — ROCURONIUM BROMIDE 10 MG/ML (PF) SYRINGE
PREFILLED_SYRINGE | INTRAVENOUS | Status: AC
  Filled 2017-02-15: qty 5

## 2017-02-15 MED ORDER — NITROGLYCERIN IN D5W 200-5 MCG/ML-% IV SOLN: 0.0000 ug/min | INTRAVENOUS | Status: DC

## 2017-02-15 MED ORDER — SODIUM CHLORIDE 0.9 % IV SOLN: 250.0000 mL | INTRAVENOUS | Status: DC

## 2017-02-15 MED ORDER — MIDAZOLAM HCL 5 MG/5ML IJ SOLN
INTRAMUSCULAR | Status: DC | PRN
  Administered 2017-02-15: 2 mg via INTRAVENOUS
  Administered 2017-02-15: 3 mg via INTRAVENOUS
  Administered 2017-02-15 (×3): 5 mg via INTRAVENOUS

## 2017-02-15 MED ORDER — ARTIFICIAL TEARS OPHTHALMIC OINT
TOPICAL_OINTMENT | OPHTHALMIC | Status: DC | PRN
  Administered 2017-02-15: 1 via OPHTHALMIC

## 2017-02-15 MED ORDER — MIDAZOLAM HCL 10 MG/2ML IJ SOLN
INTRAMUSCULAR | Status: AC
  Filled 2017-02-15: qty 2

## 2017-02-15 MED ORDER — PROTAMINE SULFATE 10 MG/ML IV SOLN
INTRAVENOUS | Status: AC
  Filled 2017-02-15: qty 25

## 2017-02-15 MED ORDER — DEXTRAN 40 IN SALINE 10-0.9 % IV SOLN
INTRAVENOUS | Status: AC
  Filled 2017-02-15: qty 500

## 2017-02-15 MED ORDER — ROCURONIUM BROMIDE 100 MG/10ML IV SOLN
INTRAVENOUS | Status: DC | PRN
  Administered 2017-02-15: 80 mg via INTRAVENOUS
  Administered 2017-02-15: 50 mg via INTRAVENOUS
  Administered 2017-02-15: 20 mg via INTRAVENOUS
  Administered 2017-02-15 (×3): 50 mg via INTRAVENOUS

## 2017-02-15 MED ORDER — ACETAMINOPHEN 160 MG/5ML PO SOLN: 650.0000 mg | Freq: Once | ORAL | Status: AC

## 2017-02-15 MED ORDER — SODIUM CHLORIDE 0.9 % IV SOLN
INTRAVENOUS | Status: DC
  Administered 2017-02-15: 17:00:00 via INTRAVENOUS
  Filled 2017-02-15 (×2): qty 1

## 2017-02-15 MED ORDER — PHENYLEPHRINE HCL 10 MG/ML IJ SOLN
0.0000 ug/min | INTRAMUSCULAR | Status: DC
  Filled 2017-02-15: qty 2

## 2017-02-15 MED ORDER — ACETAMINOPHEN 650 MG RE SUPP
650.0000 mg | Freq: Once | RECTAL | Status: AC
  Administered 2017-02-15: 650 mg via RECTAL

## 2017-02-15 MED ORDER — PHENYLEPHRINE HCL 10 MG/ML IJ SOLN
INTRAMUSCULAR | Status: DC | PRN
  Administered 2017-02-15: 80 ug via INTRAVENOUS
  Administered 2017-02-15: 40 ug via INTRAVENOUS
  Administered 2017-02-15: 80 ug via INTRAVENOUS

## 2017-02-15 MED ORDER — TRAMADOL HCL 50 MG PO TABS
50.0000 mg | ORAL_TABLET | Freq: Four times a day (QID) | ORAL | Status: DC | PRN
  Administered 2017-02-16 – 2017-02-17 (×2): 100 mg via ORAL
  Filled 2017-02-15 (×2): qty 2

## 2017-02-15 MED ORDER — LACTATED RINGERS IV SOLN
INTRAVENOUS | Status: DC | PRN
  Administered 2017-02-15 (×2): via INTRAVENOUS

## 2017-02-15 MED ORDER — SODIUM CHLORIDE 0.9 % IV SOLN
INTRAVENOUS | Status: DC | PRN
  Administered 2017-02-15: 100 mL

## 2017-02-15 MED ORDER — SODIUM CHLORIDE 0.9% FLUSH
3.0000 mL | Freq: Two times a day (BID) | INTRAVENOUS | Status: DC
  Administered 2017-02-16 – 2017-02-17 (×4): 3 mL via INTRAVENOUS

## 2017-02-15 MED ORDER — 0.9 % SODIUM CHLORIDE (POUR BTL) OPTIME
TOPICAL | Status: DC | PRN
  Administered 2017-02-15: 2000 mL

## 2017-02-15 MED ORDER — ONDANSETRON HCL 4 MG/2ML IJ SOLN
4.0000 mg | Freq: Four times a day (QID) | INTRAMUSCULAR | Status: DC | PRN
  Administered 2017-02-15 – 2017-02-17 (×3): 4 mg via INTRAVENOUS
  Filled 2017-02-15 (×4): qty 2

## 2017-02-15 MED ORDER — LACTATED RINGERS IV SOLN
INTRAVENOUS | Status: DC | PRN
  Administered 2017-02-15: 07:00:00 via INTRAVENOUS

## 2017-02-15 MED ORDER — LACTATED RINGERS IV SOLN
INTRAVENOUS | Status: DC
  Administered 2017-02-15: 16:00:00 via INTRAVENOUS

## 2017-02-15 MED ORDER — DEXTRAN 40 IN SALINE 10-0.9 % IV SOLN
INTRAVENOUS | Status: DC | PRN
  Administered 2017-02-15: 50 mL

## 2017-02-15 MED ORDER — PANTOPRAZOLE SODIUM 40 MG PO TBEC
40.0000 mg | DELAYED_RELEASE_TABLET | Freq: Every day | ORAL | Status: DC
  Administered 2017-02-17 – 2017-02-20 (×4): 40 mg via ORAL
  Filled 2017-02-15 (×4): qty 1

## 2017-02-15 MED ORDER — LIDOCAINE HCL (CARDIAC) 20 MG/ML IV SOLN
INTRAVENOUS | Status: DC | PRN
  Administered 2017-02-15: 50 mg via INTRAVENOUS

## 2017-02-15 MED ORDER — PHENYLEPHRINE HCL 10 MG/ML IJ SOLN
INTRAMUSCULAR | Status: DC | PRN
  Administered 2017-02-15: 20 ug/min via INTRAVENOUS

## 2017-02-15 MED ORDER — SODIUM CHLORIDE 0.45 % IV SOLN: INTRAVENOUS | Status: DC | PRN

## 2017-02-15 MED ORDER — MORPHINE SULFATE (PF) 4 MG/ML IV SOLN
1.0000 mg | INTRAVENOUS | Status: AC | PRN
  Administered 2017-02-15: 4 mg via INTRAVENOUS
  Filled 2017-02-15: qty 1

## 2017-02-15 MED ORDER — SODIUM CHLORIDE 0.9 % IV SOLN
INTRAVENOUS | Status: DC | PRN
  Administered 2017-02-15: 2 [IU]/h via INTRAVENOUS

## 2017-02-15 MED ORDER — FAMOTIDINE IN NACL 20-0.9 MG/50ML-% IV SOLN
20.0000 mg | Freq: Two times a day (BID) | INTRAVENOUS | Status: DC
  Administered 2017-02-15: 20 mg via INTRAVENOUS

## 2017-02-15 MED ORDER — ATORVASTATIN CALCIUM 40 MG PO TABS
40.0000 mg | ORAL_TABLET | Freq: Every day | ORAL | Status: DC
  Administered 2017-02-16 – 2017-02-19 (×4): 40 mg via ORAL
  Filled 2017-02-15 (×4): qty 1

## 2017-02-15 MED ORDER — SODIUM CHLORIDE 0.9 % IJ SOLN
OROMUCOSAL | Status: DC | PRN
  Administered 2017-02-15: 8 mL via TOPICAL

## 2017-02-15 MED ORDER — LACTATED RINGERS IV SOLN: 500.0000 mL | Freq: Once | INTRAVENOUS | Status: DC | PRN

## 2017-02-15 MED ORDER — FENTANYL CITRATE (PF) 250 MCG/5ML IJ SOLN
INTRAMUSCULAR | Status: AC
  Filled 2017-02-15: qty 5

## 2017-02-15 MED ORDER — MORPHINE SULFATE (PF) 4 MG/ML IV SOLN
2.0000 mg | INTRAVENOUS | Status: DC | PRN
Start: 2017-02-15 — End: 2017-02-17
  Administered 2017-02-15 – 2017-02-17 (×9): 4 mg via INTRAVENOUS
  Filled 2017-02-15 (×9): qty 1

## 2017-02-15 MED ORDER — LIDOCAINE HCL (PF) 1 % IJ SOLN
INTRAMUSCULAR | Status: AC
  Filled 2017-02-15: qty 30

## 2017-02-15 MED ORDER — BISACODYL 10 MG RE SUPP: 10.0000 mg | Freq: Every day | RECTAL | Status: DC

## 2017-02-15 MED ORDER — METOCLOPRAMIDE HCL 5 MG/ML IJ SOLN
10.0000 mg | Freq: Four times a day (QID) | INTRAMUSCULAR | Status: DC
  Administered 2017-02-15 – 2017-02-18 (×10): 10 mg via INTRAVENOUS
  Filled 2017-02-15 (×10): qty 2

## 2017-02-15 MED ORDER — HEPARIN SODIUM (PORCINE) 1000 UNIT/ML IJ SOLN
INTRAMUSCULAR | Status: AC
  Filled 2017-02-15: qty 1

## 2017-02-15 MED ORDER — CHLORHEXIDINE GLUCONATE 0.12% ORAL RINSE (MEDLINE KIT)
15.0000 mL | Freq: Two times a day (BID) | OROMUCOSAL | Status: DC
  Administered 2017-02-16 – 2017-02-18 (×5): 15 mL via OROMUCOSAL

## 2017-02-15 MED ORDER — VANCOMYCIN HCL IN DEXTROSE 1-5 GM/200ML-% IV SOLN
1000.0000 mg | Freq: Once | INTRAVENOUS | Status: AC
  Administered 2017-02-15: 1000 mg via INTRAVENOUS
  Filled 2017-02-15: qty 200

## 2017-02-15 MED ORDER — BISACODYL 5 MG PO TBEC
10.0000 mg | DELAYED_RELEASE_TABLET | Freq: Every day | ORAL | Status: DC
  Administered 2017-02-16 – 2017-02-17 (×2): 10 mg via ORAL
  Filled 2017-02-15 (×3): qty 2

## 2017-02-15 MED ORDER — PROPOFOL 10 MG/ML IV BOLUS
INTRAVENOUS | Status: AC
  Filled 2017-02-15: qty 20

## 2017-02-15 MED ORDER — POTASSIUM CHLORIDE 10 MEQ/50ML IV SOLN: 10.0000 meq | INTRAVENOUS | Status: AC

## 2017-02-15 MED ORDER — OXYCODONE HCL 5 MG PO TABS
5.0000 mg | ORAL_TABLET | ORAL | Status: DC | PRN
  Administered 2017-02-16: 10 mg via ORAL
  Administered 2017-02-16 (×2): 5 mg via ORAL
  Administered 2017-02-17 – 2017-02-18 (×5): 10 mg via ORAL
  Filled 2017-02-15 (×4): qty 2
  Filled 2017-02-15: qty 1
  Filled 2017-02-15 (×2): qty 2
  Filled 2017-02-15: qty 1

## 2017-02-15 MED ORDER — DOCUSATE SODIUM 100 MG PO CAPS
200.0000 mg | ORAL_CAPSULE | Freq: Every day | ORAL | Status: DC
  Administered 2017-02-16 – 2017-02-18 (×3): 200 mg via ORAL
  Filled 2017-02-15 (×4): qty 2

## 2017-02-15 MED ORDER — SODIUM CHLORIDE 0.9 % IJ SOLN
INTRAMUSCULAR | Status: AC
  Filled 2017-02-15: qty 10

## 2017-02-15 MED ORDER — DEXTROSE 5 % IV SOLN
INTRAVENOUS | Status: DC | PRN
  Administered 2017-02-15: 15 ug/min via INTRAVENOUS

## 2017-02-15 MED ORDER — METOPROLOL TARTRATE 5 MG/5ML IV SOLN: 2.5000 mg | INTRAVENOUS | Status: DC | PRN

## 2017-02-15 MED ORDER — CHLORHEXIDINE GLUCONATE 4 % EX LIQD: 30.0000 mL | CUTANEOUS | Status: DC

## 2017-02-15 MED ORDER — ASPIRIN 81 MG PO CHEW
324.0000 mg | CHEWABLE_TABLET | Freq: Every day | ORAL | Status: DC
  Filled 2017-02-15: qty 4

## 2017-02-15 MED ORDER — LEVOFLOXACIN IN D5W 750 MG/150ML IV SOLN
750.0000 mg | INTRAVENOUS | Status: AC
  Administered 2017-02-16: 750 mg via INTRAVENOUS
  Filled 2017-02-15: qty 150

## 2017-02-15 MED ORDER — MORPHINE SULFATE (PF) 2 MG/ML IV SOLN: 2.0000 mg | INTRAVENOUS | Status: DC | PRN

## 2017-02-15 MED ORDER — PROTAMINE SULFATE 10 MG/ML IV SOLN
INTRAVENOUS | Status: DC | PRN
  Administered 2017-02-15: 100 mg via INTRAVENOUS
  Administered 2017-02-15 (×2): 50 mg via INTRAVENOUS
  Administered 2017-02-15: 10 mg via INTRAVENOUS
  Administered 2017-02-15: 40 mg via INTRAVENOUS
  Administered 2017-02-15: 50 mg via INTRAVENOUS
  Administered 2017-02-15: 100 mg via INTRAVENOUS

## 2017-02-15 MED ORDER — CHLORHEXIDINE GLUCONATE 0.12 % MT SOLN
15.0000 mL | OROMUCOSAL | Status: AC
  Administered 2017-02-15: 15 mL via OROMUCOSAL

## 2017-02-15 MED ORDER — ACETAMINOPHEN 160 MG/5ML PO SOLN: 1000.0000 mg | Freq: Four times a day (QID) | ORAL | Status: DC

## 2017-02-15 MED ORDER — PROPOFOL 10 MG/ML IV BOLUS
INTRAVENOUS | Status: DC | PRN
  Administered 2017-02-15: 40 mg via INTRAVENOUS

## 2017-02-15 MED ORDER — SODIUM CHLORIDE 0.9 % IV SOLN
INTRAVENOUS | Status: DC | PRN
  Administered 2017-02-15: 0.2 ug/kg/h via INTRAVENOUS

## 2017-02-15 MED ORDER — ACETAMINOPHEN 500 MG PO TABS
1000.0000 mg | ORAL_TABLET | Freq: Four times a day (QID) | ORAL | Status: DC
  Administered 2017-02-16 – 2017-02-20 (×15): 1000 mg via ORAL
  Filled 2017-02-15 (×15): qty 2

## 2017-02-15 MED ORDER — FENTANYL CITRATE (PF) 250 MCG/5ML IJ SOLN
INTRAMUSCULAR | Status: DC | PRN
  Administered 2017-02-15: 150 ug via INTRAVENOUS
  Administered 2017-02-15: 50 ug via INTRAVENOUS
  Administered 2017-02-15 (×2): 100 ug via INTRAVENOUS
  Administered 2017-02-15: 750 ug via INTRAVENOUS
  Administered 2017-02-15: 100 ug via INTRAVENOUS

## 2017-02-15 MED ORDER — SODIUM BICARBONATE 8.4 % IV SOLN
50.0000 meq | Freq: Once | INTRAVENOUS | Status: AC
  Administered 2017-02-15: 50 meq via INTRAVENOUS

## 2017-02-15 MED ORDER — METOPROLOL TARTRATE 12.5 MG HALF TABLET: 12.5000 mg | ORAL_TABLET | Freq: Two times a day (BID) | ORAL | Status: DC

## 2017-02-15 MED ORDER — ASPIRIN EC 325 MG PO TBEC
325.0000 mg | DELAYED_RELEASE_TABLET | Freq: Every day | ORAL | Status: DC
  Administered 2017-02-16 – 2017-02-20 (×5): 325 mg via ORAL
  Filled 2017-02-15 (×5): qty 1

## 2017-02-15 MED ORDER — INSULIN REGULAR BOLUS VIA INFUSION
0.0000 [IU] | Freq: Three times a day (TID) | INTRAVENOUS | Status: DC
Start: 2017-02-15 — End: 2017-02-16
  Administered 2017-02-16: 5 [IU] via INTRAVENOUS
  Filled 2017-02-15: qty 10

## 2017-02-15 MED ORDER — SODIUM CHLORIDE 0.9 % IV SOLN
0.0000 ug/kg/h | INTRAVENOUS | Status: DC
  Filled 2017-02-15: qty 2

## 2017-02-15 MED ORDER — MAGNESIUM SULFATE 4 GM/100ML IV SOLN
4.0000 g | Freq: Once | INTRAVENOUS | Status: AC
  Administered 2017-02-15: 4 g via INTRAVENOUS
  Filled 2017-02-15: qty 100

## 2017-02-15 MED ORDER — LIDOCAINE-EPINEPHRINE (PF) 1 %-1:200000 IJ SOLN
INTRAMUSCULAR | Status: DC | PRN
  Administered 2017-02-15: 12 mL via INTRADERMAL

## 2017-02-15 MED ORDER — LIDOCAINE-EPINEPHRINE (PF) 1 %-1:200000 IJ SOLN
INTRAMUSCULAR | Status: AC
  Filled 2017-02-15: qty 30

## 2017-02-15 MED ORDER — ALBUMIN HUMAN 5 % IV SOLN
INTRAVENOUS | Status: DC | PRN
  Administered 2017-02-15: 14:00:00 via INTRAVENOUS

## 2017-02-15 MED FILL — Lidocaine HCl IV Inj 20 MG/ML: INTRAVENOUS | Qty: 5 | Status: AC

## 2017-02-15 MED FILL — Mannitol IV Soln 20%: INTRAVENOUS | Qty: 500 | Status: AC

## 2017-02-15 MED FILL — Heparin Sodium (Porcine) Inj 1000 Unit/ML: INTRAMUSCULAR | Qty: 10 | Status: AC

## 2017-02-15 MED FILL — Electrolyte-R (PH 7.4) Solution: INTRAVENOUS | Qty: 3000 | Status: AC

## 2017-02-15 MED FILL — Sodium Bicarbonate IV Soln 8.4%: INTRAVENOUS | Qty: 100 | Status: AC

## 2017-02-15 MED FILL — Sodium Chloride IV Soln 0.9%: INTRAVENOUS | Qty: 2000 | Status: AC

## 2017-02-15 SURGICAL SUPPLY — 140 items
BAG DECANTER FOR FLEXI CONT (MISCELLANEOUS) ×15 IMPLANT
BANDAGE ACE 4X5 VEL STRL LF (GAUZE/BANDAGES/DRESSINGS) ×5 IMPLANT
BANDAGE ACE 6X5 VEL STRL LF (GAUZE/BANDAGES/DRESSINGS) ×5 IMPLANT
BANDAGE ELASTIC 4 VELCRO ST LF (GAUZE/BANDAGES/DRESSINGS) ×5 IMPLANT
BANDAGE ELASTIC 6 VELCRO ST LF (GAUZE/BANDAGES/DRESSINGS) ×5 IMPLANT
BASKET HEART  (ORDER IN 25'S) (MISCELLANEOUS) ×1
BASKET HEART (ORDER IN 25'S) (MISCELLANEOUS) ×1
BASKET HEART (ORDER IN 25S) (MISCELLANEOUS) ×3 IMPLANT
BLADE STERNUM SYSTEM 6 (BLADE) ×5 IMPLANT
BLADE SURG 11 STRL SS (BLADE) ×5 IMPLANT
BNDG GAUZE ELAST 4 BULKY (GAUZE/BANDAGES/DRESSINGS) ×5 IMPLANT
BOOT SUTURE AID YELLOW STND (SUTURE) ×5 IMPLANT
CABLE PACING FASLOC BIEGE (MISCELLANEOUS) ×5 IMPLANT
CANISTER SUCT 3000ML PPV (MISCELLANEOUS) ×10 IMPLANT
CANNULA EZ GLIDE AORTIC 21FR (CANNULA) ×5 IMPLANT
CANNULA VESSEL 3MM 2 BLNT TIP (CANNULA) ×15 IMPLANT
CATH CPB KIT HENDRICKSON (MISCELLANEOUS) ×5 IMPLANT
CATH ROBINSON RED A/P 18FR (CATHETERS) ×10 IMPLANT
CATH THORACIC 36FR (CATHETERS) ×5 IMPLANT
CATH THORACIC 36FR RT ANG (CATHETERS) ×5 IMPLANT
CLIP TI WIDE RED SMALL 24 (CLIP) ×10 IMPLANT
CLIP VESOCCLUDE MED 24/CT (CLIP) ×5 IMPLANT
CLIP VESOCCLUDE SM WIDE 24/CT (CLIP) ×5 IMPLANT
CRADLE DONUT ADULT HEAD (MISCELLANEOUS) ×5 IMPLANT
DERMABOND ADVANCED (GAUZE/BANDAGES/DRESSINGS) ×2
DERMABOND ADVANCED .7 DNX12 (GAUZE/BANDAGES/DRESSINGS) ×3 IMPLANT
DRAIN CHANNEL 15F RND FF W/TCR (WOUND CARE) IMPLANT
DRAPE CARDIOVASCULAR INCISE (DRAPES) ×2
DRAPE SLUSH/WARMER DISC (DRAPES) ×5 IMPLANT
DRAPE SRG 135X102X78XABS (DRAPES) ×3 IMPLANT
DRSG COVADERM 4X14 (GAUZE/BANDAGES/DRESSINGS) ×5 IMPLANT
DRSG COVADERM 4X6 (GAUZE/BANDAGES/DRESSINGS) ×5 IMPLANT
ELECT CAUTERY BLADE 6.4 (BLADE) ×5 IMPLANT
ELECT REM PT RETURN 9FT ADLT (ELECTROSURGICAL) ×10
ELECTRODE REM PT RTRN 9FT ADLT (ELECTROSURGICAL) ×6 IMPLANT
EVACUATOR SILICONE 100CC (DRAIN) IMPLANT
FELT TEFLON 1X6 (MISCELLANEOUS) ×5 IMPLANT
GAUZE SPONGE 4X4 12PLY STRL (GAUZE/BANDAGES/DRESSINGS) ×10 IMPLANT
GAUZE SPONGE 4X4 12PLY STRL LF (GAUZE/BANDAGES/DRESSINGS) ×15 IMPLANT
GEL ULTRASOUND 20GR AQUASONIC (MISCELLANEOUS) ×5 IMPLANT
GLOVE BIO SURGEON STRL SZ7.5 (GLOVE) ×5 IMPLANT
GLOVE BIOGEL M 7.0 STRL (GLOVE) ×10 IMPLANT
GLOVE BIOGEL M STER SZ 6 (GLOVE) ×10 IMPLANT
GLOVE BIOGEL PI IND STRL 6.5 (GLOVE) ×12 IMPLANT
GLOVE BIOGEL PI IND STRL 7.0 (GLOVE) ×9 IMPLANT
GLOVE BIOGEL PI IND STRL 8 (GLOVE) ×3 IMPLANT
GLOVE BIOGEL PI INDICATOR 6.5 (GLOVE) ×8
GLOVE BIOGEL PI INDICATOR 7.0 (GLOVE) ×6
GLOVE BIOGEL PI INDICATOR 8 (GLOVE) ×2
GLOVE SS BIOGEL STRL SZ 6 (GLOVE) ×6 IMPLANT
GLOVE SUPERSENSE BIOGEL SZ 6 (GLOVE) ×4
GLOVE SURG SIGNA 7.5 PF LTX (GLOVE) ×15 IMPLANT
GOWN STRL REUS W/ TWL LRG LVL3 (GOWN DISPOSABLE) ×21 IMPLANT
GOWN STRL REUS W/ TWL XL LVL3 (GOWN DISPOSABLE) ×6 IMPLANT
GOWN STRL REUS W/TWL LRG LVL3 (GOWN DISPOSABLE) ×14
GOWN STRL REUS W/TWL XL LVL3 (GOWN DISPOSABLE) ×4
HEMOSTAT POWDER SURGIFOAM 1G (HEMOSTASIS) ×15 IMPLANT
HEMOSTAT SURGICEL 2X14 (HEMOSTASIS) ×5 IMPLANT
INSERT FOGARTY XLG (MISCELLANEOUS) IMPLANT
KIT BASIN OR (CUSTOM PROCEDURE TRAY) ×10 IMPLANT
KIT ROOM TURNOVER OR (KITS) ×5 IMPLANT
KIT SHUNT ARGYLE CAROTID ART 6 (VASCULAR PRODUCTS) IMPLANT
KIT SUCTION CATH 14FR (SUCTIONS) ×15 IMPLANT
KIT VASOVIEW HEMOPRO VH 3000 (KITS) ×5 IMPLANT
LOOP VESSEL MINI RED (MISCELLANEOUS) ×5 IMPLANT
MARKER GRAFT CORONARY BYPASS (MISCELLANEOUS) ×15 IMPLANT
NEEDLE 22X1 1/2 (OR ONLY) (NEEDLE) ×5 IMPLANT
NEEDLE HYPO 25GX1X1/2 BEV (NEEDLE) ×5 IMPLANT
NS IRRIG 1000ML POUR BTL (IV SOLUTION) ×35 IMPLANT
PACK CAROTID (CUSTOM PROCEDURE TRAY) IMPLANT
PACK OPEN HEART (CUSTOM PROCEDURE TRAY) ×5 IMPLANT
PAD ARMBOARD 7.5X6 YLW CONV (MISCELLANEOUS) ×20 IMPLANT
PAD ELECT DEFIB RADIOL ZOLL (MISCELLANEOUS) ×5 IMPLANT
PATCH VASC XENOSURE 1CMX6CM (Vascular Products) ×2 IMPLANT
PATCH VASC XENOSURE 1X6 (Vascular Products) ×3 IMPLANT
PENCIL BUTTON HOLSTER BLD 10FT (ELECTRODE) ×5 IMPLANT
PUNCH AORTIC ROTATE 4.0MM (MISCELLANEOUS) IMPLANT
PUNCH AORTIC ROTATE 4.5MM 8IN (MISCELLANEOUS) ×5 IMPLANT
PUNCH AORTIC ROTATE 5MM 8IN (MISCELLANEOUS) IMPLANT
SET CARDIOPLEGIA MPS 5001102 (MISCELLANEOUS) ×5 IMPLANT
SHUNT CAROTID BYPASS 10 (VASCULAR PRODUCTS) IMPLANT
SHUNT CAROTID BYPASS 12 (VASCULAR PRODUCTS) ×5 IMPLANT
SLEEVE SURGEON STRL (DRAPES) ×5 IMPLANT
SOLUTION ANTI FOG 6CC (MISCELLANEOUS) ×5 IMPLANT
SPONGE LAP 18X18 X RAY DECT (DISPOSABLE) ×10 IMPLANT
SPONGE SURGIFOAM ABS GEL 100 (HEMOSTASIS) IMPLANT
STAPLER VISISTAT 35W (STAPLE) ×5 IMPLANT
SUT BONE WAX W31G (SUTURE) ×5 IMPLANT
SUT ETHIBOND 2 0 SH (SUTURE) ×8
SUT ETHIBOND 2 0 SH 36X2 (SUTURE) ×12 IMPLANT
SUT MNCRL AB 3-0 PS2 18 (SUTURE) ×5 IMPLANT
SUT MNCRL AB 4-0 PS2 18 (SUTURE) ×5 IMPLANT
SUT PROLENE 3 0 SH DA (SUTURE) ×5 IMPLANT
SUT PROLENE 4 0 RB 1 (SUTURE) ×4
SUT PROLENE 4 0 SH DA (SUTURE) ×5 IMPLANT
SUT PROLENE 4-0 RB1 .5 CRCL 36 (SUTURE) ×6 IMPLANT
SUT PROLENE 5 0 C 1 36 (SUTURE) ×10 IMPLANT
SUT PROLENE 6 0 BV (SUTURE) ×10 IMPLANT
SUT PROLENE 6 0 C 1 30 (SUTURE) ×15 IMPLANT
SUT PROLENE 7 0 BV 1 (SUTURE) ×5 IMPLANT
SUT PROLENE 7 0 BV1 MDA (SUTURE) ×5 IMPLANT
SUT PROLENE 8 0 BV175 6 (SUTURE) ×10 IMPLANT
SUT SILK  1 MH (SUTURE) ×6
SUT SILK 1 MH (SUTURE) ×9 IMPLANT
SUT SILK 1 TIES 10X30 (SUTURE) ×5 IMPLANT
SUT SILK 2 0 FS (SUTURE) IMPLANT
SUT SILK 2 0 SH CR/8 (SUTURE) ×10 IMPLANT
SUT SILK 2 0 TIES 10X30 (SUTURE) ×10 IMPLANT
SUT SILK 2 0 TIES 17X18 (SUTURE) ×2
SUT SILK 2-0 18XBRD TIE BLK (SUTURE) ×3 IMPLANT
SUT SILK 3 0 SH CR/8 (SUTURE) ×5 IMPLANT
SUT SILK 4 0 TIE 10X30 (SUTURE) ×10 IMPLANT
SUT STEEL 6MS V (SUTURE) ×5 IMPLANT
SUT STEEL STERNAL CCS#1 18IN (SUTURE) IMPLANT
SUT STEEL SZ 6 DBL 3X14 BALL (SUTURE) ×10 IMPLANT
SUT TEM PAC WIRE 2 0 SH (SUTURE) ×20 IMPLANT
SUT VIC AB 1 CTX 36 (SUTURE) ×4
SUT VIC AB 1 CTX36XBRD ANBCTR (SUTURE) ×6 IMPLANT
SUT VIC AB 2-0 CT1 27 (SUTURE) ×2
SUT VIC AB 2-0 CT1 TAPERPNT 27 (SUTURE) ×3 IMPLANT
SUT VIC AB 2-0 CTX 27 (SUTURE) ×20 IMPLANT
SUT VIC AB 3-0 SH 27 (SUTURE) ×4
SUT VIC AB 3-0 SH 27X BRD (SUTURE) ×6 IMPLANT
SUT VIC AB 3-0 X1 27 (SUTURE) ×15 IMPLANT
SUT VICRYL 4-0 PS2 18IN ABS (SUTURE) IMPLANT
SUTURE E-PAK OPEN HEART (SUTURE) ×5 IMPLANT
SYR 20CC LL (SYRINGE) ×5 IMPLANT
SYR CONTROL 10ML LL (SYRINGE) ×5 IMPLANT
SYSTEM SAHARA CHEST DRAIN ATS (WOUND CARE) ×5 IMPLANT
TAPE CLOTH SURG 4X10 WHT LF (GAUZE/BANDAGES/DRESSINGS) ×5 IMPLANT
TAPE PAPER 2X10 WHT MICROPORE (GAUZE/BANDAGES/DRESSINGS) ×5 IMPLANT
TOWEL GREEN STERILE (TOWEL DISPOSABLE) ×20 IMPLANT
TOWEL GREEN STERILE FF (TOWEL DISPOSABLE) ×10 IMPLANT
TOWEL OR 17X24 6PK STRL BLUE (TOWEL DISPOSABLE) ×10 IMPLANT
TOWEL OR 17X26 10 PK STRL BLUE (TOWEL DISPOSABLE) ×10 IMPLANT
TRAY FOLEY SILVER 16FR TEMP (SET/KITS/TRAYS/PACK) ×5 IMPLANT
TUBE FEEDING 8FR 16IN STR KANG (MISCELLANEOUS) ×5 IMPLANT
TUBING INSUFFLATION (TUBING) ×5 IMPLANT
UNDERPAD 30X30 (UNDERPADS AND DIAPERS) ×5 IMPLANT
WATER STERILE IRR 1000ML POUR (IV SOLUTION) ×10 IMPLANT

## 2017-02-15 NOTE — Anesthesia Procedure Notes (Signed)
Procedure Name: Intubation Date/Time: 02/15/2017 7:49 AM Performed by: Trixie Deis A Pre-anesthesia Checklist: Patient identified, Emergency Drugs available, Suction available and Patient being monitored Patient Re-evaluated:Patient Re-evaluated prior to induction Oxygen Delivery Method: Circle System Utilized Preoxygenation: Pre-oxygenation with 100% oxygen Induction Type: IV induction Ventilation: Oral airway inserted - appropriate to patient size, Mask ventilation with difficulty and Two handed mask ventilation required Laryngoscope Size: Mac and 4 Grade View: Grade II Tube type: Oral Tube size: 8.0 mm Number of attempts: 1 Airway Equipment and Method: Stylet and Oral airway Placement Confirmation: ETT inserted through vocal cords under direct vision,  positive ETCO2 and breath sounds checked- equal and bilateral Secured at: 23 cm Tube secured with: Tape Dental Injury: Teeth and Oropharynx as per pre-operative assessment

## 2017-02-15 NOTE — Interval H&P Note (Signed)
History and Physical Interval Note:  02/15/2017 7:24 AM  Paul Mcneil  has presented today for surgery, with the diagnosis of CAD I65.22  The various methods of treatment have been discussed with the patient and family. After consideration of risks, benefits and other options for treatment, the patient has consented to  Procedure(s): CORONARY ARTERY BYPASS GRAFTING (CABG) (N/A) TRANSESOPHAGEAL ECHOCARDIOGRAM (TEE) (N/A) LEFT ENDARTERECTOMY CAROTID (Left) as a surgical intervention .  The patient's history has been reviewed, patient examined, no change in status, stable for surgery.  I have reviewed the patient's chart and labs.  Questions were answered to the patient's satisfaction.     [name]

## 2017-02-15 NOTE — Procedures (Signed)
Extubation Procedure Note  Patient Details:    Name: Paul Mcneil DOB: 1946/03/28 MRN: [id number]   Airway Documentation:     Evaluation  O2 sats: stable throughout Complications: No apparent complications Patient did tolerate procedure well. Bilateral Breath Sounds: Clear, Diminished   Yes   Patient extubated per RT protocol to 2L [place]. NIF -20 and VC 900. Positive cuff leak noted. Patient able to speak and vital signs stable. Sats currently 99%. IS performed x5 with achieved goal of 250. No complications noted. RT will continue to monitor.   Abdiel Blackerby Clyda Greener 02/15/2017, 6:26 PM

## 2017-02-15 NOTE — H&P (View-Only) (Signed)
Reason for Consult: Left main/ 3 vessel CAD Referring Physician: Dr. Stacey Drain  Paul Mcneil is an 71 y.o. male.  HPI: 71 yo man with a past history of anemia, arthritis, GERD, COPD (GOLD II), 100 pack year history of smoking (quit 4 months ago), and a history of ethanol use. He is a retired Engineer, structural. He has no prior history of CAD. He has noticed increasing shortness of breath with exertion over the past few weeks. He says that when he walks his legs feel like lead in the hips and he feels SOB. Symptoms resolve with rest. No rest or nocturnal symptoms.  He saw Dr. Dorthy Cooler who did a w/u including an EKG which showed inferior Q waves. A CT which showed a LUL nodule. A stress myoview showed a partially reversible basal to apical inferior defect suggestive of infarct with peri-infarct ischemia. Today he had cardiac catheterization which revealed moderate left main disease with severe disease in the RCA and the circumflex.  He is currently pain free.  Past Medical History:  Diagnosis Date  . Anemia 1964  . Arthritis    "touch starting to show up in the joints" (12/11/2013)  . COPD (chronic obstructive pulmonary disease) ([place])    "a little bit" (12/11/2013)  . Former heavy tobacco smoker   . GERD (gastroesophageal reflux disease)    Dr. Michail Sermon  . History of alcohol abuse     Past Surgical History:  Procedure Laterality Date  . APPENDECTOMY  12/11/2013   "laparoscopic"  . CHOLECYSTECTOMY  ~ 2010  . LAPAROSCOPIC APPENDECTOMY N/A 12/11/2013   Procedure: APPENDECTOMY LAPAROSCOPIC;  Surgeon: Imogene Burn. Georgette Dover, MD;  Location: [place];  Service: General;  Laterality: N/A;  . RIGHT/LEFT HEART CATH AND CORONARY ANGIOGRAPHY N/A 01/30/2017   Procedure: Right/Left Heart Cath and Coronary Angiography;  Surgeon: Troy Sine, MD;  Location: [facility];  Service: Cardiovascular;  Laterality: N/A;  . TONSILLECTOMY AND ADENOIDECTOMY  1950's    Family History  Problem Relation Age of  Onset  . Rheum arthritis Mother   . Cancer Father        BONE MARROW    Social History:  reports that he quit smoking about 4 months ago. His smoking use included Cigarettes. He has a 104.00 pack-year smoking history. He has never used smokeless tobacco. He reports that he drinks about 12.0 oz of alcohol per week . He reports that he uses drugs.  Allergies:  Allergies  Allergen Reactions  . Ceclor [Cefaclor] Hives and Other (See Comments)    Dropped blood pressure    Medications:  Prior to Admission:  Prescriptions Prior to Admission  Medication Sig Dispense Refill Last Dose  . aspirin EC 81 MG tablet Take 81 mg by mouth at bedtime.    01/30/2017 at 0630  . atorvastatin (LIPITOR) 10 MG tablet Take 10 mg by mouth at bedtime.    01/29/2017 at Unknown time  . esomeprazole (NEXIUM) 40 MG capsule Take 40 mg by mouth daily as needed (for acid reflux).   Past Week at Unknown time  . Garlic (GARLIQUE) 542 MG TBEC Take 400 mg by mouth every other day.   01/29/2017 at Unknown time  . Lactobacillus Casei-Folic Acid (RESTORA RX) 60-1.25 MG CAPS Take 1 tablet by mouth as needed.   Past Week at Unknown time  . Omega-3 Fatty Acids (OMEGA-3 FISH OIL PO) Take 360 mg by mouth every other day.   Past Week at Unknown time    Results  for orders placed or performed during the hospital encounter of 01/30/17 (from the past 48 hour(s))  Glucose, capillary     Status: Abnormal   Collection Time: 01/30/17  7:48 AM  Result Value Ref Range   Glucose-Capillary 266 (H) 65 - 99 mg/dL  I-STAT 3, venous blood gas (G3P V)     Status: Abnormal   Collection Time: 01/30/17  3:27 PM  Result Value Ref Range   pH, Ven 7.395 7.250 - 7.430   pCO2, Ven 42.9 (L) 44.0 - 60.0 mmHg   pO2, Ven 35.0 32.0 - 45.0 mmHg   Bicarbonate 26.3 20.0 - 28.0 mmol/L   TCO2 28 0 - 100 mmol/L   O2 Saturation 68.0 %   Acid-Base Excess 1.0 0.0 - 2.0 mmol/L   Patient temperature HIDE    Sample type VENOUS    Comment NOTIFIED PHYSICIAN    I-STAT 3, arterial blood gas (G3+)     Status: None   Collection Time: 01/30/17  3:32 PM  Result Value Ref Range   pH, Arterial 7.396 7.350 - 7.450   pCO2 arterial 37.0 32.0 - 48.0 mmHg   pO2, Arterial 105.0 83.0 - 108.0 mmHg   Bicarbonate 22.7 20.0 - 28.0 mmol/L   TCO2 24 0 - 100 mmol/L   O2 Saturation 98.0 %   Acid-base deficit 2.0 0.0 - 2.0 mmol/L   Patient temperature HIDE    Sample type ARTERIAL   Glucose, capillary     Status: Abnormal   Collection Time: 01/30/17  4:49 PM  Result Value Ref Range   Glucose-Capillary 218 (H) 65 - 99 mg/dL  Glucose, capillary     Status: Abnormal   Collection Time: 01/30/17  8:27 PM  Result Value Ref Range   Glucose-Capillary 242 (H) 65 - 99 mg/dL  CBC     Status: Abnormal   Collection Time: 01/31/17  4:41 AM  Result Value Ref Range   WBC 5.2 4.0 - 10.5 K/uL   RBC 4.00 (L) 4.22 - 5.81 MIL/uL   Hemoglobin 11.4 (L) 13.0 - 17.0 g/dL   HCT 34.0 (L) 39.0 - 52.0 %   MCV 85.0 78.0 - 100.0 fL   MCH 28.5 26.0 - 34.0 pg   MCHC 33.5 30.0 - 36.0 g/dL   RDW 12.7 11.5 - 15.5 %   Platelets 215 150 - 400 K/uL  Heparin level (unfractionated)     Status: Abnormal   Collection Time: 01/31/17 10:59 AM  Result Value Ref Range   Heparin Unfractionated 0.26 (L) 0.30 - 0.70 IU/mL    Comment:        IF HEPARIN RESULTS ARE BELOW EXPECTED VALUES, AND PATIENT DOSAGE HAS BEEN CONFIRMED, SUGGEST FOLLOW UP TESTING OF ANTITHROMBIN III LEVELS.     No results found.  Review of Systems  Constitutional: Positive for malaise/fatigue. Negative for chills and fever.  Eyes: Negative for blurred vision and double vision.  Respiratory: Positive for cough and shortness of breath.   Cardiovascular: Positive for chest pain and claudication (buttocks and hips).  Gastrointestinal: Positive for heartburn. Negative for nausea and vomiting.  Genitourinary: Negative for dysuria and urgency.  Neurological: Negative for focal weakness and loss of consciousness.  All other  systems reviewed and are negative.  Blood pressure (!) 152/76, pulse 74, temperature 98 F (36.7 C), temperature source Oral, resp. rate 18, height 6\' 1"  (1.854 m), weight 245 lb 1.6 oz (111.2 kg), SpO2 98 %. Physical Exam  Vitals reviewed. Constitutional: He is oriented to person, place,  and time. He appears well-developed and well-nourished. No distress.  HENT:  Head: Normocephalic and atraumatic.  Mouth/Throat: No oropharyngeal exudate.  Eyes: Conjunctivae and EOM are normal. No scleral icterus.  Neck: Neck supple. No thyromegaly present.  Bilateral carotid bruits R>L  Cardiovascular: Normal rate and regular rhythm.  Exam reveals no gallop and no friction rub.   No murmur heard. Respiratory: Effort normal and breath sounds normal. No respiratory distress. He has no wheezes. He has no rales.  GI: Soft. He exhibits no distension. There is no tenderness.  Musculoskeletal: He exhibits no edema or deformity.  Lymphadenopathy:    He has no cervical adenopathy.  Neurological: He is alert and oriented to person, place, and time. No cranial nerve deficit. He exhibits normal muscle tone.  Skin: Skin is warm and dry.   ECHOCARDIOGRAM Study Conclusions  - Left ventricle: Mild aneurysmal dilitation of inferior base. The   cavity size was normal. Wall thickness was increased in a pattern   of mild LVH. Systolic function was normal. The estimated ejection   fraction was in the range of 60% to 65%. Doppler parameters are   consistent with abnormal left ventricular relaxation (grade 1   diastolic dysfunction). - Mitral valve: There was mild regurgitation.  CARDIAC CATHETERIZATION Conclusion     Ost RCA lesion, 80 %stenosed.  Mid RCA-2 lesion, 100 %stenosed.  Mid RCA-1 lesion, 90 %stenosed.  Ost LM lesion, 60 %stenosed.  Ost Ramus lesion, 70 %stenosed.  Ramus lesion, 80 %stenosed.  Prox Cx lesion, 90 %stenosed.  Mid Cx lesion, 80 %stenosed.  3rd Mrg lesion, 70  %stenosed.  Prox LAD to Mid LAD lesion, 40 %stenosed.  Mid LAD lesion, 40 %stenosed.   Normal right heart pressures.  Significant multivessel CAD with  50-60% smooth distal left main stenosis with the percent proximal mid LAD stenoses; 70% ostial and 80% proximal ramus intermediate stenoses; 90% eccentric proximal circumflex stenoses with 80 and 70% distal stenoses and 80% proximal, 90% proximal to mid and total occlusion of the mid RCA with left-to-right collaterals.  LVEDP 19 mmHg.  Left ventriculography was not done due to the patient's renal insufficiency.  RECOMMENDATION: The patient will be hydrated overnight.  He will be started on IV heparin.  Surgical consultation will be obtained for CABG revascularization surgery.  Angiographic findings were reviewed with Dr. Glenetta Hew.    I personally reviewed the cath images and concur with the findings noted above  Assessment/Plan: Mr. Dowding is a 71 year old man with multiple CRf including heavy tobacco abuse, hypertension, and hyperlipidemia. He also has recently been found to have hyperglycemia and is likely an undiagnosed type II diabetic. He presents with SOB with exertion and was found to have left main and 3 vessel CAD. EF 60% by echo. CABG is indicated for survival benefit and relief of symptoms.   In addition to newly diagnosed diabetes he also has been found to have renal insufficiency with a creatinine of 1.8. It is unknown if this is acute or chronic.  I discussed the general nature of CABG, the need for general anesthesia, the use of cardiopulmonary bypass, and the incisions to be used with Mr. Lessley. We discussed the expected hospital stay, overall recovery and short and long term outcomes. I informed him of the indications, risks, benefits and alternatives. He understands the risks include, but are not limited to death, stroke, MI, DVT/PE, bleeding, possible need for transfusion, infections, cardiac arrhythmias, as well as  other organ system dysfunction including  respiratory, renal, or GI complications. He understands PAD is a marker for increased surgical risk. Also increased risk with renal issues, new diabetes and smoking history.  He accepts the risks and wishes to proceed. I would be unable to do his procedure until Friday 7/27. One of my partners may be able to do one day next week. Our scheduling nurse will set up a date. I think he would probably be safe to be discharged after he is seen by Vascular and come back for surgery next week.  Left main and 3 vessel CAD- CABG indicated for survival benefit and relief of symptoms.  Carotid stenosis- bilateral. Left is 80-99% and right is 60-79%- Vascular surgery consult.  PAD with hip claudication symptoms- ABI Left 0.83, right 0.92- Vascular consult  Lung nodule left upper lobe- appearance suggestive of infectious/ inflammatory nodule or scar, but given smoking history needs close follow up.  Melrose Nakayama 01/31/2017, 5:51 PM

## 2017-02-15 NOTE — H&P (View-Only) (Signed)
[facility] NOTE   MRN : [id number]  Reason for Consult: Carotid stenosis Referring Physician: Dr. Roxan Hockey  History of Present Illness: 71 y/o male He was admitted for cardiac work up due to increasing shortness of breath with exertion over the past few weeks.  He saw Dr. Dorthy Mcneil who did a w/u including an EKG which showed inferior Q waves. A CT which showed a LUL nodule. A stress myoview showed a partially reversible basal to apical inferior defect suggestive of infarct with peri-infarct ischemia. Today he had cardiac catheterization which revealed moderate left main disease with severe disease in the RCA and the circumflex.  Pre operative planning by Dr. Roxan Hockey discovered B carotid stenosis.  Right 60-79% and left 80-99%.     Past history of anemia, arthritis, GERD, COPD (GOLD II), 100 pack year history of smoking (quit 4 months ago), and a history of ethanol use. He is a retired Engineer, structural. He has no prior history of CAD.   Current Facility-Administered Medications  Medication Dose Route Frequency Provider Last Rate Last Dose  . aspirin EC tablet 81 mg  81 mg Oral QHS Minus Breeding, MD   81 mg at 01/31/17 2101  . atorvastatin (LIPITOR) tablet 40 mg  40 mg Oral QHS Paul Man, MD   40 mg at 01/31/17 2101  . bisoprolol (ZEBETA) tablet 5 mg  5 mg Oral Daily Paul Man, MD   5 mg at 01/31/17 1614  . heparin ADULT infusion 100 units/mL (25000 units/27mL sodium chloride 0.45%)  1,550 Units/hr Intravenous Continuous Skeet Simmer, RPH 15.5 mL/hr at 02/01/17 0754 1,550 Units/hr at 02/01/17 0754  . isosorbide mononitrate (IMDUR) 24 hr tablet 30 mg  30 mg Oral Daily Paul Man, MD   30 mg at 01/31/17 1613  . ondansetron (ZOFRAN) injection 4 mg  4 mg Intravenous Q6H PRN Minus Breeding, MD      . pantoprazole (PROTONIX) EC tablet 40 mg  40 mg Oral Daily Minus Breeding, MD   40 mg at 01/31/17 0931  . pneumococcal 23 valent  vaccine (PNU-IMMUNE) injection 0.5 mL  0.5 mL Intramuscular Tomorrow-1000 Troy Sine, MD        Pt meds include: Statin :Yes Betablocker: No ASA: Yes Other anticoagulants/antiplatelets: none  Past Medical History:  Diagnosis Date  . Anemia 1964  . Arthritis    "touch starting to show up in the joints" (12/11/2013)  . COPD (chronic obstructive pulmonary disease) ([place])    "a little bit" (12/11/2013)  . Former heavy tobacco smoker   . GERD (gastroesophageal reflux disease)    Dr. Michail Sermon  . History of alcohol abuse     Past Surgical History:  Procedure Laterality Date  . APPENDECTOMY  12/11/2013   "laparoscopic"  . CHOLECYSTECTOMY  ~ 2010  . LAPAROSCOPIC APPENDECTOMY N/A 12/11/2013   Procedure: APPENDECTOMY LAPAROSCOPIC;  Surgeon: Imogene Burn. Georgette Dover, MD;  Location: [facility];  Service: General;  Laterality: N/A;  . RIGHT/LEFT HEART CATH AND CORONARY ANGIOGRAPHY N/A 01/30/2017   Procedure: Right/Left Heart Cath and Coronary Angiography;  Surgeon: Troy Sine, MD;  Location: [place];  Service: Cardiovascular;  Laterality: N/A;  . TONSILLECTOMY AND ADENOIDECTOMY  1950's    Social History Social History  Substance Use Topics  . Smoking status: Former Smoker    Packs/day: 2.00    Years: 52.00    Types: Cigarettes    Quit date: 09/21/2016  . Smokeless tobacco: Never Used  .  Alcohol use 12.0 oz/week    20 Glasses of wine per week    Family History Family History  Problem Relation Age of Onset  . Rheum arthritis Mother   . Cancer Father        BONE MARROW    Allergies  Allergen Reactions  . Ceclor [Cefaclor] Hives and Other (See Comments)    Dropped blood pressure     REVIEW OF SYSTEMS  General: [ ]  Weight loss, [ ]  Fever, [ ]  chills Neurologic: [ ]  Dizziness, [ ]  Blackouts, [ ]  Seizure [ ]  Stroke, [ ]  "Mini stroke", [ ]  Slurred speech, [ ]  Temporary blindness; [ ]  weakness in arms or legs, [ ]  Hoarseness [ ]  Dysphagia Cardiac: [ ]  Chest pain/pressure,  [ ]  Shortness of breath at rest [x ] Shortness of breath with exertion, [ ]  Atrial fibrillation or irregular heartbeat  Vascular: [ ]  Pain in legs with walking, [ ]  Pain in legs at rest, [ ]  Pain in legs at night,  [ ]  Non-healing ulcer, [ ]  Blood clot in vein/DVT,   Pulmonary: [ ]  Home oxygen, [ ]  Productive cough, [ ]  Coughing up blood, [ ]  Asthma,  [ ]  Wheezing [x ] COPD Musculoskeletal:  [x ] Arthritis, [ ]  Low back pain, [ ]  Joint pain Hematologic: [ ]  Easy Bruising, [x ] Anemia; [ ]  Hepatitis Gastrointestinal: [ ]  Blood in stool, [ ]  Gastroesophageal Reflux/heartburn, Urinary: [ ]  chronic Kidney disease, [ ]  on HD - [ ]  MWF or [ ]  TTHS, [ ]  Burning with urination, [ ]  Difficulty urinating Skin: [ ]  Rashes, [ ]  Wounds Psychological: [ ]  Anxiety, [ ]  Depression  Physical Examination Vitals:   01/31/17 1223 01/31/17 1709 01/31/17 1952 02/01/17 0441  BP: (!) 146/76 (!) 152/76 127/68 112/65  Pulse: 75 74 61 62  Resp: 18 18 18 18   Temp: 98.9 F (37.2 C) 98 F (36.7 C) 98.6 F (37 C) 97.8 F (36.6 C)  TempSrc: Oral Oral Oral Oral  SpO2: 97% 98% 94% 96%  Weight:    242 lb 8 oz (110 kg)  Height:       Body mass index is 31.99 kg/m.  General:  WDWN in NAD Gait: Normal HENT: WNL Eyes: Pupils equal Pulmonary: normal non-labored breathing , without Rales, rhonchi,  wheezing Cardiac: RRR, without  Murmurs, rubs or gallops; Positve carotid bruits Abdomen: soft, NT, no masses Skin: no rashes, ulcers noted;  no Gangrene , no cellulitis; no open wounds;   Vascular Exam/Pulses:palable radial, femoral,DP pulses B   Musculoskeletal: no muscle wasting or atrophy; no edema  Neurologic: A&O X 3; Appropriate Affect ;  SENSATION: normal; MOTOR FUNCTION: 5/5 Symmetric Speech is fluent/normal   Significant Diagnostic Studies: CBC Lab Results  Component Value Date   WBC 6.0 02/01/2017   HGB 11.0 (L) 02/01/2017   HCT 32.8 (L) 02/01/2017   MCV 84.5 02/01/2017   PLT 212 02/01/2017     BMET    Component Value Date/Time   NA 137 02/01/2017 0348   NA 137 01/24/2017 1535   K 4.2 02/01/2017 0348   CL 106 02/01/2017 0348   CO2 21 (L) 02/01/2017 0348   GLUCOSE 201 (H) 02/01/2017 0348   BUN 15 02/01/2017 0348   BUN 22 01/24/2017 1535   CREATININE 2.09 (H) 02/01/2017 0348   CALCIUM 8.8 (L) 02/01/2017 0348   GFRNONAA 30 (L) 02/01/2017 0348   GFRAA 35 (L) 02/01/2017 0348   Estimated Creatinine Clearance: 42.7 mL/min (  A) (by C-G formula based on SCr of 2.09 mg/dL (H)).  COAG Lab Results  Component Value Date   INR 0.9 01/24/2017     Non-Invasive Vascular Imaging:  Carotid duplex Right 60-79% and left 80-99%  ASSESSMENT/PLAN:  CAD with plan for CABG  Left > right carotid stenosis asymptomatic.   We will discuss a plan for left CEA in the near future.  If we can coordinate with his CABG.     Laurence Slate [facility] 02/01/2017 9:55 AM   I have independently interviewed patient and agree with PA assessment and plan above. Very tight left carotid stenosis with nearly 80% right. Will plan carotid with concomitant cabg on 7.27 to be performed by Dr. Scot Dock. Discussed risks of stroke, cranial nerve injury, hematoma with benefits of preventing future stroke. He agrees to proceed and will get him scheduled.   Bronislaw Switzer C. Donzetta Matters, MD [facility] [place] Office: [phone] Pager: [phone]

## 2017-02-15 NOTE — Op Note (Addendum)
    NAME: Paul Mcneil    MRN: [id number] DOB: 1946/07/04    DATE OF OPERATION: 02/15/2017  PREOP DIAGNOSIS:    >80 % Left carotid stenosis   POSTOP DIAGNOSIS:    Same  PROCEDURE:    LEFT CAROTID ENDARTERECTOMY WITH BOVINE PERICARDIAL PATCH ANGIOPLASTY:   SURGEON: Judeth Cornfield. Scot Dock, MD, FACS  ASSIST: Leontine Locket, PA  ANESTHESIA: Gen.   EBL: Minimal  INDICATIONS:    Paul Mcneil is a 71 y.o. male who presented with symptomatic coronary artery disease and was scheduled for coronary revascularization. Preoperative duplex scan showed a 60-79% right carotid stenosis with a very tight greater than 80% left carotid stenosis. Combined left carotid endarterectomy and coronary revascularization was recommended in order to lower his risk of perioperative and future stroke.   FINDINGS:    Hemorrhagic plaque with a greater than 90% stenosis in the left carotid artery.   TECHNIQUE:   The patient was taken to the operating room after monitoring lines were placed by anesthesia. The neck and upper chest were prepped and draped in usual sterile fashion. The patient was also prepped for coronary revascularization. An incision was made along the anterior border of the sternocleidomastoid on the left and the dissection carried down to the common carotid artery which was dissected free and controlled with a Rummel tourniquet. The patient was heparinized. Next the facial vein was divided between 2-0 silk ties. I then controlled the superior thyroid artery and external carotid artery. Distally I was able to control the internal carotid artery above the plaque with a vessel loop. Clamps were then placed on the internal, then the common and the external carotid artery. A longitudinal arteriotomy was made in the common carotid artery. This was extended through the hemorrhagic plaque up into the normal-appearing internal carotid artery. A 12 shunt was placed into the internal carotid artery,  backbled and then placed into the common carotid artery and secured with Rummel tourniquet. Flow was reestablished through the shunt. Flow through the shunt was checked with the Doppler.   An endarterectomy plane was established proximally and the plaque was sharply divided. Eversion endarterectomy was performed of the external carotid artery. Distally was a nice taper in the plaque in the internal carotid artery and no tacking sutures were. The artery was irrigated with copious amounts of heparin and dextran and all loose debris removed. A bovine pericardial patch was then sewn using continuous 6-0 Prolene suture. Prior to completing the patch closure, the shunt was removed, the arteries were backbled and flushed appropriately and the anastomosis completed. Flow was reestablished first to the external carotid artery and then to the internal carotid artery. At the completion was an excellent Doppler signal in the internal carotid artery with good diastolic flow. Hemostasis was obtained in the wound.   A sponge was placed in the wound and then the wound loosely closed with staples. This would be closed at the completion of the coronary aspect of the procedure once the patient was not fully heparinized. This is dictated separately by Dr. Roxan Hockey.   [name], MD, FACS [facility]  DATE OF DICTATION:   02/15/2017

## 2017-02-15 NOTE — Brief Op Note (Addendum)
02/15/2017  12:45 PM  PATIENT:  Paul Mcneil  71 y.o. male  PRE-OPERATIVE DIAGNOSIS:  Left main and 3 vessel CAD I65.22  POST-OPERATIVE DIAGNOSIS:  Left main and 3 vessel CAD  PROCEDURE:  Procedure(s): CORONARY ARTERY BYPASS GRAFTING (CABG) (N/A) LIMA to LAD SVG to PDA SVG to Ramus and OM1   TRANSESOPHAGEAL ECHOCARDIOGRAM (TEE) (N/A)  LEFT ENDARTERECTOMY CAROTID (Left)- Dr. Scot Dock  SURGEON:  Surgeon(s) and Role: Panel 1:    * Melrose Nakayama, MD - Primary  Panel 2:    * Angelia Mould, MD - Primary  PHYSICIAN ASSISTANT:  Nicholes Rough, PA-C   ANESTHESIA:   general  EBL:  Total I/O In: 2400 [I.V.:2400] Out: 625 [Urine:375; Blood:250]  BLOOD ADMINISTERED:none  DRAINS: ROUTINE   LOCAL MEDICATIONS USED:  NONE  SPECIMEN:  No Specimen  DISPOSITION OF SPECIMEN: N/A  COUNTS:  YES  PLAN OF CARE: Admit to inpatient   PATIENT DISPOSITION:  ICU - intubated and hemodynamically stable.   Delay start of Pharmacological VTE agent (>24hrs) due to surgical blood loss or risk of bleeding: yes  XC= 69 min CPB= 104 min TEE preserved LV function, mild MR Good targets and conduits

## 2017-02-15 NOTE — Progress Notes (Signed)
CT surgery p.m. Rounds  Stable after combined carotid and CABG Neuro intact Some nausea this evening Hemodynamic stable No hematoma in neck

## 2017-02-15 NOTE — Interval H&P Note (Signed)
History and Physical Interval Note:  For CABG + left CEA today Creatinine 1.9 stable  02/15/2017 7:21 AM  Paul Mcneil  has presented today for surgery, with the diagnosis of CAD I65.22  The various methods of treatment have been discussed with the patient and family. After consideration of risks, benefits and other options for treatment, the patient has consented to  Procedure(s): CORONARY ARTERY BYPASS GRAFTING (CABG) (N/A) TRANSESOPHAGEAL ECHOCARDIOGRAM (TEE) (N/A) LEFT ENDARTERECTOMY CAROTID (Left) as a surgical intervention .  The patient's history has been reviewed, patient examined, no change in status, stable for surgery.  I have reviewed the patient's chart and labs.  Questions were answered to the patient's satisfaction.     Melrose Nakayama

## 2017-02-15 NOTE — Anesthesia Postprocedure Evaluation (Signed)
Anesthesia Post Note  Patient: Paul Mcneil  Procedure(s) Performed: Procedure(s) (LRB): CORONARY ARTERY BYPASS GRAFTING (CABG)times four. LAD to  LIMA, SVG to PDA , Sequential SVG to OM1 and Ramus (N/A) TRANSESOPHAGEAL ECHOCARDIOGRAM (TEE) (N/A) LEFT ENDARTERECTOMY CAROTID (Left)     Patient location during evaluation: SICU Anesthesia Type: General Level of consciousness: awake, patient cooperative and patient remains intubated per anesthesia plan Pain management: pain level controlled Vital Signs Assessment: post-procedure vital signs reviewed and stable Respiratory status: spontaneous breathing and patient remains intubated per anesthesia plan (preparing for extubation) Cardiovascular status: blood pressure returned to baseline and stable Postop Assessment: no signs of nausea or vomiting Anesthetic complications: no    Last Vitals:  Vitals:   02/15/17 1725 02/15/17 1745  BP:  (!) 115/59  Pulse: 80 80  Resp: (!) 29 (!) 22  Temp: (!) 36.3 C 36.5 C    Last Pain:  Vitals:   02/15/17 0611  TempSrc: Oral                 Madisynn Plair,E. Delmo Matty

## 2017-02-15 NOTE — Anesthesia Procedure Notes (Signed)
Arterial Line Insertion Start/End7/27/2018 6:45 AM, 02/15/2017 7:00 AM Performed by: Leonor Liv, CRNA  Patient location: Pre-op. Preanesthetic checklist: patient identified, IV checked, risks and benefits discussed, surgical consent, monitors and equipment checked, pre-op evaluation and timeout performed Lidocaine 1% used for infiltration and patient sedated Left, radial was placed Catheter size: 20 G Hand hygiene performed  and maximum sterile barriers used  Allen's test indicative of satisfactory collateral circulation Attempts: 1 Procedure performed without using ultrasound guided technique. Following insertion, dressing applied and Biopatch. Post procedure assessment: normal  Patient tolerated the procedure well with no immediate complications.

## 2017-02-15 NOTE — Anesthesia Procedure Notes (Signed)
Central Venous Catheter Insertion Performed by: Annye Asa, anesthesiologist Start/End7/27/2018 6:27 AM, 02/15/2017 6:58 AM Patient location: Pre-op. Preanesthetic checklist: patient identified, IV checked, site marked, risks and benefits discussed, surgical consent, monitors and equipment checked, pre-op evaluation, timeout performed and anesthesia consent Position: supine Lidocaine 1% used for infiltration Hand hygiene performed , maximum sterile barriers used  and Seldinger technique used Catheter size: 8.5 Fr PA cath was placed.Sheath introducer Swan type:thermodilution Procedure performed using ultrasound guided technique. Ultrasound Notes:anatomy identified, needle tip was noted to be adjacent to the nerve/plexus identified, no ultrasound evidence of intravascular and/or intraneural injection and image(s) printed for medical record Attempts: 1 Following insertion, line sutured and dressing applied. Post procedure assessment: blood return through all ports, free fluid flow and no air  Patient tolerated the procedure well with no immediate complications. Additional procedure comments: PA catheter:  Routine monitors. Timeout, sterile prep, drape, FBP R neck. Supine position.  1% Lido local, finder and trocar RIJ 1st pass with US guidance.  Cordis placed over J wire. PA catheter in easily.  Sterile dressing applied.  Patient tolerated well, VSS.  Jenita Seashore, MD.

## 2017-02-15 NOTE — Transfer of Care (Signed)
Immediate Anesthesia Transfer of Care Note  Patient: Paul Mcneil  Procedure(s) Performed: Procedure(s): CORONARY ARTERY BYPASS GRAFTING (CABG)times four. LAD to  LIMA, SVG to PDA , Sequential SVG to OM1 and Ramus (N/A) TRANSESOPHAGEAL ECHOCARDIOGRAM (TEE) (N/A) LEFT ENDARTERECTOMY CAROTID (Left)  Patient Location: SICU  Anesthesia Type:General  Level of Consciousness: sedated and Patient remains intubated per anesthesia plan  Airway & Oxygen Therapy: Patient remains intubated per anesthesia plan and Patient placed on Ventilator (see vital sign flow sheet for setting)  Post-op Assessment: Report given to RN and Post -op Vital signs reviewed and stable  Post vital signs: Reviewed and stable  Last Vitals:  Vitals:   02/15/17 0611 02/15/17 1455  BP: 116/70   Pulse: (!) 52 90  Resp: 18 20  Temp: (!) 36.4 C     Last Pain:  Vitals:   02/15/17 0611  TempSrc: Oral      Patients Stated Pain Goal: 3 (89/37/34 2876)  Complications: No apparent anesthesia complications

## 2017-02-16 ENCOUNTER — Inpatient Hospital Stay (HOSPITAL_COMMUNITY): Payer: Medicare Other

## 2017-02-16 LAB — POCT I-STAT, CHEM 8
BUN: 31 mg/dL — ABNORMAL HIGH (ref 6–20)
Calcium, Ion: 1.14 mmol/L — ABNORMAL LOW (ref 1.15–1.40)
Chloride: 99 mmol/L — ABNORMAL LOW (ref 101–111)
Creatinine, Ser: 2.2 mg/dL — ABNORMAL HIGH (ref 0.61–1.24)
Glucose, Bld: 170 mg/dL — ABNORMAL HIGH (ref 65–99)
HEMATOCRIT: 27 % — AB (ref 39.0–52.0)
HEMOGLOBIN: 9.2 g/dL — AB (ref 13.0–17.0)
POTASSIUM: 4.4 mmol/L (ref 3.5–5.1)
SODIUM: 134 mmol/L — AB (ref 135–145)
TCO2: 21 mmol/L (ref 0–100)

## 2017-02-16 LAB — GLUCOSE, CAPILLARY
GLUCOSE-CAPILLARY: 125 mg/dL — AB (ref 65–99)
GLUCOSE-CAPILLARY: 163 mg/dL — AB (ref 65–99)
GLUCOSE-CAPILLARY: 180 mg/dL — AB (ref 65–99)
GLUCOSE-CAPILLARY: 136 mg/dL — AB (ref 65–99)
Glucose-Capillary: 138 mg/dL — ABNORMAL HIGH (ref 65–99)
Glucose-Capillary: 145 mg/dL — ABNORMAL HIGH (ref 65–99)
Glucose-Capillary: 145 mg/dL — ABNORMAL HIGH (ref 65–99)
Glucose-Capillary: 147 mg/dL — ABNORMAL HIGH (ref 65–99)
Glucose-Capillary: 152 mg/dL — ABNORMAL HIGH (ref 65–99)
Glucose-Capillary: 166 mg/dL — ABNORMAL HIGH (ref 65–99)
Glucose-Capillary: 93 mg/dL (ref 65–99)

## 2017-02-16 LAB — CBC
HCT: 29.1 % — ABNORMAL LOW (ref 39.0–52.0)
HCT: 27.9 % — ABNORMAL LOW (ref 39.0–52.0)
HEMOGLOBIN: 9.6 g/dL — AB (ref 13.0–17.0)
HEMOGLOBIN: 9.2 g/dL — AB (ref 13.0–17.0)
MCH: 27.9 pg (ref 26.0–34.0)
MCH: 28.5 pg (ref 26.0–34.0)
MCHC: 33 g/dL (ref 30.0–36.0)
MCHC: 33 g/dL (ref 30.0–36.0)
MCV: 84.6 fL (ref 78.0–100.0)
MCV: 86.4 fL (ref 78.0–100.0)
PLATELETS: 174 10*3/uL (ref 150–400)
Platelets: 167 10*3/uL (ref 150–400)
RBC: 3.44 MIL/uL — AB (ref 4.22–5.81)
RBC: 3.23 MIL/uL — AB (ref 4.22–5.81)
RDW: 13.3 % (ref 11.5–15.5)
RDW: 13.5 % (ref 11.5–15.5)
WBC: 11.7 10*3/uL — AB (ref 4.0–10.5)
WBC: 13.1 10*3/uL — AB (ref 4.0–10.5)

## 2017-02-16 LAB — BASIC METABOLIC PANEL
ANION GAP: 7 (ref 5–15)
BUN: 27 mg/dL — ABNORMAL HIGH (ref 6–20)
CHLORIDE: 106 mmol/L (ref 101–111)
CO2: 22 mmol/L (ref 22–32)
Calcium: 7.9 mg/dL — ABNORMAL LOW (ref 8.9–10.3)
Creatinine, Ser: 2 mg/dL — ABNORMAL HIGH (ref 0.61–1.24)
GFR, EST AFRICAN AMERICAN: 37 mL/min — AB (ref >60)
GFR, EST NON AFRICAN AMERICAN: 32 mL/min — AB (ref >60)
Glucose, Bld: 174 mg/dL — ABNORMAL HIGH (ref 65–99)
POTASSIUM: 4.7 mmol/L (ref 3.5–5.1)
SODIUM: 135 mmol/L (ref 135–145)

## 2017-02-16 LAB — CREATININE, SERUM
CREATININE: 2.25 mg/dL — AB (ref 0.61–1.24)
GFR, EST AFRICAN AMERICAN: 32 mL/min — AB (ref >60)
GFR, EST NON AFRICAN AMERICAN: 28 mL/min — AB (ref >60)

## 2017-02-16 LAB — MAGNESIUM
MAGNESIUM: 2.2 mg/dL (ref 1.7–2.4)
MAGNESIUM: 1.9 mg/dL (ref 1.7–2.4)

## 2017-02-16 MED ORDER — INSULIN DETEMIR 100 UNIT/ML ~~LOC~~ SOLN
14.0000 [IU] | Freq: Two times a day (BID) | SUBCUTANEOUS | Status: DC
  Administered 2017-02-16 – 2017-02-18 (×5): 14 [IU] via SUBCUTANEOUS
  Filled 2017-02-16 (×9): qty 0.14

## 2017-02-16 MED ORDER — INSULIN ASPART 100 UNIT/ML ~~LOC~~ SOLN
0.0000 [IU] | SUBCUTANEOUS | Status: DC
  Administered 2017-02-16: 2 [IU] via SUBCUTANEOUS
  Administered 2017-02-16: 4 [IU] via SUBCUTANEOUS
  Administered 2017-02-16: 2 [IU] via SUBCUTANEOUS

## 2017-02-16 MED ORDER — FUROSEMIDE 10 MG/ML IJ SOLN
20.0000 mg | Freq: Two times a day (BID) | INTRAMUSCULAR | Status: DC
  Administered 2017-02-16 – 2017-02-17 (×3): 20 mg via INTRAVENOUS
  Filled 2017-02-16 (×3): qty 2

## 2017-02-16 MED ORDER — METOCLOPRAMIDE HCL 5 MG/ML IJ SOLN: 10.0000 mg | Freq: Four times a day (QID) | INTRAMUSCULAR | Status: DC

## 2017-02-16 MED ORDER — INSULIN ASPART 100 UNIT/ML ~~LOC~~ SOLN
4.0000 [IU] | Freq: Three times a day (TID) | SUBCUTANEOUS | Status: DC
  Administered 2017-02-16 – 2017-02-19 (×9): 4 [IU] via SUBCUTANEOUS

## 2017-02-16 MED ORDER — MORPHINE SULFATE (PF) 4 MG/ML IV SOLN
1.0000 mg | INTRAVENOUS | Status: AC | PRN
  Administered 2017-02-16 (×3): 4 mg via INTRAVENOUS
  Filled 2017-02-16 (×3): qty 1

## 2017-02-16 MED ORDER — CHLORHEXIDINE GLUCONATE 0.12 % MT SOLN
OROMUCOSAL | Status: AC
  Filled 2017-02-16: qty 15

## 2017-02-16 NOTE — Progress Notes (Signed)
Patient ID: Paul Mcneil, male   DOB: 17-Feb-1946, 71 y.o.   MRN: [id number] Looks great this morning. Extubated and comfortable. No hoarseness. Neurologically intact. Left neck without hematoma Stable postop day 1 left carotid endarterectomy and coronary artery bypass grafting. Plan per TC TS

## 2017-02-16 NOTE — Op Note (Signed)
Paul, Mcneil            ACCOUNT NO.:  [id number]  MEDICAL RECORD NO.:  [id number]  LOCATION:                                 FACILITY:  PHYSICIAN:  Revonda Standard. Roxan Hockey, M.D. DATE OF BIRTH:  DATE OF PROCEDURE:  02/15/2017 DATE OF DISCHARGE:                              OPERATIVE REPORT   POSTOPERATIVE DIAGNOSES:  Left main and 3-vessel coronary artery disease and left carotid stenosis.  POSTOPERATIVE DIAGNOSES:  Left main and 3-vessel coronary artery disease and left carotid stenosis.  PROCEDURES: 1. Left carotid endarterectomy (Dr. [name]). 2. Median sternotomy. 3. Extracorporeal circulation. 4. Coronary artery bypass grafting x4 (left internal mammary artery to     LAD, saphenous vein graft to posterior descending, sequential     saphenous vein graft to ramus intermedius and obtuse marginal 2). 5. Endoscopic vein harvest, right leg.  SURGEON:  Revonda Standard. Roxan Hockey, MD.  ASSISTANT:  Nicholes Rough, PA.  ANESTHESIA:  General.  FINDINGS:  Transesophageal echocardiography revealed severe atherosclerotic disease in the descending aorta.  Ascending aorta relatively spared.  Calcification in the aortic root.  Preserved left ventricular function.  Mild mitral regurgitation.  Good quality conduits and good quality targets.  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  His evaluation included a stress test, which was positive and cardiac catheterization which showed left main and 3- vessel coronary artery disease.  During his preoperative evaluation, he was found to have significant carotid stenosis bilaterally with the left greater than right.  He was seen in consultation by Vascular Surgery who recommended concomitant left carotid endarterectomy at the time of coronary artery bypass grafting.  The indications, risks, benefits,  and alternatives were discussed in detail with the patient.  He understood and accepted the risks and agreed to proceed.  OPERATIVE NOTE:  [name] was brought to the preoperative holding area on February 15, 2017.  Anesthesia placed a Swan-Ganz catheter and arterial blood pressure monitoring line.  He was taken to the operating room, anesthetized, and intubated.  Intravenous antibiotics were administered. Transesophageal echocardiography was performed; findings as previously noted.  The neck, chest, abdomen, and legs were prepped and draped in the usual sterile fashion.  Dr. [name] performed a left carotid endarterectomy.  Please refer to his separately dictated note for details of the procedure.  After completion of the carotid endarterectomy, a median sternotomy was performed.  The left internal mammary artery was harvested using standard technique.  It was a good quality vessel with excellent flow distally.  Of note, there was marked hyperinflation of the lungs consistent with COPD.  The saphenous vein had been harvested endoscopically beginning during the carotid endarterectomy, but was not removed from the leg until after completion of the carotid endarterectomy.  It was also a good quality vessel.  The patient was fully heparinized.  A sternal retractor was placed.  The pericardium was opened.  The ascending aorta was inspected.  There was significant plaque distally in the ascending aorta near the takeoff of the innominate artery.  The remainder of the ascending aorta was relatively free of any  plaque, consistent with the findings on transesophageal echocardiography.  After confirming adequate anticoagulation with ACT measurement, the aorta was cannulated via concentric 2-0 Ethibond pledgeted pursestring sutures.  A dual-stage venous cannula was placed via pursestring suture in the right atrial appendage.  Cardiopulmonary bypass was initiated.  Flows were  maintained per protocol.  The patient was cooled to 32 degrees Celsius.  The coronary arteries were inspected and anastomotic sites were chosen.  The conduits were inspected and cut to length.  A foam pad was placed in the pericardium to insulate the heart.  A temperature probe was placed in the myocardial septum, and a cardioplegia cannula was placed in the ascending aorta.  The aorta was crossclamped.  The left ventricle was emptied via the aortic root vent.  Cardiac arrest then was achieved with a combination of cold antegrade, blood cardioplegia, and topical iced saline.  1 L of cardioplegia was administered.  There was rapid diastolic arrest and septal cooling to 80 degrees Celsius.  A reversed saphenous vein graft was placed end to side to the posterior descending branch of the right coronary.  These were both good quality vessels.  The posterior descending was 1.5 mm target.  An end-to-side anastomosis was performed with a running 7-0 Prolene suture.  All anastomoses were probed proximally and distally at their completion prior to tying the suture to ensure patency.  Cardioplegia was administered down the graft.  There were good flow and good hemostasis.  A reversed saphenous vein graft then was placed sequentially to the ramus intermedius and second obtuse marginal.  The ramus intermedius was a high anterolateral branch that was intramyocardial.  It was a 1.5-mm good quality target.  OM-2 was the dominant posterolateral branch. There was a 2-mm good quality vessel.  A side-to-side anastomosis was performed to the ramus and an end-to-side to OM-2.  Both were done with running 7-0 Prolene sutures.  There was excellent flow through the graft with cardioplegia administration.  There was good hemostasis at both anastomoses.  Additional cardioplegia was administered down the aortic root.  The left internal mammary artery was brought through a window in the pericardium. The distal  end was beveled.  It was anastomosed end to side to the distal LAD.  The LAD was a 1.5-mm, good quality target.  The mammary was a 1.5-mm, good quality conduit.  The end-to-side anastomosis was performed with a running 8-0 Prolene suture.  At the completion of anastomosis, the bulldog clamp was briefly removed to inspect for hemostasis.  Rapid septal rewarming was noted.  The bulldog clamp was replaced.  The mammary pedicle was tacked to the epicardial surface of the heart with 6-0 Prolene sutures.  Rewarming was begun.  The cardioplegia cannula was removed from the ascending aorta.  The vein grafts were cut to length.  The proximal vein graft anastomoses were performed to 4.5 mm punch aortotomies with running 6-0 Prolene sutures.  The aorta was normal with no significant atherosclerotic disease at the site of the proximal anastomoses.  At the completion of the final proximal anastomosis, the patient was placed in Trendelenburg position.  Lidocaine was administered.  The bulldog clamp was again removed from the left mammary artery.  The heart and aortic root were de-aired and the aortic crossclamp was removed.  The a total crossclamp time was 69 minutes.  The patient spontaneously resumed a bradycardic rhythm.  He did not require defibrillation.  While rewarming was completed, all proximal and distal anastomoses were inspected for hemostasis.  Epicardial pacing wires were placed on the right ventricle and right atrium.  DDD pacing was initiated at 90 beats per minute.  When the patient had rewarmed to a core temperature of 37 degrees Celsius, he was weaned from cardiopulmonary bypass on the first attempt without inotropic support.  Total bypass time was 104 minutes. The initial cardiac index was greater than 2 L/min/m2 and the patient remained hemodynamically stable throughout the post-bypass period.  A test dose of protamine was administered and it was well tolerated. The atrial and  aortic cannulae were removed.  The remainder of the protamine was administered without incident.  The chest was irrigated with warm saline.  Post-bypass transesophageal echocardiography was essentially unchanged from the prebypass study.  Initially, there was slightly more mitral regurgitation, but that returned to normal during the post bypass.  The pericardium was reapproximated over the aortic root and ascending aorta with interrupted 3-0 silk sutures.  Left pleural and mediastinal chest tubes were placed through separate subcostal incisions.  The sternum was closed with a combination of single and double heavy gauge stainless steel wires.  Pectoralis fascia, subcutaneous tissue, and skin were closed in a standard fashion.  All sponge, needle, and instrument counts were correct at the end of the procedure.  The patient was taken from the operating room to the Surgical Intensive Care Unit in critical but stable condition.     Revonda Standard Roxan Hockey, M.D.     SCH/MEDQ  D:  02/15/2017  T:  02/16/2017  Job:  [id number]

## 2017-02-16 NOTE — Progress Notes (Signed)
1 Day Post-Op Procedure(s) (LRB): CORONARY ARTERY BYPASS GRAFTING (CABG)times four. LAD to  LIMA, SVG to PDA , Sequential SVG to OM1 and Ramus (N/A) TRANSESOPHAGEAL ECHOCARDIOGRAM (TEE) (N/A) LEFT ENDARTERECTOMY CAROTID (Left) Subjective: CABG L CEA A- paced for bradycardia Low dose neo  Objective: Vital signs in last 24 hours: Temp:  [97.2 F (36.2 C)-99.5 F (37.5 C)] 99.1 F (37.3 C) (07/28 0645) Pulse Rate:  [69-91] 89 (07/28 0645) Cardiac Rhythm: Atrial paced (07/28 0400) Resp:  [13-31] 14 (07/28 0645) BP: (99-133)/(46-78) 123/63 (07/28 0600) SpO2:  [90 %-100 %] 98 % (07/28 0645) Arterial Line BP: (76-137)/(42-77) 80/43 (07/28 0645) FiO2 (%):  [40 %-50 %] 40 % (07/27 1745) Weight:  [251 lb (113.9 kg)] 251 lb (113.9 kg) (07/28 0615)  Hemodynamic parameters for last 24 hours: PAP: (18-39)/(4-21) 21/12 CO:  [4 L/min-5.9 L/min] 5.6 L/min CI:  [1.8 L/min/m2-2.5 L/min/m2] 2.4 L/min/m2  Intake/Output from previous day: 07/27 0701 - 07/28 0700 In: 5008.7 [P.O.:530; I.V.:3878.7; Blood:200; IV Piggyback:400] Out: 2760 [Urine:1630; Blood:650; Chest Tube:480] Intake/Output this shift: Total I/O In: 4.6 [I.V.:4.6] Out: 105 [Urine:105]       Exam    General- alert and comfortable   Lungs- clear without rales, wheezes   Cor- regular rate and rhythm, no murmur , gallop   Abdomen- soft, non-tender   Extremities - warm, non-tender, minimal edema   Neuro- oriented, appropriate, no focal weakness   Lab Results:  Recent Labs  02/15/17 2025 02/15/17 2030 02/16/17 0517  WBC 12.1*  --  11.7*  HGB 10.0* 9.2* 9.6*  HCT 30.4* 27.0* 29.1*  PLT 197  --  174   BMET:  Recent Labs  02/13/17 1320  02/15/17 2030 02/16/17 0517  NA 137  < > 140 135  K 4.4  < > 4.5 4.7  CL 107  < > 106 106  CO2 20*  --   --  22  GLUCOSE 151*  < > 111* 174*  BUN 28*  < > 29* 27*  CREATININE 1.91*  < > 1.90* 2.00*  CALCIUM 9.2  --   --  7.9*  < > = values in this interval not displayed.   PT/INR:  Recent Labs  02/15/17 1525  LABPROT 15.9*  INR 1.26   ABG    Component Value Date/Time   PHART 7.379 02/15/2017 1921   HCO3 22.2 02/15/2017 1921   TCO2 22 02/15/2017 2030   ACIDBASEDEF 3.0 (H) 02/15/2017 1921   O2SAT 94.0 02/15/2017 1921   CBG (last 3)   Recent Labs  02/16/17 0536 02/16/17 0640 02/16/17 0803  GLUCAP 152* 145* 125*    Assessment/Plan: S/P Procedure(s) (LRB): CORONARY ARTERY BYPASS GRAFTING (CABG)times four. LAD to  LIMA, SVG to PDA , Sequential SVG to OM1 and Ramus (N/A) TRANSESOPHAGEAL ECHOCARDIOGRAM (TEE) (N/A) LEFT ENDARTERECTOMY CAROTID (Left) Mobilize Diuresis Diabetes control See progression orders   LOS: 1 day    Paul Mcneil 02/16/2017

## 2017-02-16 NOTE — Progress Notes (Signed)
CT surgery p.m. Rounds  Patient examined and record reviewed.Hemodynamics stable,labs satisfactory.Patient had stable day.Continue current care. Paul Mcneil 02/16/2017

## 2017-02-17 ENCOUNTER — Inpatient Hospital Stay (HOSPITAL_COMMUNITY): Payer: Medicare Other

## 2017-02-17 LAB — BASIC METABOLIC PANEL
Anion gap: 7 (ref 5–15)
BUN: 34 mg/dL — ABNORMAL HIGH (ref 6–20)
CO2: 24 mmol/L (ref 22–32)
Calcium: 8.3 mg/dL — ABNORMAL LOW (ref 8.9–10.3)
Chloride: 102 mmol/L (ref 101–111)
Creatinine, Ser: 2.39 mg/dL — ABNORMAL HIGH (ref 0.61–1.24)
GFR calc Af Amer: 30 mL/min — ABNORMAL LOW (ref >60)
GFR calc non Af Amer: 26 mL/min — ABNORMAL LOW (ref >60)
Glucose, Bld: 115 mg/dL — ABNORMAL HIGH (ref 65–99)
Potassium: 4.1 mmol/L (ref 3.5–5.1)
Sodium: 133 mmol/L — ABNORMAL LOW (ref 135–145)

## 2017-02-17 LAB — GLUCOSE, CAPILLARY
GLUCOSE-CAPILLARY: 105 mg/dL — AB (ref 65–99)
GLUCOSE-CAPILLARY: 172 mg/dL — AB (ref 65–99)
GLUCOSE-CAPILLARY: 193 mg/dL — AB (ref 65–99)
Glucose-Capillary: 110 mg/dL — ABNORMAL HIGH (ref 65–99)
Glucose-Capillary: 119 mg/dL — ABNORMAL HIGH (ref 65–99)
Glucose-Capillary: 147 mg/dL — ABNORMAL HIGH (ref 65–99)

## 2017-02-17 LAB — CBC
HCT: 27.8 % — ABNORMAL LOW (ref 39.0–52.0)
Hemoglobin: 9.2 g/dL — ABNORMAL LOW (ref 13.0–17.0)
MCH: 28.3 pg (ref 26.0–34.0)
MCHC: 33.1 g/dL (ref 30.0–36.0)
MCV: 85.5 fL (ref 78.0–100.0)
Platelets: 163 10*3/uL (ref 150–400)
RBC: 3.25 MIL/uL — ABNORMAL LOW (ref 4.22–5.81)
RDW: 13.7 % (ref 11.5–15.5)
WBC: 11.5 10*3/uL — ABNORMAL HIGH (ref 4.0–10.5)

## 2017-02-17 MED ORDER — MOVING RIGHT ALONG BOOK
Freq: Once | Status: AC
  Administered 2017-02-17: 1
  Filled 2017-02-17: qty 1

## 2017-02-17 MED ORDER — SODIUM CHLORIDE 0.9% FLUSH: 3.0000 mL | INTRAVENOUS | Status: DC | PRN

## 2017-02-17 MED ORDER — SODIUM CHLORIDE 0.9% FLUSH
3.0000 mL | Freq: Two times a day (BID) | INTRAVENOUS | Status: DC
  Administered 2017-02-17 – 2017-02-19 (×6): 3 mL via INTRAVENOUS

## 2017-02-17 MED ORDER — SODIUM CHLORIDE 0.9 % IV SOLN: 250.0000 mL | INTRAVENOUS | Status: DC | PRN

## 2017-02-17 MED ORDER — INSULIN ASPART 100 UNIT/ML ~~LOC~~ SOLN
0.0000 [IU] | Freq: Three times a day (TID) | SUBCUTANEOUS | Status: DC
  Administered 2017-02-17 (×2): 4 [IU] via SUBCUTANEOUS
  Administered 2017-02-17 – 2017-02-19 (×5): 2 [IU] via SUBCUTANEOUS

## 2017-02-17 MED ORDER — METOPROLOL TARTRATE 12.5 MG HALF TABLET
12.5000 mg | ORAL_TABLET | Freq: Two times a day (BID) | ORAL | Status: DC
  Administered 2017-02-17 (×2): 12.5 mg via ORAL
  Filled 2017-02-17 (×2): qty 1

## 2017-02-17 MED ORDER — IPRATROPIUM-ALBUTEROL 0.5-2.5 (3) MG/3ML IN SOLN: 3.0000 mL | Freq: Four times a day (QID) | RESPIRATORY_TRACT | Status: DC | PRN

## 2017-02-17 NOTE — Progress Notes (Addendum)
  Progress Note    02/17/2017 9:40 AM 2 Days Post-Op  Subjective:  No complaints; wants foley out  Vitals:   02/17/17 0600 02/17/17 0700  BP: (!) 154/77 (!) 151/76  Pulse: 89 89  Resp: 17 12  Temp:       Physical Exam: Neuro:  In tact Lungs:  Non labored Incision:  Clean and dry without hematoma; mild ecchymosis around left neck incision  CBC    Component Value Date/Time   WBC 11.5 (H) 02/17/2017 0419   RBC 3.25 (L) 02/17/2017 0419   HGB 9.2 (L) 02/17/2017 0419   HGB 12.9 (L) 01/24/2017 1535   HCT 27.8 (L) 02/17/2017 0419   HCT 39.2 01/24/2017 1535   PLT 163 02/17/2017 0419   PLT 242 01/24/2017 1535   MCV 85.5 02/17/2017 0419   MCV 89 01/24/2017 1535   MCH 28.3 02/17/2017 0419   MCHC 33.1 02/17/2017 0419   RDW 13.7 02/17/2017 0419   RDW 13.5 01/24/2017 1535   LYMPHSABS 1.1 12/11/2013 0957   MONOABS 1.1 (H) 12/11/2013 0957   EOSABS 0.0 12/11/2013 0957   BASOSABS 0.0 12/11/2013 0957    BMET    Component Value Date/Time   NA 133 (L) 02/17/2017 0419   NA 137 01/24/2017 1535   K 4.1 02/17/2017 0419   CL 102 02/17/2017 0419   CO2 24 02/17/2017 0419   GLUCOSE 115 (H) 02/17/2017 0419   BUN 34 (H) 02/17/2017 0419   BUN 22 01/24/2017 1535   CREATININE 2.39 (H) 02/17/2017 0419   CALCIUM 8.3 (L) 02/17/2017 0419   GFRNONAA 26 (L) 02/17/2017 0419   GFRAA 30 (L) 02/17/2017 0419     Intake/Output Summary (Last 24 hours) at 02/17/17 0940 Last data filed at 02/17/17 0700  Gross per 24 hour  Intake              635 ml  Output             2255 ml  Net            -1620 ml     Assessment/Plan:  This is a 71 y.o. male who is s/p left CEA 2 Days Post-Op  -pt is doing well this am. -pt neuro exam is in tact -pt has ambulated -pt still has foley -f/u with Dr. Scot Dock in 2 weeks.   Leontine Locket, PA-C [facility] [phone]  I have examined the patient, reviewed and agree with above.  Curt Jews, MD 02/17/2017 11:02 AM

## 2017-02-17 NOTE — Progress Notes (Signed)
2 Days Post-Op Procedure(s) (LRB): CORONARY ARTERY BYPASS GRAFTING (CABG)times four. LAD to  LIMA, SVG to PDA , Sequential SVG to OM1 and Ramus (N/A) TRANSESOPHAGEAL ECHOCARDIOGRAM (TEE) (N/A) LEFT ENDARTERECTOMY CAROTID (Left) Subjective: Progressing well after CABG Creatinine has increased to baseline 2.2 with adequate urine output Ready for transfer to step down Sinus rhythm  Objective: Vital signs in last 24 hours: Temp:  [98.2 F (36.8 C)-98.9 F (37.2 C)] 98.2 F (36.8 C) (07/29 1713) Pulse Rate:  [87-90] 88 (07/29 1713) Cardiac Rhythm: Atrial paced (07/29 1500) Resp:  [12-22] 20 (07/29 1713) BP: (113-161)/(65-87) 132/74 (07/29 1713) SpO2:  [91 %-99 %] 99 % (07/29 1713) Weight:  [247 lb 5.7 oz (112.2 kg)] 247 lb 5.7 oz (112.2 kg) (07/29 0500)  Hemodynamic parameters for last 24 hours:    Intake/Output from previous day: 07/28 0701 - 07/29 0700 In: 719.6 [P.O.:360; I.V.:359.6] Out: 2430 [Urine:2330; Chest Tube:100] Intake/Output this shift: No intake/output data recorded.       Exam    General- alert and comfortable   Lungs- clear without rales, wheezes   Cor- regular rate and rhythm, no murmur , gallop   Abdomen- soft, non-tender   Extremities - warm, non-tender, minimal edema   Neuro- oriented, appropriate, no focal weakness   Lab Results:  Recent Labs  02/16/17 1704 02/16/17 1705 02/17/17 0419  WBC 13.1*  --  11.5*  HGB 9.2* 9.2* 9.2*  HCT 27.9* 27.0* 27.8*  PLT 167  --  163   BMET:  Recent Labs  02/16/17 0517  02/16/17 1705 02/17/17 0419  NA 135  --  134* 133*  K 4.7  --  4.4 4.1  CL 106  --  99* 102  CO2 22  --   --  24  GLUCOSE 174*  --  170* 115*  BUN 27*  --  31* 34*  CREATININE 2.00*  < > 2.20* 2.39*  CALCIUM 7.9*  --   --  8.3*  < > = values in this interval not displayed.  PT/INR:  Recent Labs  02/15/17 1525  LABPROT 15.9*  INR 1.26   ABG    Component Value Date/Time   PHART 7.379 02/15/2017 1921   HCO3 22.2 02/15/2017  1921   TCO2 21 02/16/2017 1705   ACIDBASEDEF 3.0 (H) 02/15/2017 1921   O2SAT 94.0 02/15/2017 1921   CBG (last 3)   Recent Labs  02/17/17 0822 02/17/17 1144 02/17/17 1546  GLUCAP 119* 172* 147*    Assessment/Plan: S/P Procedure(s) (LRB): CORONARY ARTERY BYPASS GRAFTING (CABG)times four. LAD to  LIMA, SVG to PDA , Sequential SVG to OM1 and Ramus (N/A) TRANSESOPHAGEAL ECHOCARDIOGRAM (TEE) (N/A) LEFT ENDARTERECTOMY CAROTID (Left) Mobilize Diuresis Diabetes control Plan for transfer to step-down: see transfer orders   LOS: 2 days    Tharon Aquas Trigt III 02/17/2017

## 2017-02-17 NOTE — Plan of Care (Signed)
Problem: Safety: Goal: Ability to remain free from injury will improve Outcome: Progressing Pt understands to call for help when getting out of bed. No falls or injuries this shift. Patient educated about fall prevention. Pt verbalized understanding.    

## 2017-02-17 NOTE — Progress Notes (Signed)
Pt just arrived on 4E as a transfer from [place]. No distress noted. Will continue to monitor

## 2017-02-18 ENCOUNTER — Inpatient Hospital Stay (HOSPITAL_COMMUNITY): Payer: Medicare Other

## 2017-02-18 ENCOUNTER — Encounter (HOSPITAL_COMMUNITY): Payer: Self-pay | Admitting: Thoracic Surgery (Cardiothoracic Vascular Surgery)

## 2017-02-18 LAB — GLUCOSE, CAPILLARY
GLUCOSE-CAPILLARY: 140 mg/dL — AB (ref 65–99)
GLUCOSE-CAPILLARY: 59 mg/dL — AB (ref 65–99)
Glucose-Capillary: 137 mg/dL — ABNORMAL HIGH (ref 65–99)
Glucose-Capillary: 100 mg/dL — ABNORMAL HIGH (ref 65–99)
Glucose-Capillary: 140 mg/dL — ABNORMAL HIGH (ref 65–99)
Glucose-Capillary: 133 mg/dL — ABNORMAL HIGH (ref 65–99)

## 2017-02-18 LAB — BASIC METABOLIC PANEL
Anion gap: 10 (ref 5–15)
BUN: 39 mg/dL — ABNORMAL HIGH (ref 6–20)
CO2: 22 mmol/L (ref 22–32)
Calcium: 8.3 mg/dL — ABNORMAL LOW (ref 8.9–10.3)
Chloride: 104 mmol/L (ref 101–111)
Creatinine, Ser: 2.34 mg/dL — ABNORMAL HIGH (ref 0.61–1.24)
GFR calc Af Amer: 31 mL/min — ABNORMAL LOW (ref >60)
GFR calc non Af Amer: 27 mL/min — ABNORMAL LOW (ref >60)
Glucose, Bld: 140 mg/dL — ABNORMAL HIGH (ref 65–99)
Potassium: 3.8 mmol/L (ref 3.5–5.1)
Sodium: 136 mmol/L (ref 135–145)

## 2017-02-18 LAB — CBC
HCT: 25.9 % — ABNORMAL LOW (ref 39.0–52.0)
Hemoglobin: 8.7 g/dL — ABNORMAL LOW (ref 13.0–17.0)
MCH: 28.6 pg (ref 26.0–34.0)
MCHC: 33.6 g/dL (ref 30.0–36.0)
MCV: 85.2 fL (ref 78.0–100.0)
Platelets: 159 10*3/uL (ref 150–400)
RBC: 3.04 MIL/uL — ABNORMAL LOW (ref 4.22–5.81)
RDW: 13.6 % (ref 11.5–15.5)
WBC: 9.3 10*3/uL (ref 4.0–10.5)

## 2017-02-18 MED ORDER — GLIPIZIDE 5 MG PO TABS
2.5000 mg | ORAL_TABLET | Freq: Every day | ORAL | Status: DC
  Administered 2017-02-18 – 2017-02-19 (×2): 2.5 mg via ORAL
  Filled 2017-02-18 (×2): qty 1

## 2017-02-18 MED ORDER — BISOPROLOL FUMARATE 5 MG PO TABS
5.0000 mg | ORAL_TABLET | Freq: Every day | ORAL | Status: DC
  Administered 2017-02-18 – 2017-02-20 (×3): 5 mg via ORAL
  Filled 2017-02-18 (×3): qty 1

## 2017-02-18 MED FILL — Dexmedetomidine HCl in NaCl 0.9% IV Soln 400 MCG/100ML: INTRAVENOUS | Qty: 100 | Status: AC

## 2017-02-18 NOTE — Progress Notes (Addendum)
[facility] [place]:   Doing well s/p left CEA. I will arrange f/u with me.  [name], MD, FACS [facility] [phone] Office: [phone]   Subjective  - No new complaints   Objective 130/73 87 98.4 F (36.9 C) (Oral) 20 93%  Intake/Output Summary (Last 24 hours) at 02/18/17 1660 Last data filed at 02/18/17 0600  Gross per 24 hour  Intake          1643.33 ml  Output             2100 ml  Net          -456.67 ml    Left neck incision healing well without hematoma No tongue deviation and smile is symmetric Lungs non labored breathing Heart Sinus brady   Assessment/Planning: POD # 3 left CEA No neurologic deficients  -f/u with Dr. Scot Dock in 2 weeks.   Laurence Slate [facility] 02/18/2017 7:13 AM --  Laboratory Lab Results:  Recent Labs  02/17/17 0419 02/18/17 0221  WBC 11.5* 9.3  HGB 9.2* 8.7*  HCT 27.8* 25.9*  PLT 163 159   BMET  Recent Labs  02/17/17 0419 02/18/17 0221  NA 133* 136  K 4.1 3.8  CL 102 104  CO2 24 22  GLUCOSE 115* 140*  BUN 34* 39*  CREATININE 2.39* 2.34*  CALCIUM 8.3* 8.3*    COAG Lab Results  Component Value Date   INR 1.26 02/15/2017   INR 0.98 02/13/2017   INR 0.9 01/24/2017   No results found for: PTT

## 2017-02-18 NOTE — Progress Notes (Signed)
Pt refusing to ambulate at this time.  States he will take another walk prior to bed this evening.  Education reinforced on importance of ambulation.  IS encouraged as well.

## 2017-02-18 NOTE — Progress Notes (Signed)
CARDIAC REHAB PHASE I   PRE:  Rate/Rhythm: 87 SR  BP:  Supine: 121/71  Sitting:   Standing:    SaO2: 92%RA  MODE:  Ambulation: 240 ft   POST:  Rate/Rhythm: 113 ST  BP:  Supine:   Sitting: 150/74  Standing:    SaO2: 90-93%RA 0910-0937 Pt walked 240 ft on RA with rolling walker and asst x 1. Needed assistance to get out of bed. Had a hard time turning on side so he could be assisted to roll and sit up. Encouraged sternal precautions. Pt does not like being attached to pacemaker.  Encouraged pursed lip breathing during walk. Tired easily . To recliner with call bell. Stopped once to rest.   Graylon Good, RN BSN  02/18/2017 9:34 AM

## 2017-02-18 NOTE — Progress Notes (Signed)
3 Days Post-Op Procedure(s) (LRB): CORONARY ARTERY BYPASS GRAFTING (CABG)times four. LAD to  LIMA, SVG to PDA , Sequential SVG to OM1 and Ramus (N/A) TRANSESOPHAGEAL ECHOCARDIOGRAM (TEE) (N/A) LEFT ENDARTERECTOMY CAROTID (Left) Subjective: Feels well this AM +BM  Objective: Vital signs in last 24 hours: Temp:  [98.2 F (36.8 C)-99.2 F (37.3 C)] 98.4 F (36.9 C) (07/30 0340) Pulse Rate:  [87-90] 87 (07/30 0340) Cardiac Rhythm: Atrial paced (07/30 0700) Resp:  [14-22] 20 (07/30 0340) BP: (113-153)/(65-83) 130/73 (07/30 0340) SpO2:  [91 %-99 %] 93 % (07/30 0340) Weight:  [252 lb 3.2 oz (114.4 kg)] 252 lb 3.2 oz (114.4 kg) (07/30 0339)  Hemodynamic parameters for last 24 hours:    Intake/Output from previous day: 07/29 0701 - 07/30 0700 In: 1643.3 [P.O.:1560; I.V.:83.3] Out: 2100 [Urine:2100] Intake/Output this shift: No intake/output data recorded.  General appearance: alert, cooperative and no distress Neurologic: intact Heart: regular rate and rhythm Lungs: diminished breath sounds bibasilar Wound: clean and dry  Lab Results:  Recent Labs  02/17/17 0419 02/18/17 0221  WBC 11.5* 9.3  HGB 9.2* 8.7*  HCT 27.8* 25.9*  PLT 163 159   BMET:  Recent Labs  02/17/17 0419 02/18/17 0221  NA 133* 136  K 4.1 3.8  CL 102 104  CO2 24 22  GLUCOSE 115* 140*  BUN 34* 39*  CREATININE 2.39* 2.34*  CALCIUM 8.3* 8.3*    PT/INR:  Recent Labs  02/15/17 1525  LABPROT 15.9*  INR 1.26   ABG    Component Value Date/Time   PHART 7.379 02/15/2017 1921   HCO3 22.2 02/15/2017 1921   TCO2 21 02/16/2017 1705   ACIDBASEDEF 3.0 (H) 02/15/2017 1921   O2SAT 94.0 02/15/2017 1921   CBG (last 3)   Recent Labs  02/17/17 1546 02/17/17 2045 02/18/17 0607  GLUCAP 147* 193* 100*    Assessment/Plan: S/P Procedure(s) (LRB): CORONARY ARTERY BYPASS GRAFTING (CABG)times four. LAD to  LIMA, SVG to PDA , Sequential SVG to OM1 and Ramus (N/A) TRANSESOPHAGEAL ECHOCARDIOGRAM (TEE)  (N/A) LEFT ENDARTERECTOMY CAROTID (Left) -  CV- stable, in SR- change pacer to VVI  RESP- continue IS   RENAL_ creatinine stable at 2.34- baseline 1.9  Diuresing well, follow  ENDO- CBG reasonably well controlled, will restart glucotrol  Anemia secondary to ABL- follow  GI- had BM- dc reglan  Continue cardiac rehab   LOS: 3 days    Melrose Nakayama 02/18/2017

## 2017-02-19 LAB — BASIC METABOLIC PANEL
Anion gap: 8 (ref 5–15)
BUN: 37 mg/dL — AB (ref 6–20)
CHLORIDE: 106 mmol/L (ref 101–111)
CO2: 26 mmol/L (ref 22–32)
CREATININE: 2.32 mg/dL — AB (ref 0.61–1.24)
Calcium: 8.6 mg/dL — ABNORMAL LOW (ref 8.9–10.3)
GFR calc Af Amer: 31 mL/min — ABNORMAL LOW (ref >60)
GFR calc non Af Amer: 27 mL/min — ABNORMAL LOW (ref >60)
GLUCOSE: 93 mg/dL (ref 65–99)
Potassium: 4 mmol/L (ref 3.5–5.1)
SODIUM: 140 mmol/L (ref 135–145)

## 2017-02-19 LAB — GLUCOSE, CAPILLARY
GLUCOSE-CAPILLARY: 149 mg/dL — AB (ref 65–99)
GLUCOSE-CAPILLARY: 140 mg/dL — AB (ref 65–99)
Glucose-Capillary: 116 mg/dL — ABNORMAL HIGH (ref 65–99)
Glucose-Capillary: 72 mg/dL (ref 65–99)

## 2017-02-19 MED ORDER — INSULIN DETEMIR 100 UNIT/ML ~~LOC~~ SOLN
10.0000 [IU] | Freq: Two times a day (BID) | SUBCUTANEOUS | Status: DC
  Administered 2017-02-19 – 2017-02-20 (×2): 10 [IU] via SUBCUTANEOUS
  Filled 2017-02-19 (×3): qty 0.1

## 2017-02-19 NOTE — Progress Notes (Addendum)
      [address]       [place] [address]             [phone]      4 Days Post-Op Procedure(s) (LRB): CORONARY ARTERY BYPASS GRAFTING (CABG)times four. LAD to  LIMA, SVG to PDA , Sequential SVG to OM1 and Ramus (N/A) TRANSESOPHAGEAL ECHOCARDIOGRAM (TEE) (N/A) LEFT ENDARTERECTOMY CAROTID (Left)   Subjective:  Patient has no complaints.  States he is doing pretty good.  + ambulation  + BM  Objective: Vital signs in last 24 hours: Temp:  [98.2 F (36.8 C)-99.1 F (37.3 C)] 98.5 F (36.9 C) (07/31 0350) Pulse Rate:  [73-92] 92 (07/31 0350) Cardiac Rhythm: Normal sinus rhythm (07/31 0700) Resp:  [16-26] 25 (07/31 0350) BP: (104-141)/(61-76) 132/74 (07/31 0350) SpO2:  [92 %-99 %] 98 % (07/31 0350) FiO2 (%):  [2 %] 2 % (07/30 1601) Weight:  [246 lb 12.8 oz (111.9 kg)] 246 lb 12.8 oz (111.9 kg) (07/31 0418)  Intake/Output from previous day: 07/30 0701 - 07/31 0700 In: 1560 [P.O.:1560] Out: 2025 [Urine:2025]  General appearance: alert, cooperative and no distress Heart: regular rate and rhythm Lungs: clear to auscultation bilaterally Abdomen: soft, non-tender; bowel sounds normal; no masses,  no organomegaly Extremities: edema trace Wound: clean andd ry  Lab Results:  Recent Labs  02/17/17 0419 02/18/17 0221  WBC 11.5* 9.3  HGB 9.2* 8.7*  HCT 27.8* 25.9*  PLT 163 159   BMET:  Recent Labs  02/18/17 0221 02/19/17 0220  NA 136 140  K 3.8 4.0  CL 104 106  CO2 22 26  GLUCOSE 140* 93  BUN 39* 37*  CREATININE 2.34* 2.32*  CALCIUM 8.3* 8.6*    PT/INR: No results for input(s): LABPROT, INR in the last 72 hours. ABG    Component Value Date/Time   PHART 7.379 02/15/2017 1921   HCO3 22.2 02/15/2017 1921   TCO2 21 02/16/2017 1705   ACIDBASEDEF 3.0 (H) 02/15/2017 1921   O2SAT 94.0 02/15/2017 1921   CBG (last 3)   Recent Labs  02/18/17 2056 02/18/17 2123 02/19/17 0646  GLUCAP 59* 133* 149*    Assessment/Plan: S/P Procedure(s)  (LRB): CORONARY ARTERY BYPASS GRAFTING (CABG)times four. LAD to  LIMA, SVG to PDA , Sequential SVG to OM1 and Ramus (N/A) TRANSESOPHAGEAL ECHOCARDIOGRAM (TEE) (N/A) LEFT ENDARTERECTOMY CAROTID (Left)  1. CV- NSR, overriding back up pacer- continue to hold BB for now 2. Pulm- off oxygen, continue IS 3. Renal- creatinine stable at 2.3, baseline 1.9--- continue diuretics for now 4. DM- sugars controlled 5. Expected post operative blood loss anemia, moderate Hgb at 8.7 6. Dispo- patient stable, watch HR today if remains stable, will d/c EPW tomorrow, creatinine also remains stable continue current care   LOS: 4 days    BARRETT, ERIN 02/19/2017 Patient seen and examined, agree with above He is maintaining SR with a good rate- will go ahead and dc pacing wires today  Remo Lipps C. Roxan Hockey, MD [facility] [phone]

## 2017-02-19 NOTE — Progress Notes (Signed)
Epicardial pacing wires removed as per order.  Pt tolerated well, bedrest until 1140.

## 2017-02-19 NOTE — Discharge Summary (Signed)
Physician Discharge Summary  Patient ID: Paul Mcneil MRN: [id number] DOB/AGE: November 18, 1945 71 y.o.  Admit date: 02/15/2017 Discharge date: 02/20/2017  Admission Diagnoses: Left main and 3 vessel CAD Internal carotid artery stenosis Patient Active Problem List   Diagnosis Date Noted  . CKD (chronic kidney disease) stage 3, GFR 30-59 ml/min 02/05/2017  . Diabetes ([place]) 02/01/2017  . Carotid artery disease ([place]) 02/01/2017  . Unstable angina ([place]) 01/31/2017  . Dyslipidemia, goal LDL below 70 01/31/2017  . CAD, multiple vessel 01/30/2017  . COPD GOLD II 01/11/2017  . Claudication of both lower extremities ([place]) 01/03/2017    Discharge Diagnoses:  Left main and 3 vessel CAD Internal carotid artery stenosis S/p Left carotid endarterectomy S/p CABG x 4  Discharged Condition: good  HPI:  71 yo man with a past history of anemia, arthritis, GERD, COPD (GOLD II), 100 pack year history of smoking (quit 4 months ago), and a history of ethanol use. He is a retired Engineer, structural. He has no prior history of CAD. He has noticed increasing shortness of breath with exertion over the past few weeks. He says that when he walks his legs feel like lead in the hips and he feels SOB. Symptoms resolve with rest. No rest or nocturnal symptoms.  He saw Dr. Dorthy Cooler who did a w/u including an EKG which showed inferior Q waves. A CT which showed a LUL nodule. A stress myoview showed a partially reversible basal to apical inferior defect suggestive of infarct with peri-infarct ischemia. Today he had cardiac catheterization which revealed moderate left main disease with severe disease in the RCA and the circumflex.  He is currently pain free.   Hospital Course:  Mr. [name] underwent a coronary bypass grafting 3 left carotid endarterectomy with Dr. Roxan Hockey and Dr. Scot Dock on 02/16/2017. He tolerated the procedure well and was transferred to the ICU. He was extubated in a timely manner. Postop day 1 he  remained a-paced bradycardia. We initiated a diuretic regimen for fluid overload. Postop day 2 he continued to progress. He was maintaining sinus rhythm at this time. He did experience some acute kidney injury on top of chronic kidney disease but his creatinine came down to baseline. He maintained adequate urine output. Postop day 3 we were able to change his pacer  to VVI. His blood glucose level remained controlled and Glucotrol was initiated. We continued to mobilize the patient. He was transferred to the telemetry unit for continued care. Postop day 4 he was in normal sinus rhythm and was overriding his backup pacer. We continued to hold beta-blockade for now. He did have some expected postoperative blood loss anemia which was improving. We continued to trend his creatinine level closely. POD 4 we discontinued his epicardial pacing wires without issue. His rhythm remained stable. POD 5 He is ambulating in the halls with limited assistance, tolerating room air, his incisions are healing well and he is ready for discharge.   Consults: vascular surgery  Significant Diagnostic Studies:  CLINICAL DATA:  CABG.  EXAM: CHEST  2 VIEW  COMPARISON:  04/11/2017.  02/16/2017.  FINDINGS: Interim removal of right IJ sheath. Prior CABG. Cardiomegaly with interim near complete resolution of interstitial prominence suggesting clearing CHF. Low lung volumes with basilar atelectasis. No prominent pleural effusion or pneumothorax  IMPRESSION: 1. Interim removal of right IJ sheath.  2. Prior CABG. Cardiomegaly with near complete resolution of interstitial edema. Low lung volumes with basilar atelectasis .   Electronically Signed   By: Marcello Moores  Register   On: 02/18/2017 07:15  Treatments:   NAME:  ERNAN, RUNKLES            ACCOUNT NO.:  [id number]  MEDICAL RECORD NO.:  [id number]  LOCATION:                                 FACILITY:  PHYSICIAN:  Revonda Standard. Roxan Hockey, M.D. DATE OF  BIRTH:  DATE OF PROCEDURE:  02/15/2017 DATE OF DISCHARGE:                              OPERATIVE REPORT   POSTOPERATIVE DIAGNOSES:  Left main and 3-vessel coronary artery disease and left carotid stenosis.  POSTOPERATIVE DIAGNOSES:  Left main and 3-vessel coronary artery disease and left carotid stenosis.  PROCEDURES: 1. Left carotid endarterectomy (Dr. [name]). 2. Median sternotomy. 3. Extracorporeal circulation. 4. Coronary artery bypass grafting x4 (left internal mammary artery to     LAD, saphenous vein graft to posterior descending, sequential     saphenous vein graft to ramus intermedius and obtuse marginal 2). 5. Endoscopic vein harvest, right leg.  SURGEON:  Revonda Standard. Roxan Hockey, MD.  ASSISTANT:  Nicholes Rough, PA.  ANESTHESIA:  General.  Discharge Exam: Blood pressure 133/78, pulse 77, temperature 98.4 F (36.9 C), temperature source Oral, resp. rate (!) 23, height _0  (1.854 m), weight 110.5 kg (243 lb 8 oz), SpO2 95 %.    General appearance: alert, cooperative and no distress Heart: regular rate and rhythm, S1, S2 normal, no murmur, click, rub or gallop Lungs: clear to auscultation bilaterally Abdomen: soft, non-tender; bowel sounds normal; no masses,  no organomegaly Extremities: extremities normal, atraumatic, no cyanosis or edema Wound: clean and dry  Disposition: 01-Home or Self Care   Allergies as of 02/20/2017      Reactions   Ceclor [cefaclor] Anaphylaxis, Hives, Other (See Comments)   BASED ON CRITERIA FOR ANAPHYLAXIS HIVES + HYPOTENSION      Medication List    STOP taking these medications   GARLIQUE 400 MG Tbec Generic drug:  Garlic   isosorbide mononitrate 30 MG 24 hr tablet Commonly known as:  IMDUR   Omega-3 Fish Oil 300 MG Caps     TAKE these medications   aspirin 325 MG EC tablet Take 1 tablet (325 mg total) by mouth daily. What changed:  medication strength  how much to take  when to take this    atorvastatin 40 MG tablet Commonly known as:  LIPITOR Take 1 tablet (40 mg total) by mouth at bedtime. What changed:  when to take this   bisoprolol 5 MG tablet Commonly known as:  ZEBETA Take 1 tablet (5 mg total) by mouth daily.   blood glucose meter kit and supplies Dispense based on patient and insurance preference. Use up to four times daily as directed. (FOR ICD-9 250.00, 250.01).   esomeprazole 40 MG capsule Commonly known as:  NEXIUM Take 40 mg by mouth daily as needed (for acid reflux).   glipiZIDE 5 MG tablet Commonly known as:  GLUCOTROL Take 0.5 tablets (2.5 mg total) by mouth daily before breakfast. What changed:  when to take this   RESTORA RX 60-1.25 MG Caps Generic drug:  Lactobacillus Casei-Folic Acid Take 1 tablet by mouth as needed.   traMADol 50 MG tablet Commonly known as:  ULTRAM Take 1 tablet (50 mg total) by  mouth every 6 (six) hours as needed for moderate pain.      Follow-up Information    Koirala, Dibas, MD. Call in 1 day(s).   Specialty:  Family Medicine Contact information: [place] [address] [phone]        Angelia Mould, MD. Call in 1 day(s).   Specialties:  Vascular Surgery, Cardiology Why:  Will need to follow-up in 2 weeks for a post-op appointment.  Contact information: [place] [address] [phone]        Melrose Nakayama, MD Follow up.   Specialty:  Cardiothoracic Surgery Why:  Your appointment is on 03/19/2017 at 10:00am. Please arrive at 9:30am for a chest xray located at [facility] which ison the first floor of our building.  Contact information: [address] [place]  [place] [address] [place], [place], [place]. Go on 03/11/2017.   Specialties:  Physician Assistant, Cardiology Why:  @ 3:30pm, please arrive at least 10 minutes early. Contact information: [address] [place]  [address] [phone]          The patient has been discharged on:   1.Beta Blocker:  Yes [  x ]                              No   [   ]                              If No, reason:   2.Ace Inhibitor/ARB: Yes [   ]                                     No  [  x  ]                                     If No, reason: titration of BB  3.Statin:   Yes [  x ]                  No  [   ]                  If No, reason:  4.Ecasa:  Yes  [ x  ]                  No   [   ]                  If No, reason:   Signed: Elgie Collard 02/20/2017, 8:48 AM

## 2017-02-19 NOTE — Care Management Important Message (Signed)
Important Message  Patient Details  Name: Paul Mcneil MRN: [id number] Date of Birth: [birth year]   Medicare Important Message Given:  Yes    Somya Jauregui Abena 02/19/2017, 10:01 AM

## 2017-02-19 NOTE — Discharge Instructions (Signed)
Carotid Endarterectomy, Care After This sheet gives you information about how to care for yourself after your procedure. Your health care provider may also give you more specific instructions. If you have problems or questions, contact your health care provider. What can I expect after the procedure? After the procedure, it is common to have some pain or an ache in your neck for up to 2 weeks. This is normal. Follow these instructions at home: Incision care  Follow instructions from your health care provider about how to take care of your incision. Make sure you: ? Wash your hands with soap and water before you change your bandage (dressing). If soap and water are not available, use hand sanitizer. ? Change your dressing as told by your health care provider. ? Leave stitches (sutures), skin glue, or adhesive strips in place. These skin closures may need to stay in place for 2 weeks or longer. If adhesive strip edges start to loosen and curl up, you may trim the loose edges. Do not remove adhesive strips completely unless your health care provider tells you to do that.  Check your incision area every day for signs of infection. Check for: ? Redness, swelling, or pain. ? Fluid or blood. ? Warmth. ? Pus or a bad smell.  Do not take baths, swim, or use a hot tub until your health care provider approves. Ask your health care provider if you can take showers. Medicines  Take over-the-counter and prescription medicines only as told by your health care provider.  If you get a blood thinner (anticoagulant) medicine after surgery, take it exactly as directed.  Do not drive for 24 hours if you were given a sedative.  Do not drive or use heavy machinery while taking prescription pain medicine. Activity  Do not lift anything that is heavier than 10 lb (4.5 kg), or the limit that your health care provider tells you, until he or she says that it is safe.  Exercise regularly or as told by your health  care provider.  Return to your normal activities as told by your health care provider. Ask your health care provider what activities are safe for you. ? Recovery time varies depending on your age, general health, and other factors. You will likely be able to return to a normal lifestyle within a few weeks. While you recover, you may need help with some activities such as cleaning the house or shopping. General instructions  Do not use any products that contain nicotine or tobacco, such as cigarettes and e-cigarettes. If you need help quitting, ask your health care provider.  Drink enough fluid to keep your urine clear or pale yellow.  Take steps to control your blood pressure.  Work to reach and maintain a healthy body weight.  Eat a heart-healthy diet. Talk with your health care provider about how to lower blood lipids (cholesterol and triglycerides).  Avoid wearing tight clothing around your neck and your stitches.  Keep all follow-up visits as told by your health care provider. This is important. Contact a health care provider if:  You have signs of infection, such as: ? Redness, swelling, or pain around your incision. ? Fluid or blood coming from your incision. ? Your incision feeling warm to the touch. ? Pus or a bad smell coming from your incision. ? A fever.  You develop a rash.  You have difficulty speaking or you have changes in your voice. Get help right away if:  You have difficulty breathing.  You have chest pain or shortness of breath.  You suddenly develop any of these symptoms: ? Weakness or numbness in your face, arm, or leg, especially on one side of your body. ? Confusion. ? Trouble speaking, understanding, or both. ? Trouble seeing with one or both eyes. ? A severe headache with no known cause.  You have a seizure. Summary  After the procedure, it is common to have some pain or an ache in your neck for up to 2 weeks.  Follow instructions from your  health care provider about how to take care of your incision.  Keep all follow-up visits as told by your health care provider. This is important. This information is not intended to replace advice given to you by your health care provider. Make sure you discuss any questions you have with your health care provider. Document Released: 01/26/2005 Document Revised: 05/28/2016 Document Reviewed: 05/28/2016 Elsevier Interactive Patient Education  2017 [employer] Coronary Artery Bypass Grafting, Care After This sheet gives you information about how to care for yourself after your procedure. Your health care provider may also give you more specific instructions. If you have problems or questions, contact your health care provider. What can I expect after the procedure? After the procedure, it is common to have:  Nausea and a lack of appetite.  Constipation.  Weakness and fatigue.  Depression or irritability.  Pain or discomfort in your incision areas.  Follow these instructions at home: Medicines  Take over-the-counter and prescription medicines only as told by your health care provider. Do not stop taking medicines or start any new medicines without approval from your health care provider.  If you were prescribed an antibiotic medicine, take it as told by your health care provider. Do not stop taking the antibiotic even if you start to feel better.  Do not drive or use heavy machinery while taking prescription pain medicine. Incision care  Follow instructions from your health care provider about how to take care of your incisions. Make sure you: ? Wash your hands with soap and water before you change your bandage (dressing). If soap and water are not available, use hand sanitizer. ? Change your dressing as told by your health care provider. ? Leave stitches (sutures), skin glue, or adhesive strips in place. These skin closures may need to stay in place for 2 weeks or longer. If  adhesive strip edges start to loosen and curl up, you may trim the loose edges. Do not remove adhesive strips completely unless your health care provider tells you to do that.  Keep incision areas clean, dry, and protected.  Check your incision areas every day for signs of infection. Check for: ? More redness, swelling, or pain. ? More fluid or blood. ? Warmth. ? Pus or a bad smell.  If incisions were made in your legs: ? Avoid crossing your legs. ? Avoid sitting for long periods of time. Change positions every 30 minutes. ? Raise (elevate) your legs when you are sitting. Bathing  Do not take baths, swim, or use a hot tub until your health care provider approves.  Only take sponge baths. Pat the incisions dry. Do not rub incisions with a washcloth or towel.  Ask your health care provider when you can shower. Eating and drinking  Eat foods that are high in fiber, such as raw fruits and vegetables, whole grains, beans, and nuts. Meats should be lean cut. Avoid canned, processed, and fried foods. This can help prevent constipation and  is a recommended part of a heart-healthy diet.  Drink enough fluid to keep your urine clear or pale yellow.  Limit alcohol intake to no more than 1 drink a day for nonpregnant women and 2 drinks a day for men. One drink equals 12 oz of beer, 5 oz of wine, or 1 oz of hard liquor. Activity  Rest and limit your activity as told by your health care provider. You may be instructed to: ? Stop any activity right away if you have chest pain, shortness of breath, irregular heartbeats, or dizziness. Get help right away if you have any of these symptoms. ? Move around frequently for short periods or take short walks as directed by your health care provider. Gradually increase your activities. You may need physical therapy or cardiac rehabilitation to help strengthen your muscles and build your endurance. ? Avoid lifting, pushing, or pulling anything that is heavier  than 10 lb (4.5 kg) for at least 6 weeks or as told by your health care provider.  Do not drive until your health care provider approves.  Ask your health care provider when you may return to work.  Ask your health care provider when you may resume sexual activity. General instructions  Do not use any products that contain nicotine or tobacco, such as cigarettes and e-cigarettes. If you need help quitting, ask your health care provider.  Take 2-3 deep breaths every few hours during the day, while you recover. This helps expand your lungs and prevent complications like pneumonia after surgery.  If you were given a device called an incentive spirometer, use it several times a day to practice deep breathing. Support your chest with a pillow or your arms when you take deep breaths or cough.  Wear compression stockings as told by your health care provider. These stockings help to prevent blood clots and reduce swelling in your legs.  Weigh yourself every day. This helps identify if your body is holding (retaining) fluid that may make your heart and lungs work harder.  Keep all follow-up visits as told by your health care provider. This is important. Contact a health care provider if:  You have more redness, swelling, or pain around any incision.  You have more fluid or blood coming from any incision.  Any incision feels warm to the touch.  You have pus or a bad smell coming from any incision  You have a fever.  You have swelling in your ankles or legs.  You have pain in your legs.  You gain 2 lb (0.9 kg) or more a day.  You are nauseous or you vomit.  You have diarrhea. Get help right away if:  You have chest pain that spreads to your jaw or arms.  You are short of breath.  You have a fast or irregular heartbeat.  You notice a "clicking" in your breastbone (sternum) when you move.  You have numbness or weakness in your arms or legs.  You feel dizzy or  light-headed. Summary  After the procedure, it is common to have pain or discomfort in the incision areas.  Do not take baths, swim, or use a hot tub until your health care provider approves.  Gradually increase your activities. You may need physical therapy or cardiac rehabilitation to help strengthen your muscles and build your endurance.  Weigh yourself every day. This helps identify if your body is holding (retaining) fluid that may make your heart and lungs work harder. This information is not intended  to replace advice given to you by your health care provider. Make sure you discuss any questions you have with your health care provider. Document Released: 01/26/2005 Document Revised: 05/28/2016 Document Reviewed: 05/28/2016 Elsevier Interactive Patient Education  [employer].

## 2017-02-19 NOTE — Progress Notes (Signed)
CARDIAC REHAB PHASE I   PRE:  Rate/Rhythm: 88 SR  BP:  Supine:   Sitting: 141/61  Standing:    SaO2: 95%RA  MODE:  Ambulation: 470 ft   POST:  Rate/Rhythm: 108 ST  BP:  Supine:   Sitting: 151/75  Standing:    SaO2: 92%RA 0942-1010 Pt walked 470 ft on RA with rolling walker with steady gait. Tolerated well. Pt does not want walker for home due to carpet. To bed for pacing wire removal.    Graylon Good, RN BSN  02/19/2017 10:07 AM

## 2017-02-20 LAB — TYPE AND SCREEN
ABO/RH(D): A POS
Antibody Screen: NEGATIVE
Unit division: 0
Unit division: 0

## 2017-02-20 LAB — BPAM RBC
BLOOD PRODUCT EXPIRATION DATE: 201808142359
Blood Product Expiration Date: 201808142359
ISSUE DATE / TIME: 201807271105
ISSUE DATE / TIME: 201807271105
UNIT TYPE AND RH: 6200
UNIT TYPE AND RH: 6200

## 2017-02-20 LAB — GLUCOSE, CAPILLARY
GLUCOSE-CAPILLARY: 111 mg/dL — AB (ref 65–99)
GLUCOSE-CAPILLARY: 120 mg/dL — AB (ref 65–99)

## 2017-02-20 MED ORDER — TRAMADOL HCL 50 MG PO TABS: 50.0000 mg | ORAL_TABLET | Freq: Four times a day (QID) | ORAL | 0 refills | Status: DC | PRN

## 2017-02-20 MED ORDER — ASPIRIN 325 MG PO TBEC: 325.0000 mg | DELAYED_RELEASE_TABLET | Freq: Every day | ORAL | 0 refills | Status: DC

## 2017-02-20 NOTE — Progress Notes (Signed)
CT sutures d/c'd per order and per protocol. Painted with tincture, steri-strips applied. Pt educated not to remove them as they'll fall off on their own.

## 2017-02-20 NOTE — Care Management Note (Addendum)
Case Management Note Marvetta Gibbons RN, BSN Unit 4E-Case Manager [phone]  Patient Details  Name: Paul Mcneil MRN: [id number] Date of Birth: 1945-12-15  Subjective/Objective:  Pt admitted s/p CABGx4                  Action/Plan: PTA pt lived at home- plan to return home- received notice from [place] with [facility]- they have pre-op referral for any [facility] needs. CM to follow  Expected Discharge Date:  02/20/17               Expected Discharge Plan:  Home/Self Care  In-House Referral:  NA  Discharge planning Services  CM Consult  Post Acute Care Choice:  NA Choice offered to:     DME Arranged:    DME Agency:     HH Arranged:  NA HH Agency:  [place] ([facility])  Status of Service:  Completed, signed off  If discussed at [place] of Stay Meetings, dates discussed:    Discharge Disposition: home/self care   Additional Comments:  02/20/17- 0945- Marvetta Gibbons RN, CM- pt for d/c home today- order for [facility] placed- pt has already been contacted by [facility] agency- have notified T[place] with [facility] of discharge for today.    Dawayne Patricia, RN 02/20/2017, 9:44 AM

## 2017-02-20 NOTE — Progress Notes (Signed)
Patient advised that he has felt like he can't breath but is having no problem breathing. Did not inform until he called out for Tylenol this morning. Patient is resting comfortably, possibly having some anxiety. Glucose checked and is fine at 111. Will continue to monitor. Call light within reach

## 2017-02-20 NOTE — Progress Notes (Signed)
Ed completed with pt with very good reception. He is ready to make change and has already been working on his diet at home. He quit smoking and drinking ETOH 4 months ago. He is interested in [place] and I will send his referral to [place], ACSM 10:39 AM 02/20/2017

## 2017-02-20 NOTE — Progress Notes (Addendum)
      [address]       [place] [address]             [phone]      5 Days Post-Op Procedure(s) (LRB): CORONARY ARTERY BYPASS GRAFTING (CABG)times four. LAD to  LIMA, SVG to PDA , Sequential SVG to OM1 and Ramus (N/A) TRANSESOPHAGEAL ECHOCARDIOGRAM (TEE) (N/A) LEFT ENDARTERECTOMY CAROTID (Left) Subjective: Feels okay this morning. He had a strange dream last night where he woke up and felt like he couldn't breathe. This is the first time this has happened.   Objective: Vital signs in last 24 hours: Temp:  [98.4 F (36.9 C)-98.9 F (37.2 C)] 98.4 F (36.9 C) (08/01 0417) Pulse Rate:  [77-94] 77 (07/31 1100) Cardiac Rhythm: Normal sinus rhythm (08/01 0700) Resp:  [14-23] 23 (07/31 1600) BP: (116-133)/(65-94) 133/78 (07/31 1600) SpO2:  [90 %-96 %] 95 % (07/31 1100) Weight:  [110.5 kg (243 lb 8 oz)] 110.5 kg (243 lb 8 oz) (08/01 0417)     Intake/Output from previous day: 07/31 0701 - 08/01 0700 In: -  Out: 400 [Urine:400] Intake/Output this shift: No intake/output data recorded.  General appearance: alert, cooperative and no distress Heart: regular rate and rhythm, S1, S2 normal, no murmur, click, rub or gallop Lungs: clear to auscultation bilaterally Abdomen: soft, non-tender; bowel sounds normal; no masses,  no organomegaly Extremities: extremities normal, atraumatic, no cyanosis or edema Wound: clean and dry  Lab Results:  Recent Labs  02/18/17 0221  WBC 9.3  HGB 8.7*  HCT 25.9*  PLT 159   BMET:  Recent Labs  02/18/17 0221 02/19/17 0220  NA 136 140  K 3.8 4.0  CL 104 106  CO2 22 26  GLUCOSE 140* 93  BUN 39* 37*  CREATININE 2.34* 2.32*  CALCIUM 8.3* 8.6*    PT/INR: No results for input(s): LABPROT, INR in the last 72 hours. ABG    Component Value Date/Time   PHART 7.379 02/15/2017 1921   HCO3 22.2 02/15/2017 1921   TCO2 21 02/16/2017 1705   ACIDBASEDEF 3.0 (H) 02/15/2017 1921   O2SAT 94.0 02/15/2017 1921   CBG (last 3)     Recent Labs  02/19/17 2008 02/20/17 0537 02/20/17 0621  GLUCAP 72 111* 120*    Assessment/Plan: S/P Procedure(s) (LRB): CORONARY ARTERY BYPASS GRAFTING (CABG)times four. LAD to  LIMA, SVG to PDA , Sequential SVG to OM1 and Ramus (N/A) TRANSESOPHAGEAL ECHOCARDIOGRAM (TEE) (N/A) LEFT ENDARTERECTOMY CAROTID (Left)  1. CV- NSR 60s, BP stable on Bisoprolol, ASA, and Lipitor. EPW out.  2. Pulm-tolerating room air without issue. Last CXR stable.  3. Renal-weight continued to trend down. Last creatinine 2.32. Electrolytes okay.  4. Endo-blood glucose well controlled. A1C is 8.7, will need outpatient follow-up. 5. Expected blood loss anemia-stable  Plan: watch HR closely, continue to encourage incentive spirometer. Ambulate TID. Either home later today or tomorrow morning. Ultram and Tylenol for pain.     LOS: 5 days    Elgie Collard 02/20/2017 Patient seen and examined, agree with above Dc home today  Revonda Standard. Roxan Hockey, MD [facility] [phone]

## 2017-02-21 DIAGNOSIS — E1122 Type 2 diabetes mellitus with diabetic chronic kidney disease: Secondary | ICD-10-CM | POA: Diagnosis not present

## 2017-02-21 DIAGNOSIS — N183 Chronic kidney disease, stage 3 (moderate): Secondary | ICD-10-CM | POA: Diagnosis not present

## 2017-02-21 DIAGNOSIS — J449 Chronic obstructive pulmonary disease, unspecified: Secondary | ICD-10-CM | POA: Diagnosis not present

## 2017-02-21 DIAGNOSIS — I6521 Occlusion and stenosis of right carotid artery: Secondary | ICD-10-CM | POA: Diagnosis not present

## 2017-02-21 DIAGNOSIS — Z48812 Encounter for surgical aftercare following surgery on the circulatory system: Secondary | ICD-10-CM | POA: Diagnosis not present

## 2017-02-21 DIAGNOSIS — I2510 Atherosclerotic heart disease of native coronary artery without angina pectoris: Secondary | ICD-10-CM | POA: Diagnosis not present

## 2017-02-22 DIAGNOSIS — N183 Chronic kidney disease, stage 3 (moderate): Secondary | ICD-10-CM | POA: Diagnosis not present

## 2017-02-22 DIAGNOSIS — J449 Chronic obstructive pulmonary disease, unspecified: Secondary | ICD-10-CM | POA: Diagnosis not present

## 2017-02-22 DIAGNOSIS — I6521 Occlusion and stenosis of right carotid artery: Secondary | ICD-10-CM | POA: Diagnosis not present

## 2017-02-22 DIAGNOSIS — I2510 Atherosclerotic heart disease of native coronary artery without angina pectoris: Secondary | ICD-10-CM | POA: Diagnosis not present

## 2017-02-22 DIAGNOSIS — E1122 Type 2 diabetes mellitus with diabetic chronic kidney disease: Secondary | ICD-10-CM | POA: Diagnosis not present

## 2017-02-22 DIAGNOSIS — Z48812 Encounter for surgical aftercare following surgery on the circulatory system: Secondary | ICD-10-CM | POA: Diagnosis not present

## 2017-02-23 DIAGNOSIS — Z48812 Encounter for surgical aftercare following surgery on the circulatory system: Secondary | ICD-10-CM | POA: Diagnosis not present

## 2017-02-23 DIAGNOSIS — E1122 Type 2 diabetes mellitus with diabetic chronic kidney disease: Secondary | ICD-10-CM | POA: Diagnosis not present

## 2017-02-23 DIAGNOSIS — N183 Chronic kidney disease, stage 3 (moderate): Secondary | ICD-10-CM | POA: Diagnosis not present

## 2017-02-23 DIAGNOSIS — J449 Chronic obstructive pulmonary disease, unspecified: Secondary | ICD-10-CM | POA: Diagnosis not present

## 2017-02-23 DIAGNOSIS — I6521 Occlusion and stenosis of right carotid artery: Secondary | ICD-10-CM | POA: Diagnosis not present

## 2017-02-23 DIAGNOSIS — I2510 Atherosclerotic heart disease of native coronary artery without angina pectoris: Secondary | ICD-10-CM | POA: Diagnosis not present

## 2017-02-25 DIAGNOSIS — I2510 Atherosclerotic heart disease of native coronary artery without angina pectoris: Secondary | ICD-10-CM | POA: Diagnosis not present

## 2017-02-25 DIAGNOSIS — I6521 Occlusion and stenosis of right carotid artery: Secondary | ICD-10-CM | POA: Diagnosis not present

## 2017-02-25 DIAGNOSIS — E1122 Type 2 diabetes mellitus with diabetic chronic kidney disease: Secondary | ICD-10-CM | POA: Diagnosis not present

## 2017-02-25 DIAGNOSIS — N183 Chronic kidney disease, stage 3 (moderate): Secondary | ICD-10-CM | POA: Diagnosis not present

## 2017-02-25 DIAGNOSIS — J449 Chronic obstructive pulmonary disease, unspecified: Secondary | ICD-10-CM | POA: Diagnosis not present

## 2017-02-25 DIAGNOSIS — Z48812 Encounter for surgical aftercare following surgery on the circulatory system: Secondary | ICD-10-CM | POA: Diagnosis not present

## 2017-02-26 DIAGNOSIS — Z48812 Encounter for surgical aftercare following surgery on the circulatory system: Secondary | ICD-10-CM | POA: Diagnosis not present

## 2017-02-26 DIAGNOSIS — I2510 Atherosclerotic heart disease of native coronary artery without angina pectoris: Secondary | ICD-10-CM | POA: Diagnosis not present

## 2017-02-26 DIAGNOSIS — N183 Chronic kidney disease, stage 3 (moderate): Secondary | ICD-10-CM | POA: Diagnosis not present

## 2017-02-26 DIAGNOSIS — J449 Chronic obstructive pulmonary disease, unspecified: Secondary | ICD-10-CM | POA: Diagnosis not present

## 2017-02-26 DIAGNOSIS — I6521 Occlusion and stenosis of right carotid artery: Secondary | ICD-10-CM | POA: Diagnosis not present

## 2017-02-26 DIAGNOSIS — E1122 Type 2 diabetes mellitus with diabetic chronic kidney disease: Secondary | ICD-10-CM | POA: Diagnosis not present

## 2017-02-27 DIAGNOSIS — J449 Chronic obstructive pulmonary disease, unspecified: Secondary | ICD-10-CM | POA: Diagnosis not present

## 2017-02-27 DIAGNOSIS — N183 Chronic kidney disease, stage 3 (moderate): Secondary | ICD-10-CM | POA: Diagnosis not present

## 2017-02-27 DIAGNOSIS — E1122 Type 2 diabetes mellitus with diabetic chronic kidney disease: Secondary | ICD-10-CM | POA: Diagnosis not present

## 2017-02-27 DIAGNOSIS — I2510 Atherosclerotic heart disease of native coronary artery without angina pectoris: Secondary | ICD-10-CM | POA: Diagnosis not present

## 2017-02-27 DIAGNOSIS — Z48812 Encounter for surgical aftercare following surgery on the circulatory system: Secondary | ICD-10-CM | POA: Diagnosis not present

## 2017-02-27 DIAGNOSIS — I6521 Occlusion and stenosis of right carotid artery: Secondary | ICD-10-CM | POA: Diagnosis not present

## 2017-02-28 DIAGNOSIS — E1122 Type 2 diabetes mellitus with diabetic chronic kidney disease: Secondary | ICD-10-CM | POA: Diagnosis not present

## 2017-02-28 DIAGNOSIS — N183 Chronic kidney disease, stage 3 (moderate): Secondary | ICD-10-CM | POA: Diagnosis not present

## 2017-02-28 DIAGNOSIS — I2510 Atherosclerotic heart disease of native coronary artery without angina pectoris: Secondary | ICD-10-CM | POA: Diagnosis not present

## 2017-02-28 DIAGNOSIS — I6521 Occlusion and stenosis of right carotid artery: Secondary | ICD-10-CM | POA: Diagnosis not present

## 2017-02-28 DIAGNOSIS — Z48812 Encounter for surgical aftercare following surgery on the circulatory system: Secondary | ICD-10-CM | POA: Diagnosis not present

## 2017-02-28 DIAGNOSIS — J449 Chronic obstructive pulmonary disease, unspecified: Secondary | ICD-10-CM | POA: Diagnosis not present

## 2017-03-01 ENCOUNTER — Telehealth: Payer: Self-pay | Admitting: Vascular Surgery

## 2017-03-01 DIAGNOSIS — Z79899 Other long term (current) drug therapy: Secondary | ICD-10-CM | POA: Diagnosis not present

## 2017-03-01 DIAGNOSIS — E782 Mixed hyperlipidemia: Secondary | ICD-10-CM | POA: Diagnosis not present

## 2017-03-01 DIAGNOSIS — E119 Type 2 diabetes mellitus without complications: Secondary | ICD-10-CM | POA: Diagnosis not present

## 2017-03-01 DIAGNOSIS — I779 Disorder of arteries and arterioles, unspecified: Secondary | ICD-10-CM | POA: Diagnosis not present

## 2017-03-01 DIAGNOSIS — I2510 Atherosclerotic heart disease of native coronary artery without angina pectoris: Secondary | ICD-10-CM | POA: Diagnosis not present

## 2017-03-01 DIAGNOSIS — J439 Emphysema, unspecified: Secondary | ICD-10-CM | POA: Diagnosis not present

## 2017-03-01 NOTE — Telephone Encounter (Signed)
Paul Mcneil called at 4:05 pm to say that the patient had canceled a nursing visit for today.  He said that he had 2 already this week and didn't feel he needed another one.

## 2017-03-05 DIAGNOSIS — E1122 Type 2 diabetes mellitus with diabetic chronic kidney disease: Secondary | ICD-10-CM | POA: Diagnosis not present

## 2017-03-05 DIAGNOSIS — N183 Chronic kidney disease, stage 3 (moderate): Secondary | ICD-10-CM | POA: Diagnosis not present

## 2017-03-05 DIAGNOSIS — Z48812 Encounter for surgical aftercare following surgery on the circulatory system: Secondary | ICD-10-CM | POA: Diagnosis not present

## 2017-03-05 DIAGNOSIS — I6521 Occlusion and stenosis of right carotid artery: Secondary | ICD-10-CM | POA: Diagnosis not present

## 2017-03-05 DIAGNOSIS — J449 Chronic obstructive pulmonary disease, unspecified: Secondary | ICD-10-CM | POA: Diagnosis not present

## 2017-03-05 DIAGNOSIS — I2510 Atherosclerotic heart disease of native coronary artery without angina pectoris: Secondary | ICD-10-CM | POA: Diagnosis not present

## 2017-03-07 DIAGNOSIS — I2510 Atherosclerotic heart disease of native coronary artery without angina pectoris: Secondary | ICD-10-CM | POA: Diagnosis not present

## 2017-03-07 DIAGNOSIS — E1122 Type 2 diabetes mellitus with diabetic chronic kidney disease: Secondary | ICD-10-CM | POA: Diagnosis not present

## 2017-03-07 DIAGNOSIS — J449 Chronic obstructive pulmonary disease, unspecified: Secondary | ICD-10-CM | POA: Diagnosis not present

## 2017-03-07 DIAGNOSIS — I6521 Occlusion and stenosis of right carotid artery: Secondary | ICD-10-CM | POA: Diagnosis not present

## 2017-03-07 DIAGNOSIS — Z48812 Encounter for surgical aftercare following surgery on the circulatory system: Secondary | ICD-10-CM | POA: Diagnosis not present

## 2017-03-07 DIAGNOSIS — N183 Chronic kidney disease, stage 3 (moderate): Secondary | ICD-10-CM | POA: Diagnosis not present

## 2017-03-08 ENCOUNTER — Other Ambulatory Visit: Payer: Self-pay

## 2017-03-08 DIAGNOSIS — G8918 Other acute postprocedural pain: Secondary | ICD-10-CM

## 2017-03-08 MED ORDER — TRAMADOL HCL 50 MG PO TABS: 50.0000 mg | ORAL_TABLET | Freq: Four times a day (QID) | ORAL | 0 refills | Status: DC | PRN

## 2017-03-10 NOTE — Progress Notes (Signed)
Cardiology Office Note    Date:  03/11/2017   ID:  Paul, Mcneil January 21, 1946, MRN [id number]  PCP:  [name], MD  Cardiologist: Dr. [name]   Chief Complaint  Patient presents with  . Follow-up    s/p CABG    History of Present Illness:    Paul Mcneil is a 71 y.o. male with past medical history of COPD, HTN, HLD, and Stage 3 CKD who presents to the office today for hospital follow-up.   He was evaluated in the office by Paul Ransom, PA-C in 01/2017 and reported worsening dyspnea on exertion and chest pain. A recent NST showed an inferior defect with peri-infarct ischemia, therefore a cardiac catheterization was recommended. This was performed on 01/30/2017 and showed significant multivessel CAD with 50-60% distal left main stenosis 70% ostial and 80% proximal ramus intermediate stenoses, 90% eccentric proximal circumflex stenoses with 80 and 70% distal stenoses and 80% proximal, 90% proximal to mid and total occlusion of the mid RCA with left-to-right collaterals. CT Surgery was consulted along with Vascular Surgery and he was felt to be appropriate for CABG with CEA at that time. He was discharged home on 02/01/2017 and returned for CABG on 02/15/2017. He underwent LIMA-LAD, SVG-PDA, and SVG-Ramus-OM1 with L CEA performed at that time as well. His post-operative course was uncomplicated and no arrhythmias were noted. Creatinine remained stable and was at 2.32 at the time of discharge with a Hgb of 8.7. He was discharged on ASA 370m daily, Atorvastatin 447mdaily, and Zebeta 47m32maily.    In talking with the patient today, he denies any exertional chest pain. Is still experiencing pain along his sternum which is most notable at night and worse with coughing or deep breathing. No drainage along his surgical incisions. He was initially taking a 3247m81mA but reduced this to 81mg56mly due to it causing worsening indigestion. He has an Rx for Nexium but has not been taking  this regularly. He started taking Aleve PM around the same time his heartburn was exacerbated.   He has been able to walk around his home without difficulty but has not ventured to walking long distances. He shows interest in starting cardiac rehab. He has not checked his BP at home but it is well-controlled at 122/60 during today's visit. Reports labs were recently checked by his PCP and stable at that time.   He quit smoking 6 months ago and quit using smokeless tobacco one month ago. Congratulated on this with continued cessation advised.    Past Medical History:  Diagnosis Date  . Anemia 1964  . Arthritis    "touch starting to show up in the joints" (12/11/2013)  . CAD (coronary artery disease), native coronary artery    a. Cath 01/30/17 showing multivessel CAD --> underwent CABG on 02/15/2017 with LIMA-LAD, SVG-PDA, and SVG-Ramus-OM1.   . Carotid artery calcification    a. 01/2017: s/p L CEA performed at time of CABG  . COPD (chronic obstructive pulmonary disease) (HCC) [place] Diabetes (HCC) [place] Dyspnea    with exertion  . Former heavy tobacco smoker   . GERD (gastroesophageal reflux disease)    Dr. SchooMichail Sermonistory of alcohol abuse   . Hyperlipemia     Past Surgical History:  Procedure Laterality Date  . APPENDECTOMY  12/11/2013   "laparoscopic"  . BRONCHOSCOPY    . CARDIAC CATHETERIZATION    . CHOLECYSTECTOMY  ~ 2010  . CORONARY ARTERY  BYPASS GRAFT N/A 02/15/2017   Procedure: CORONARY ARTERY BYPASS GRAFTING (CABG)times four. LAD to  LIMA, SVG to PDA , Sequential SVG to OM1 and Ramus;  Surgeon: Paul Nakayama, MD;  Location: [facility];  Service: Open Heart Surgery;  Laterality: N/A;  . ENDARTERECTOMY Left 02/15/2017   Procedure: LEFT ENDARTERECTOMY CAROTID;  Surgeon: Angelia Mould, MD;  Location: [facility];  Service: Vascular;  Laterality: Left;  . LAPAROSCOPIC APPENDECTOMY N/A 12/11/2013   Procedure: APPENDECTOMY LAPAROSCOPIC;  Surgeon: Paul Burn. Georgette Dover, MD;  Location:  [facility];  Service: General;  Laterality: N/A;  . RIGHT/LEFT HEART CATH AND CORONARY ANGIOGRAPHY N/A 01/30/2017   Procedure: Right/Left Heart Cath and Coronary Angiography;  Surgeon: Paul Sine, MD;  Location: [facility];  Service: Cardiovascular;  Laterality: N/A;  . TEE WITHOUT CARDIOVERSION N/A 02/15/2017   Procedure: TRANSESOPHAGEAL ECHOCARDIOGRAM (TEE);  Surgeon: Paul Nakayama, MD;  Location: [facility];  Service: Open Heart Surgery;  Laterality: N/A;  . TONSILLECTOMY AND ADENOIDECTOMY  1950's    Current Medications: Outpatient Medications Prior to Visit  Medication Sig Dispense Refill  . aspirin EC 325 MG EC tablet Take 1 tablet (325 mg total) by mouth daily. (Patient taking differently: Take 325 mg by mouth every other day. ) 30 tablet 0  . blood glucose meter kit and supplies Dispense based on patient and insurance preference. Use up to four times daily as directed. (FOR ICD-9 250.00, 250.01). 1 each 0  . glipiZIDE (GLUCOTROL) 5 MG tablet Take 0.5 tablets (2.5 mg total) by mouth daily before breakfast. (Patient taking differently: Take 2.5 mg by mouth daily at 3 pm. ) 30 tablet 2  . Lactobacillus Casei-Folic Acid (RESTORA RX) 60-1.25 MG CAPS Take 1 tablet by mouth as needed.    Marland Kitchen atorvastatin (LIPITOR) 40 MG tablet Take 1 tablet (40 mg total) by mouth at bedtime. (Patient taking differently: Take 40 mg by mouth daily at 3 pm. ) 90 tablet 0  . bisoprolol (ZEBETA) 5 MG tablet Take 1 tablet (5 mg total) by mouth daily. 30 tablet 3  . esomeprazole (NEXIUM) 40 MG capsule Take 40 mg by mouth daily as needed (for acid reflux).    . traMADol (ULTRAM) 50 MG tablet Take 1 tablet (50 mg total) by mouth every 6 (six) hours as needed for moderate pain. (Patient not taking: Reported on 03/11/2017) 30 tablet 0   No facility-administered medications prior to visit.      Allergies:   Ceclor [cefaclor]   Social History   Social History  . Marital status: Divorced    Spouse name: N/A  .  Number of children: N/A  . Years of education: N/A   Occupational History  . 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 Smoking status: Former Smoker    Packs/day: 2.00    Years: 52.00    Types: Cigarettes    Quit date: 09/21/2016  . Smokeless tobacco: Never Used  . Alcohol use No  . Drug use: No  . Sexual activity: No   Other Topics Concern  . None   Social History Narrative   He is a former Engineer, structural from [place] who moved down to [place] in 2009 to be close to he daughter & her family.     He lives by himself, having been divorced. He has only the one daughter and 2 grandchildren.   Does not routinely exercise.  He quit smoking along with alcohol - March 2018     Family History:  The patient's family history includes Cancer in his father; Rheum arthritis in his mother.   Review of Systems:   Please see the history of present illness.     General:  No chills, fever, night sweats or weight changes.  Cardiovascular:  No dyspnea on exertion, edema, orthopnea, palpitations, paroxysmal nocturnal dyspnea. Positive for sternal chest pain.  Dermatological: No rash, lesions/masses Respiratory: No cough, dyspnea Urologic: No hematuria, dysuria Abdominal:   No nausea, vomiting, diarrhea, bright red blood per rectum, melena, or hematemesis Neurologic:  No visual changes, wkns, changes in mental status. All other systems reviewed and are otherwise negative except as noted above.   Physical Exam:    VS:  BP 122/60   Pulse 74   Ht '6\' 1"'$  (1.854 m)   Wt 223 lb (101.2 kg)   BMI 29.42 kg/m    General: Well developed, well nourished Caucasian male appearing in no acute distress. Head: Normocephalic, atraumatic, sclera non-icteric, no xanthomas, nares are without discharge.  Neck: No carotid bruits. JVD not elevated.  Lungs: Respirations regular and unlabored, without wheezes or rales.  Heart: Regular rate and rhythm. No S3 or S4.   No murmur, no rubs, or gallops appreciated. Sternal incision appears well healing with mild erythema. No drainage or swelling noted.  Abdomen: Soft, non-tender, non-distended with normoactive bowel sounds. No hepatomegaly. No rebound/guarding. No obvious abdominal masses. Msk:  Strength and tone appear normal for age. No joint deformities or effusions. Extremities: No clubbing or cyanosis. No edema.  Distal pedal pulses are 2+ bilaterally. Vein graft harvest sites are well-healing.  Neuro: Alert and oriented X 3. Moves all extremities spontaneously. No focal deficits noted. Psych:  Responds to questions appropriately with a normal affect. Skin: No rashes or lesions noted  Wt Readings from Last 3 Encounters:  03/11/17 223 lb (101.2 kg)  02/20/17 243 lb 8 oz (110.5 kg)  02/13/17 242 lb 9.6 oz (110 kg)     Studies/Labs Reviewed:   EKG:  EKG is ordered today.  The ekg ordered today demonstrates NSR, HR 74, with lateral TWI.   Recent Labs: 01/10/2017: Pro B Natriuretic peptide (BNP) 58.0; TSH 2.29 02/13/2017: ALT 32 02/16/2017: Magnesium 1.9 02/18/2017: Hemoglobin 8.7; Platelets 159 02/19/2017: BUN 37; Creatinine, Ser 2.32; Potassium 4.0; Sodium 140   Lipid Panel No results found for: CHOL, TRIG, HDL, CHOLHDL, VLDL, LDLCALC, LDLDIRECT  Additional studies/ records that were reviewed today include:   Cardiac Catheterization: 01/30/2017  Ost RCA lesion, 80 %stenosed.  Mid RCA-2 lesion, 100 %stenosed.  Mid RCA-1 lesion, 90 %stenosed.  Ost LM lesion, 60 %stenosed.  Ost Ramus lesion, 70 %stenosed.  Ramus lesion, 80 %stenosed.  Prox Cx lesion, 90 %stenosed.  Mid Cx lesion, 80 %stenosed.  3rd Mrg lesion, 70 %stenosed.  Prox LAD to Mid LAD lesion, 40 %stenosed.  Mid LAD lesion, 40 %stenosed.   Normal right heart pressures.  Significant multivessel CAD with  50-60% smooth distal left main stenosis with the percent proximal mid LAD stenoses; 70% ostial and 80% proximal ramus  intermediate stenoses; 90% eccentric proximal circumflex stenoses with 80 and 70% distal stenoses and 80% proximal, 90% proximal to mid and total occlusion of the mid RCA with left-to-right collaterals.  LVEDP 19 mmHg.  Left ventriculography was not done due to the patient's renal insufficiency.  RECOMMENDATION: The patient will be hydrated overnight.  He will be started on IV heparin.  Surgical  consultation will be obtained for CABG revascularization surgery.  Angiographic findings were reviewed with Dr. Glenetta Hew.  Echocardiogram: 01/2017 Study Conclusions  - Left ventricle: Mild aneurysmal dilitation of inferior base. The   cavity size was normal. Wall thickness was increased in a pattern   of mild LVH. Systolic function was normal. The estimated ejection   fraction was in the range of 60% to 65%. Doppler parameters are   consistent with abnormal left ventricular relaxation (grade 1   diastolic dysfunction). - Mitral valve: There was mild regurgitation.   Assessment:    1. Coronary artery disease involving native coronary artery of native heart without angina pectoris   2. Post-op pain   3. Bilateral carotid artery disease ([place])   4. Dyslipidemia, goal LDL below 70   5. Essential hypertension   6. CKD (chronic kidney disease) stage 3, GFR 30-59 ml/min   7. Type 2 diabetes mellitus without complication, without long-term current use of insulin ([place])      Plan:   In order of problems listed above:  1. CAD - recent cardiac catheterization showed multivessel CAD and he was referred for CABG. This was performed on 02/15/2017 with LIMA-LAD, SVG-PDA, and SVG-Ramus-OM1. - he denies any exertional chest pain. Does have incisional pain which is worse with positional changes or coughing. He has reduced his ASA dose from 328m daily to 874mdaily due to acid reflux. I reviewed with him the importance of staying on 32575maily dosing at this time with him being only less than one  month out from CABG. Therefore, he will restart ASA 325 mg daily and take this along with Nexium 70m45mily. I advised him to avoid taking NSAIDS while on full dose ASA and use Tylenol if needed. Will provide a refill of his Tramadol 50mg17m with only #15 and no refills. - continue BB and statin therapy.   2. Carotid Artery Stenosis - s/p L CEA performed at the time of CABG.  - continue ASA and statin therapy.   3. HLD - no Lipid Panel available for review in Epic. Currently on Lipitor 70mg 62my.  - will need repeat FLP/LFT's in 4 weeks. Goal LDL is < 70 with known CAD.   4. HTN - BP is well-controlled at 122/60 during today's visit. - continue current medication regimen.   5. Stage 3 CKD - creatinine at 2.32 at the time of hospital discharge. Recent labwork checked by PCP. Will try to obtain these records.   6. Type 2 DM  - Hgb A1c elevated at 8.7 during recent admission. Started on Glipizide.  - recommend continued follow-up with PCP.   Medication Adjustments/Labs and Tests Ordered: Current medicines are reviewed at length with the patient today.  Concerns regarding medicines are outlined above.  Medication changes, Labs and Tests ordered today are listed in the Patient Instructions below. Patient Instructions  Medication Instructions:  STOP TAKING ALEVE AND TAKE TYLENOL AS NEEDED. RESUME ASPIRIN 325mg D12m If you need a refill on your cardiac medications before your next appointment, please call your pharmacy.  Follow-Up: Your physician wants you to follow-up in: 3 MONTHS WITH DR HARDING.    Thank you for choosing [facility] at [facility] 03/11/2017 7:52 PM    [facility] [address] [id number] [phone] [id number] [phone]

## 2017-03-11 ENCOUNTER — Ambulatory Visit (INDEPENDENT_AMBULATORY_CARE_PROVIDER_SITE_OTHER): Payer: Medicare Other | Admitting: Student

## 2017-03-11 ENCOUNTER — Other Ambulatory Visit: Payer: Self-pay | Admitting: Thoracic Surgery (Cardiothoracic Vascular Surgery)

## 2017-03-11 ENCOUNTER — Encounter: Payer: Self-pay | Admitting: Student

## 2017-03-11 VITALS — BP 122/60 | HR 74 | Ht 73.0 in | Wt 223.0 lb

## 2017-03-11 DIAGNOSIS — I2510 Atherosclerotic heart disease of native coronary artery without angina pectoris: Secondary | ICD-10-CM | POA: Diagnosis not present

## 2017-03-11 DIAGNOSIS — E785 Hyperlipidemia, unspecified: Secondary | ICD-10-CM | POA: Diagnosis not present

## 2017-03-11 DIAGNOSIS — N183 Chronic kidney disease, stage 3 unspecified: Secondary | ICD-10-CM

## 2017-03-11 DIAGNOSIS — G8918 Other acute postprocedural pain: Secondary | ICD-10-CM | POA: Diagnosis not present

## 2017-03-11 DIAGNOSIS — Z951 Presence of aortocoronary bypass graft: Secondary | ICD-10-CM

## 2017-03-11 DIAGNOSIS — I779 Disorder of arteries and arterioles, unspecified: Secondary | ICD-10-CM

## 2017-03-11 DIAGNOSIS — E119 Type 2 diabetes mellitus without complications: Secondary | ICD-10-CM

## 2017-03-11 DIAGNOSIS — I10 Essential (primary) hypertension: Secondary | ICD-10-CM

## 2017-03-11 DIAGNOSIS — I739 Peripheral vascular disease, unspecified: Secondary | ICD-10-CM

## 2017-03-11 MED ORDER — ATORVASTATIN CALCIUM 40 MG PO TABS: 40.0000 mg | ORAL_TABLET | Freq: Every day | ORAL | 3 refills | Status: DC

## 2017-03-11 MED ORDER — TRAMADOL HCL 50 MG PO TABS: 50.0000 mg | ORAL_TABLET | Freq: Four times a day (QID) | ORAL | 0 refills | Status: DC | PRN

## 2017-03-11 MED ORDER — BISOPROLOL FUMARATE 5 MG PO TABS
5.0000 mg | ORAL_TABLET | Freq: Every day | ORAL | 3 refills | Status: DC
Start: 2017-03-11 — End: 2017-10-18

## 2017-03-11 MED ORDER — ESOMEPRAZOLE MAGNESIUM 40 MG PO CPDR: 40.0000 mg | DELAYED_RELEASE_CAPSULE | Freq: Every day | ORAL | 3 refills | Status: AC | PRN

## 2017-03-11 NOTE — Patient Instructions (Signed)
Medication Instructions:  STOP TAKING ALEVE AND TAKE TYLENOL If you need a refill on your cardiac medications before your next appointment, please call your pharmacy.  Follow-Up: Your physician wants you to follow-up in: 3 MONTHS WITH DR HARDING.    Thank you for choosing [facility] at [facility]!!

## 2017-03-12 ENCOUNTER — Encounter: Payer: Self-pay | Admitting: Thoracic Surgery (Cardiothoracic Vascular Surgery)

## 2017-03-12 ENCOUNTER — Ambulatory Visit
Admission: RE | Admit: 2017-03-12 | Discharge: 2017-03-12 | Disposition: A | Discharge status: Home / Self Care | Payer: Medicare Other | Source: Ambulatory Visit | Attending: Thoracic Surgery (Cardiothoracic Vascular Surgery) | Admitting: Thoracic Surgery (Cardiothoracic Vascular Surgery)

## 2017-03-12 ENCOUNTER — Ambulatory Visit (INDEPENDENT_AMBULATORY_CARE_PROVIDER_SITE_OTHER): Payer: Self-pay | Admitting: Thoracic Surgery (Cardiothoracic Vascular Surgery)

## 2017-03-12 VITALS — BP 127/71 | HR 63 | Resp 20 | Ht 73.0 in | Wt 224.0 lb

## 2017-03-12 DIAGNOSIS — Z951 Presence of aortocoronary bypass graft: Secondary | ICD-10-CM

## 2017-03-12 DIAGNOSIS — J9811 Atelectasis: Secondary | ICD-10-CM | POA: Diagnosis not present

## 2017-03-12 MED ORDER — ASPIRIN EC 81 MG PO TBEC: 81.0000 mg | DELAYED_RELEASE_TABLET | Freq: Every day | ORAL | 6 refills | Status: DC

## 2017-03-12 NOTE — Progress Notes (Signed)
[address]       [place] [address]             [phone]    HPI: [name] returns for a scheduled follow-up visit  [name] is a 71 year old man with past medical history significant for tobacco abuse, COPD, GERD, anemia, arthritis, history of ethanol use. He presented with shortness of breath with exertion. An EKG showed inferior Q waves. 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 A cardiac catheterization he had moderate left main disease and severe disease in the right coronary and circumflex. He was advised to have coronary bypass grafting. His preoperative carotid Doppler showed a critical lesion in the left. He underwent a combined left carotid endarterectomy (Dr. Scot Dock) and coronary bypass grafting 4 on 02/15/2017. His postoperative course was unremarkable and he went home on day 5.  He complains of some incisional discomfort particularly when he first lies down at night and he changes position in bed. He had stopped taking his pain medication altogether because of constipation, but now is using a half a tablet a couple of times a day for pain. He's had some stomach upset with the 325 mg aspirin tablets has been taking for 81 mg tablets, 2 in the morning and 2 at night. He still tires easily but thinks he is starting to get better.  Past Medical History:  Diagnosis Date  . Anemia 1964  . Arthritis    "touch starting to show up in the joints" (12/11/2013)  . CAD (coronary artery disease), native coronary artery    a. Cath 01/30/17 showing multivessel CAD --> underwent CABG on 02/15/2017 with LIMA-LAD, SVG-PDA, and SVG-Ramus-OM1.   . Carotid artery calcification    a. 01/2017: s/p L CEA performed at time of CABG  . COPD (chronic obstructive pulmonary disease) ([place])   . Diabetes ([place])   . Dyspnea    with exertion  . Former heavy tobacco smoker   . GERD (gastroesophageal reflux disease)    Dr. Michail Sermon  . History of alcohol abuse     . Hyperlipemia     Current Outpatient Prescriptions  Medication Sig Dispense Refill  . aspirin 81 MG tablet Take 1 tablet (81 mg total) by mouth daily. 30 tablet 6  . atorvastatin (LIPITOR) 40 MG tablet Take 1 tablet (40 mg total) by mouth at bedtime. 90 tablet 3  . bisoprolol (ZEBETA) 5 MG tablet Take 1 tablet (5 mg total) by mouth daily. 90 tablet 3  . blood glucose meter kit and supplies Dispense based on patient and insurance preference. Use up to four times daily as directed. (FOR ICD-9 250.00, 250.01). 1 each 0  . esomeprazole (NEXIUM) 40 MG capsule Take 1 capsule (40 mg total) by mouth daily as needed (for acid reflux). 90 capsule 3  . glipiZIDE (GLUCOTROL) 5 MG tablet Take 0.5 tablets (2.5 mg total) by mouth daily before breakfast. (Patient taking differently: Take 2.5 mg by mouth daily at 3 pm. ) 30 tablet 2  . Lactobacillus Casei-Folic Acid (RESTORA RX) 60-1.25 MG CAPS Take 1 tablet by mouth as needed.    . traMADol (ULTRAM) 50 MG tablet Take 1 tablet (50 mg total) by mouth every 6 (six) hours as needed for moderate pain. 15 tablet 0   No current facility-administered medications for this visit.     Physical Exam BP 127/71   Pulse 63   Resp 20   Ht '6\' 1"'$  (3.825 m)  Wt 224 lb (101.6 kg)   SpO2 97%   BMI 29.58 kg/m  71 year old man in no acute distress Alert and oriented 3 with no focal deficits Neck incision well-healed Sternal incision clean dry and intact sternum stable Cardiac regular rate and rhythm normal S1 and S2 Lungs clear with equal breath sounds bilaterally Some eschar on the incision at the knee, trace edema right leg  Diagnostic Tests: CHEST  2 VIEW  COMPARISON:  02/18/2017.  FINDINGS: Prior CABG. Heart size stable. No pulmonary venous congestion. No focal infiltrate. Interim resolution of pulmonary edema . Mild basilar subsegmental atelectasis.  IMPRESSION: Prior CABG. Interim resolution of pulmonary edema. Mild bibasilar subsegmental  atelectasis .   Electronically Signed   By: Marcello Moores  Register   On: 03/12/2017 16:23 I personally reviewed the chest x-ray images and concur with the findings noted above  Impression: [name] is a 71 year old gentleman with multiple cardiac risk factors who presented with exertional dyspnea. He was found to have left main and three-vessel coronary disease. He underwent combined left carotid endarterectomy and coronary bypass grafting 4 on 02/15/2017.  He currently is doing well. He's in the third week postop which tends to be a time when people feel very fatigued and a little depressed. He seems to be starting to see some improvement from that. I suspect he will see a dramatic improvement over the next 3-4 weeks.  I advised him to wait at least another week before trying to drive and he should not drive a 70 take any narcotics for pain. Once he begins driving he should limit himself to short trips such as a grocery store. He should avoid busy areas and high speeds for least another month.  He should not lift anything over 10 pounds for another 3 weeks.  He has a follow-up appointment with Dr. Scot Dock in September.  Plan: I'll plan to see him back in 3 weeks to check on his progress.  Melrose Nakayama, MD [facility] [phone]

## 2017-03-15 ENCOUNTER — Telehealth (HOSPITAL_COMMUNITY): Payer: Self-pay

## 2017-03-15 ENCOUNTER — Other Ambulatory Visit: Payer: Self-pay | Admitting: *Deleted

## 2017-03-15 ENCOUNTER — Ambulatory Visit (INDEPENDENT_AMBULATORY_CARE_PROVIDER_SITE_OTHER): Payer: Self-pay | Admitting: Thoracic Surgery (Cardiothoracic Vascular Surgery)

## 2017-03-15 ENCOUNTER — Encounter: Payer: Self-pay | Admitting: Thoracic Surgery (Cardiothoracic Vascular Surgery)

## 2017-03-15 VITALS — BP 103/68 | HR 70 | Temp 98.8°F | Resp 18 | Ht 73.0 in | Wt 224.0 lb

## 2017-03-15 DIAGNOSIS — T814XXA Infection following a procedure, initial encounter: Principal | ICD-10-CM

## 2017-03-15 DIAGNOSIS — Z951 Presence of aortocoronary bypass graft: Secondary | ICD-10-CM

## 2017-03-15 DIAGNOSIS — IMO0001 Reserved for inherently not codable concepts without codable children: Secondary | ICD-10-CM

## 2017-03-15 MED ORDER — LEVOFLOXACIN 250 MG PO TABS: 500.0000 mg | ORAL_TABLET | Freq: Every day | ORAL | Status: DC

## 2017-03-15 MED ORDER — LEVOFLOXACIN 500 MG PO TABS: 500.0000 mg | ORAL_TABLET | Freq: Every day | ORAL | 0 refills | Status: AC

## 2017-03-15 NOTE — Progress Notes (Signed)
[address]       ,[place] [address]             [phone]    HPI: Mr. Cathey returns for evaluation of his sternal incision  Mr. Seeling is a 71 year old gentleman who had coronary bypass grafting 4 and a combined left carotid endarterectomy on 02/15/2017. His postoperative course was uncomplicated and he went home on day 5. I saw him in the office earlier this week for scheduled follow-up appointment. He was still having some incisional pain, but did not have any redness or drainage.  Last night he noticed a small amount of clear fluid on his T-shirt. He then was able to express additional fluid by pressing on the incision. He did not see any blood or pus. He has been afebrile. He does not lose any motion in the sternum.  Past Medical History:  Diagnosis Date  . Anemia 1964  . Arthritis    "touch starting to show up in the joints" (12/11/2013)  . CAD (coronary artery disease), native coronary artery    a. Cath 01/30/17 showing multivessel CAD --> underwent CABG on 02/15/2017 with LIMA-LAD, SVG-PDA, and SVG-Ramus-OM1.   . Carotid artery calcification    a. 01/2017: s/p L CEA performed at time of CABG  . COPD (chronic obstructive pulmonary disease) ([place])   . Diabetes ([place])   . Dyspnea    with exertion  . Former heavy tobacco smoker   . GERD (gastroesophageal reflux disease)    Dr. Michail Sermon  . History of alcohol abuse   . Hyperlipemia      Current Outpatient Prescriptions  Medication Sig Dispense Refill  . aspirin 81 MG tablet Take 1 tablet (81 mg total) by mouth daily. 30 tablet 6  . atorvastatin (LIPITOR) 40 MG tablet Take 1 tablet (40 mg total) by mouth at bedtime. 90 tablet 3  . bisoprolol (ZEBETA) 5 MG tablet Take 1 tablet (5 mg total) by mouth daily. 90 tablet 3  . blood glucose meter kit and supplies Dispense based on patient and insurance preference. Use up to four times daily as directed. (FOR ICD-9 250.00, 250.01). 1 each 0  . esomeprazole (NEXIUM)  40 MG capsule Take 1 capsule (40 mg total) by mouth daily as needed (for acid reflux). 90 capsule 3  . glipiZIDE (GLUCOTROL) 5 MG tablet Take 0.5 tablets (2.5 mg total) by mouth daily before breakfast. (Patient taking differently: Take 2.5 mg by mouth daily at 3 pm. ) 30 tablet 2  . Lactobacillus Casei-Folic Acid (RESTORA RX) 60-1.25 MG CAPS Take 1 tablet by mouth as needed.    . traMADol (ULTRAM) 50 MG tablet Take 1 tablet (50 mg total) by mouth every 6 (six) hours as needed for moderate pain. 15 tablet 0   Current Facility-Administered Medications  Medication Dose Route Frequency Provider Last Rate Last Dose  . levofloxacin (LEVAQUIN) tablet 500 mg  500 mg Oral Daily Melrose Nakayama, MD        Physical Exam BP 103/68 (BP Location: Left Arm, Patient Position: Sitting, Cuff Size: Large)   Pulse 70   Temp 98.8 F (37.1 C) (Oral)   Resp 18   Ht '6\' 1"'$  (1.854 m)   Wt 224 lb (101.6 kg)   SpO2 99% Comment: ON RA  BMI 29.20 kg/m  71 year old man in no acute distress Sternal incision with mild erythema over the sternomanubrial junction area. Unable to express any additional fluid with pressure. Numbed with  cold spray. Thinned out portion of skin opened. Probing gently with a Q-tip there was a small tract directed superiorly into the subcutaneous tissue with minimal purulent material.  Impression: Superficial sternal wound infection- I think this will respond to conservative local care and oral antibiotics. We will arrange a home health nurse for dressing changes to clean with peroxide and packed the wound with Nu Gauze on a daily basis. He has an allergy to cefaclor, so I think a quinolone is our best option. I gave him a prescription for Levaquin 500 mg by mouth daily to start today for 7 days.  I will see him back next Tuesday.  He was advised to call if he develops a fever greater than 101, increasing pain, purulent drainage, or sternal motion.   Melrose Nakayama, MD [facility] [phone]

## 2017-03-15 NOTE — Telephone Encounter (Signed)
Patient insurance is active and benefits verified. Patient insurances are Medicare A/B and UMR.  Medicare A/B - no co-payment, deductible $183.00/$183.00 has been met, no out of pocket, 20% co-insurance and no pre-authorization. Passport/reference [id number]. UMR - no co-payment, deductible $200/$200 has been met, out of pocket $7350/$85.29 has been met, no co-insurance, 18 weeks covered and no pre-authorization. Covered at 100% when deductible is met. Spoke with Meka @ UMR reference [id number].

## 2017-03-15 NOTE — Telephone Encounter (Signed)
I called and spoke to patient about scheduling for cardiac rehab. Patient stated he was interested, but he was not ready to schedule and to call him back in 2 weeks. I confirmed with patient that I will contact him in 2 weeks.

## 2017-03-16 DIAGNOSIS — T814XXD Infection following a procedure, subsequent encounter: Secondary | ICD-10-CM | POA: Diagnosis not present

## 2017-03-16 DIAGNOSIS — E1122 Type 2 diabetes mellitus with diabetic chronic kidney disease: Secondary | ICD-10-CM | POA: Diagnosis not present

## 2017-03-16 DIAGNOSIS — I2510 Atherosclerotic heart disease of native coronary artery without angina pectoris: Secondary | ICD-10-CM | POA: Diagnosis not present

## 2017-03-16 DIAGNOSIS — N183 Chronic kidney disease, stage 3 (moderate): Secondary | ICD-10-CM | POA: Diagnosis not present

## 2017-03-16 DIAGNOSIS — J449 Chronic obstructive pulmonary disease, unspecified: Secondary | ICD-10-CM | POA: Diagnosis not present

## 2017-03-16 DIAGNOSIS — Z48812 Encounter for surgical aftercare following surgery on the circulatory system: Secondary | ICD-10-CM | POA: Diagnosis not present

## 2017-03-16 DIAGNOSIS — B9689 Other specified bacterial agents as the cause of diseases classified elsewhere: Secondary | ICD-10-CM | POA: Diagnosis not present

## 2017-03-17 DIAGNOSIS — I2510 Atherosclerotic heart disease of native coronary artery without angina pectoris: Secondary | ICD-10-CM | POA: Diagnosis not present

## 2017-03-17 DIAGNOSIS — E1122 Type 2 diabetes mellitus with diabetic chronic kidney disease: Secondary | ICD-10-CM | POA: Diagnosis not present

## 2017-03-17 DIAGNOSIS — B9689 Other specified bacterial agents as the cause of diseases classified elsewhere: Secondary | ICD-10-CM | POA: Diagnosis not present

## 2017-03-17 DIAGNOSIS — T814XXD Infection following a procedure, subsequent encounter: Secondary | ICD-10-CM | POA: Diagnosis not present

## 2017-03-17 DIAGNOSIS — J449 Chronic obstructive pulmonary disease, unspecified: Secondary | ICD-10-CM | POA: Diagnosis not present

## 2017-03-17 DIAGNOSIS — N183 Chronic kidney disease, stage 3 (moderate): Secondary | ICD-10-CM | POA: Diagnosis not present

## 2017-03-18 DIAGNOSIS — T814XXD Infection following a procedure, subsequent encounter: Secondary | ICD-10-CM | POA: Diagnosis not present

## 2017-03-18 DIAGNOSIS — N183 Chronic kidney disease, stage 3 (moderate): Secondary | ICD-10-CM | POA: Diagnosis not present

## 2017-03-18 DIAGNOSIS — B9689 Other specified bacterial agents as the cause of diseases classified elsewhere: Secondary | ICD-10-CM | POA: Diagnosis not present

## 2017-03-18 DIAGNOSIS — E1122 Type 2 diabetes mellitus with diabetic chronic kidney disease: Secondary | ICD-10-CM | POA: Diagnosis not present

## 2017-03-18 DIAGNOSIS — J449 Chronic obstructive pulmonary disease, unspecified: Secondary | ICD-10-CM | POA: Diagnosis not present

## 2017-03-18 DIAGNOSIS — I2510 Atherosclerotic heart disease of native coronary artery without angina pectoris: Secondary | ICD-10-CM | POA: Diagnosis not present

## 2017-03-19 ENCOUNTER — Encounter: Payer: Self-pay | Admitting: Thoracic Surgery (Cardiothoracic Vascular Surgery)

## 2017-03-19 ENCOUNTER — Other Ambulatory Visit: Payer: Self-pay | Admitting: Thoracic Surgery (Cardiothoracic Vascular Surgery)

## 2017-03-19 ENCOUNTER — Ambulatory Visit: Payer: Medicare Other | Admitting: Thoracic Surgery (Cardiothoracic Vascular Surgery)

## 2017-03-19 ENCOUNTER — Ambulatory Visit (INDEPENDENT_AMBULATORY_CARE_PROVIDER_SITE_OTHER): Payer: Self-pay | Admitting: Thoracic Surgery (Cardiothoracic Vascular Surgery)

## 2017-03-19 VITALS — BP 95/60 | HR 83 | Temp 97.4°F | Resp 16 | Ht 73.0 in | Wt 224.0 lb

## 2017-03-19 DIAGNOSIS — T814XXA Infection following a procedure, initial encounter: Principal | ICD-10-CM

## 2017-03-19 DIAGNOSIS — Z951 Presence of aortocoronary bypass graft: Secondary | ICD-10-CM

## 2017-03-19 DIAGNOSIS — T8132XS Disruption of internal operation (surgical) wound, not elsewhere classified, sequela: Secondary | ICD-10-CM

## 2017-03-19 DIAGNOSIS — IMO0001 Reserved for inherently not codable concepts without codable children: Secondary | ICD-10-CM

## 2017-03-19 NOTE — Progress Notes (Signed)
[address]       [place] [address]             [phone]     HPI: [name] returns for follow-up of his superficial sternal wound infection.  He is a 71 year old man who underwent coronary bypass grafting 4 on 02/15/2017. Dr. Scot Mcneil did a left carotid endarterectomy at the same setting. His postoperative course was unremarkable and went home on postoperative day #5. He was seen in the office on 03/12/2017 and was making good progress. He developed drainage from the sternum a couple days later was seen back in the office on 03/15/2017. I opened a portion of the incision and probed it with a Q-tip and we have been packing with iodoform Nu Gauze since then.  He says that the skin opening has enlarged a little since his last visit. He still has pain with dressing changes. He has not had any fevers or chills. Still has pain in the upper portion of the sternum but that is unchanged.  Past Medical History:  Diagnosis Date  . Anemia 1964  . Arthritis    "touch starting to show up in the joints" (12/11/2013)  . CAD (coronary artery disease), native coronary artery    a. Cath 01/30/17 showing multivessel CAD --> underwent CABG on 02/15/2017 with LIMA-LAD, SVG-PDA, and SVG-Ramus-OM1.   . Carotid artery calcification    a. 01/2017: s/p L CEA performed at time of CABG  . COPD (chronic obstructive pulmonary disease) ([place])   . Diabetes ([place])   . Dyspnea    with exertion  . Former heavy tobacco smoker   . GERD (gastroesophageal reflux disease)    Dr. Michail Mcneil  . History of alcohol abuse   . Hyperlipemia     Current Outpatient Prescriptions  Medication Sig Dispense Refill  . aspirin 81 MG tablet Take 1 tablet (81 mg total) by mouth daily. 30 tablet 6  . atorvastatin (LIPITOR) 40 MG tablet Take 1 tablet (40 mg total) by mouth at bedtime. 90 tablet 3  . bisoprolol (ZEBETA) 5 MG tablet Take 1 tablet (5 mg total) by mouth daily. 90 tablet 3  . blood glucose meter kit and  supplies Dispense based on patient and insurance preference. Use up to four times daily as directed. (FOR ICD-9 250.00, 250.01). 1 each 0  . esomeprazole (NEXIUM) 40 MG capsule Take 1 capsule (40 mg total) by mouth daily as needed (for acid reflux). 90 capsule 3  . glipiZIDE (GLUCOTROL) 5 MG tablet Take 0.5 tablets (2.5 mg total) by mouth daily before breakfast. (Patient taking differently: Take 2.5 mg by mouth daily at 3 pm. ) 30 tablet 2  . Lactobacillus Casei-Folic Acid (RESTORA RX) 60-1.25 MG CAPS Take 1 tablet by mouth as needed.    Marland Kitchen levofloxacin (LEVAQUIN) 500 MG tablet Take 1 tablet (500 mg total) by mouth daily. 7 tablet 0  . traMADol (ULTRAM) 50 MG tablet Take 50 mg by mouth every 6 (six) hours as needed.     No current facility-administered medications for this visit.     Physical Exam BP 95/60 (BP Location: Right Arm, Patient Position: Sitting, Cuff Size: Large)   Pulse 83   Temp (!) 97.4 F (36.3 C) (Oral)   Resp 16   Ht _0  (1.854 m)   Wt 224 lb (101.6 kg)   SpO2 97% Comment: RA  BMI 29.35 kg/m  71 year old man in no acute distress Alert and oriented 3  with no focal deficits Cardiac regular rate and rhythm normal S1 and S2 Lungs clear with equal breath sounds bilaterally Sternal incision with 5 mm open area over the sternomanubrial junction minimal necrotic tissue was debrided some exudate present  Diagnostic Tests:   Impression: [name] is a 71 year old man who had coronary bypass grafting and left carotid endarterectomy on 02/15/2017. He presented back last week with a superficial sternal wound infection. He's been treated with local wound care and PO Levaquin.  Today the cavity is a little larger than it was last week. Overall the skin around the wound looks healthy. There is no evidence of deep infection. At this point think we can pack this with normal saline wet to dry 2 x 2 gauze. Would continue to clean with peroxide prior to packing for another week due to  the exudate. He should complete his course of antibiotics. We will observe him then to see if he needs any more.  Plan: Return in one week  Paul Nakayama, MD [facility] [phone]

## 2017-03-20 DIAGNOSIS — J449 Chronic obstructive pulmonary disease, unspecified: Secondary | ICD-10-CM | POA: Diagnosis not present

## 2017-03-20 DIAGNOSIS — E1122 Type 2 diabetes mellitus with diabetic chronic kidney disease: Secondary | ICD-10-CM | POA: Diagnosis not present

## 2017-03-20 DIAGNOSIS — I2510 Atherosclerotic heart disease of native coronary artery without angina pectoris: Secondary | ICD-10-CM | POA: Diagnosis not present

## 2017-03-20 DIAGNOSIS — N183 Chronic kidney disease, stage 3 (moderate): Secondary | ICD-10-CM | POA: Diagnosis not present

## 2017-03-20 DIAGNOSIS — T814XXD Infection following a procedure, subsequent encounter: Secondary | ICD-10-CM | POA: Diagnosis not present

## 2017-03-20 DIAGNOSIS — B9689 Other specified bacterial agents as the cause of diseases classified elsewhere: Secondary | ICD-10-CM | POA: Diagnosis not present

## 2017-03-21 DIAGNOSIS — J449 Chronic obstructive pulmonary disease, unspecified: Secondary | ICD-10-CM | POA: Diagnosis not present

## 2017-03-21 DIAGNOSIS — N183 Chronic kidney disease, stage 3 (moderate): Secondary | ICD-10-CM | POA: Diagnosis not present

## 2017-03-21 DIAGNOSIS — T814XXD Infection following a procedure, subsequent encounter: Secondary | ICD-10-CM | POA: Diagnosis not present

## 2017-03-21 DIAGNOSIS — I2510 Atherosclerotic heart disease of native coronary artery without angina pectoris: Secondary | ICD-10-CM | POA: Diagnosis not present

## 2017-03-21 DIAGNOSIS — E1122 Type 2 diabetes mellitus with diabetic chronic kidney disease: Secondary | ICD-10-CM | POA: Diagnosis not present

## 2017-03-21 DIAGNOSIS — B9689 Other specified bacterial agents as the cause of diseases classified elsewhere: Secondary | ICD-10-CM | POA: Diagnosis not present

## 2017-03-22 DIAGNOSIS — E1122 Type 2 diabetes mellitus with diabetic chronic kidney disease: Secondary | ICD-10-CM | POA: Diagnosis not present

## 2017-03-22 DIAGNOSIS — J449 Chronic obstructive pulmonary disease, unspecified: Secondary | ICD-10-CM | POA: Diagnosis not present

## 2017-03-22 DIAGNOSIS — I2510 Atherosclerotic heart disease of native coronary artery without angina pectoris: Secondary | ICD-10-CM | POA: Diagnosis not present

## 2017-03-22 DIAGNOSIS — B9689 Other specified bacterial agents as the cause of diseases classified elsewhere: Secondary | ICD-10-CM | POA: Diagnosis not present

## 2017-03-22 DIAGNOSIS — T814XXD Infection following a procedure, subsequent encounter: Secondary | ICD-10-CM | POA: Diagnosis not present

## 2017-03-22 DIAGNOSIS — N183 Chronic kidney disease, stage 3 (moderate): Secondary | ICD-10-CM | POA: Diagnosis not present

## 2017-03-23 DIAGNOSIS — N183 Chronic kidney disease, stage 3 (moderate): Secondary | ICD-10-CM | POA: Diagnosis not present

## 2017-03-23 DIAGNOSIS — J449 Chronic obstructive pulmonary disease, unspecified: Secondary | ICD-10-CM | POA: Diagnosis not present

## 2017-03-23 DIAGNOSIS — E1122 Type 2 diabetes mellitus with diabetic chronic kidney disease: Secondary | ICD-10-CM | POA: Diagnosis not present

## 2017-03-23 DIAGNOSIS — T814XXD Infection following a procedure, subsequent encounter: Secondary | ICD-10-CM | POA: Diagnosis not present

## 2017-03-23 DIAGNOSIS — I2510 Atherosclerotic heart disease of native coronary artery without angina pectoris: Secondary | ICD-10-CM | POA: Diagnosis not present

## 2017-03-23 DIAGNOSIS — B9689 Other specified bacterial agents as the cause of diseases classified elsewhere: Secondary | ICD-10-CM | POA: Diagnosis not present

## 2017-03-24 DIAGNOSIS — J449 Chronic obstructive pulmonary disease, unspecified: Secondary | ICD-10-CM | POA: Diagnosis not present

## 2017-03-24 DIAGNOSIS — N183 Chronic kidney disease, stage 3 (moderate): Secondary | ICD-10-CM | POA: Diagnosis not present

## 2017-03-24 DIAGNOSIS — E1122 Type 2 diabetes mellitus with diabetic chronic kidney disease: Secondary | ICD-10-CM | POA: Diagnosis not present

## 2017-03-24 DIAGNOSIS — I2510 Atherosclerotic heart disease of native coronary artery without angina pectoris: Secondary | ICD-10-CM | POA: Diagnosis not present

## 2017-03-24 DIAGNOSIS — T814XXD Infection following a procedure, subsequent encounter: Secondary | ICD-10-CM | POA: Diagnosis not present

## 2017-03-24 DIAGNOSIS — B9689 Other specified bacterial agents as the cause of diseases classified elsewhere: Secondary | ICD-10-CM | POA: Diagnosis not present

## 2017-03-25 DIAGNOSIS — B9689 Other specified bacterial agents as the cause of diseases classified elsewhere: Secondary | ICD-10-CM | POA: Diagnosis not present

## 2017-03-25 DIAGNOSIS — T814XXD Infection following a procedure, subsequent encounter: Secondary | ICD-10-CM | POA: Diagnosis not present

## 2017-03-25 DIAGNOSIS — E1122 Type 2 diabetes mellitus with diabetic chronic kidney disease: Secondary | ICD-10-CM | POA: Diagnosis not present

## 2017-03-25 DIAGNOSIS — J449 Chronic obstructive pulmonary disease, unspecified: Secondary | ICD-10-CM | POA: Diagnosis not present

## 2017-03-25 DIAGNOSIS — N183 Chronic kidney disease, stage 3 (moderate): Secondary | ICD-10-CM | POA: Diagnosis not present

## 2017-03-25 DIAGNOSIS — I2510 Atherosclerotic heart disease of native coronary artery without angina pectoris: Secondary | ICD-10-CM | POA: Diagnosis not present

## 2017-03-26 ENCOUNTER — Ambulatory Visit (INDEPENDENT_AMBULATORY_CARE_PROVIDER_SITE_OTHER): Payer: Self-pay | Admitting: Thoracic Surgery (Cardiothoracic Vascular Surgery)

## 2017-03-26 VITALS — BP 99/60 | Resp 16 | Ht 73.0 in | Wt 221.0 lb

## 2017-03-26 DIAGNOSIS — S21109A Unspecified open wound of unspecified front wall of thorax without penetration into thoracic cavity, initial encounter: Secondary | ICD-10-CM

## 2017-03-26 DIAGNOSIS — Z951 Presence of aortocoronary bypass graft: Secondary | ICD-10-CM

## 2017-03-26 NOTE — Progress Notes (Signed)
[address]       [place] [address]             [phone]     HPI: Mr. Dennard returns follow-up of his sternal wound.  He had coronary bypass grafting 4 on 02/15/2017. He had a combined left carotid endarterectomy by Dr. Scot Dock at the same time. He developed drainage from his sternum I saw him in the office on 03/15/2017. He had a superficial sternal wound infection. We treated him with antibiotics and local wound care. I last saw him in the office a week ago. The surrounding erythema had improved but he still had some exudate in the wound. He's been having his dressing changed by home health nurse on a daily basis.  His pain has improved significantly. He's not having any fevers or chills. He's not noticing nearly as much drainage with the dressing changes as he was before.  Past Medical History:  Diagnosis Date  . Anemia 1964  . Arthritis    "touch starting to show up in the joints" (12/11/2013)  . CAD (coronary artery disease), native coronary artery    a. Cath 01/30/17 showing multivessel CAD --> underwent CABG on 02/15/2017 with LIMA-LAD, SVG-PDA, and SVG-Ramus-OM1.   . Carotid artery calcification    a. 01/2017: s/p L CEA performed at time of CABG  . COPD (chronic obstructive pulmonary disease) ([place])   . Diabetes ([place])   . Dyspnea    with exertion  . Former heavy tobacco smoker   . GERD (gastroesophageal reflux disease)    Dr. Michail Sermon  . History of alcohol abuse   . Hyperlipemia     Current Outpatient Prescriptions  Medication Sig Dispense Refill  . aspirin 81 MG tablet Take 1 tablet (81 mg total) by mouth daily. 30 tablet 6  . atorvastatin (LIPITOR) 40 MG tablet Take 1 tablet (40 mg total) by mouth at bedtime. 90 tablet 3  . bisoprolol (ZEBETA) 5 MG tablet Take 1 tablet (5 mg total) by mouth daily. 90 tablet 3  . blood glucose meter kit and supplies Dispense based on patient and insurance preference. Use up to four times daily as directed. (FOR  ICD-9 250.00, 250.01). 1 each 0  . esomeprazole (NEXIUM) 40 MG capsule Take 1 capsule (40 mg total) by mouth daily as needed (for acid reflux). 90 capsule 3  . glipiZIDE (GLUCOTROL) 5 MG tablet Take 0.5 tablets (2.5 mg total) by mouth daily before breakfast. (Patient taking differently: Take 2.5 mg by mouth daily at 3 pm. ) 30 tablet 2  . Lactobacillus Casei-Folic Acid (RESTORA RX) 60-1.25 MG CAPS Take 1 tablet by mouth as needed.    . traMADol (ULTRAM) 50 MG tablet Take 50 mg by mouth every 6 (six) hours as needed.     No current facility-administered medications for this visit.     Physical Exam BP 99/60 (BP Location: Right Arm, Patient Position: Sitting, Cuff Size: Large)   Resp 16   Ht _0  (1.854 m)   Wt 221 lb (100.2 kg)   SpO2 95% Comment: ON RA  BMI 30.2 kg/m  71-year-old man in no acute distress Alert and oriented 3 with no focal deficits Sternal incision with a 5 mm opening with granulation tissue. Wound probed with Q-tip. No purulent small amount of blood on Q-tip.   Diagnostic Tests: none  Impression: Paul Mcneil is a 71 year old gentleman who had coronary bypass grafting about 5 weeks ago complicated by a  sternal wound infection. He was treated initially with antibiotics. He has been office for over a week without any evidence of worsening infection.  We'll continue with local wound care. At this point I don't think we need peroxide anymore. We will just clean the wound with saline and packed with a saline wet to dry gauze on a daily basis. He may take a shower.  Plan: Return in 2 weeks  Melrose Nakayama, MD [facility] [phone]

## 2017-03-27 DIAGNOSIS — T814XXD Infection following a procedure, subsequent encounter: Secondary | ICD-10-CM | POA: Diagnosis not present

## 2017-03-27 DIAGNOSIS — J449 Chronic obstructive pulmonary disease, unspecified: Secondary | ICD-10-CM | POA: Diagnosis not present

## 2017-03-27 DIAGNOSIS — B9689 Other specified bacterial agents as the cause of diseases classified elsewhere: Secondary | ICD-10-CM | POA: Diagnosis not present

## 2017-03-27 DIAGNOSIS — I2510 Atherosclerotic heart disease of native coronary artery without angina pectoris: Secondary | ICD-10-CM | POA: Diagnosis not present

## 2017-03-27 DIAGNOSIS — E1122 Type 2 diabetes mellitus with diabetic chronic kidney disease: Secondary | ICD-10-CM | POA: Diagnosis not present

## 2017-03-27 DIAGNOSIS — N183 Chronic kidney disease, stage 3 (moderate): Secondary | ICD-10-CM | POA: Diagnosis not present

## 2017-03-28 DIAGNOSIS — T814XXD Infection following a procedure, subsequent encounter: Secondary | ICD-10-CM | POA: Diagnosis not present

## 2017-03-28 DIAGNOSIS — N183 Chronic kidney disease, stage 3 (moderate): Secondary | ICD-10-CM | POA: Diagnosis not present

## 2017-03-28 DIAGNOSIS — I2510 Atherosclerotic heart disease of native coronary artery without angina pectoris: Secondary | ICD-10-CM | POA: Diagnosis not present

## 2017-03-28 DIAGNOSIS — E1122 Type 2 diabetes mellitus with diabetic chronic kidney disease: Secondary | ICD-10-CM | POA: Diagnosis not present

## 2017-03-28 DIAGNOSIS — B9689 Other specified bacterial agents as the cause of diseases classified elsewhere: Secondary | ICD-10-CM | POA: Diagnosis not present

## 2017-03-28 DIAGNOSIS — J449 Chronic obstructive pulmonary disease, unspecified: Secondary | ICD-10-CM | POA: Diagnosis not present

## 2017-03-29 DIAGNOSIS — I2510 Atherosclerotic heart disease of native coronary artery without angina pectoris: Secondary | ICD-10-CM | POA: Diagnosis not present

## 2017-03-29 DIAGNOSIS — J449 Chronic obstructive pulmonary disease, unspecified: Secondary | ICD-10-CM | POA: Diagnosis not present

## 2017-03-29 DIAGNOSIS — T814XXD Infection following a procedure, subsequent encounter: Secondary | ICD-10-CM | POA: Diagnosis not present

## 2017-03-29 DIAGNOSIS — E1122 Type 2 diabetes mellitus with diabetic chronic kidney disease: Secondary | ICD-10-CM | POA: Diagnosis not present

## 2017-03-29 DIAGNOSIS — N183 Chronic kidney disease, stage 3 (moderate): Secondary | ICD-10-CM | POA: Diagnosis not present

## 2017-03-29 DIAGNOSIS — B9689 Other specified bacterial agents as the cause of diseases classified elsewhere: Secondary | ICD-10-CM | POA: Diagnosis not present

## 2017-03-30 DIAGNOSIS — B9689 Other specified bacterial agents as the cause of diseases classified elsewhere: Secondary | ICD-10-CM | POA: Diagnosis not present

## 2017-03-30 DIAGNOSIS — E1122 Type 2 diabetes mellitus with diabetic chronic kidney disease: Secondary | ICD-10-CM | POA: Diagnosis not present

## 2017-03-30 DIAGNOSIS — I2510 Atherosclerotic heart disease of native coronary artery without angina pectoris: Secondary | ICD-10-CM | POA: Diagnosis not present

## 2017-03-30 DIAGNOSIS — N183 Chronic kidney disease, stage 3 (moderate): Secondary | ICD-10-CM | POA: Diagnosis not present

## 2017-03-30 DIAGNOSIS — J449 Chronic obstructive pulmonary disease, unspecified: Secondary | ICD-10-CM | POA: Diagnosis not present

## 2017-03-30 DIAGNOSIS — T814XXD Infection following a procedure, subsequent encounter: Secondary | ICD-10-CM | POA: Diagnosis not present

## 2017-04-01 DIAGNOSIS — N183 Chronic kidney disease, stage 3 (moderate): Secondary | ICD-10-CM | POA: Diagnosis not present

## 2017-04-01 DIAGNOSIS — E1122 Type 2 diabetes mellitus with diabetic chronic kidney disease: Secondary | ICD-10-CM | POA: Diagnosis not present

## 2017-04-01 DIAGNOSIS — B9689 Other specified bacterial agents as the cause of diseases classified elsewhere: Secondary | ICD-10-CM | POA: Diagnosis not present

## 2017-04-01 DIAGNOSIS — T814XXD Infection following a procedure, subsequent encounter: Secondary | ICD-10-CM | POA: Diagnosis not present

## 2017-04-01 DIAGNOSIS — I2510 Atherosclerotic heart disease of native coronary artery without angina pectoris: Secondary | ICD-10-CM | POA: Diagnosis not present

## 2017-04-01 DIAGNOSIS — J449 Chronic obstructive pulmonary disease, unspecified: Secondary | ICD-10-CM | POA: Diagnosis not present

## 2017-04-02 ENCOUNTER — Telehealth (HOSPITAL_COMMUNITY): Payer: Self-pay

## 2017-04-02 ENCOUNTER — Encounter: Payer: Medicare Other | Admitting: Thoracic Surgery (Cardiothoracic Vascular Surgery)

## 2017-04-02 NOTE — Telephone Encounter (Signed)
I called and spoke to patient about scheduling for cardiac rehab. Patient stated he was interested, but he was not ready to schedule and to call him back in 2 weeks. I confirmed with patient that I will contact him in 2 weeks.

## 2017-04-09 ENCOUNTER — Encounter: Payer: Self-pay | Admitting: Thoracic Surgery (Cardiothoracic Vascular Surgery)

## 2017-04-09 ENCOUNTER — Ambulatory Visit (INDEPENDENT_AMBULATORY_CARE_PROVIDER_SITE_OTHER): Payer: Self-pay | Admitting: Thoracic Surgery (Cardiothoracic Vascular Surgery)

## 2017-04-09 VITALS — BP 109/60 | HR 74 | Resp 20 | Ht 73.0 in | Wt 225.0 lb

## 2017-04-09 DIAGNOSIS — Z951 Presence of aortocoronary bypass graft: Secondary | ICD-10-CM

## 2017-04-09 NOTE — Progress Notes (Signed)
[address]       [place] [address]             [phone]       HPI: Paul Mcneil returns for a scheduled follow-up visit  He is a 71 year old man who had coronary bypass grafting 4 on 02/15/2017. He developed a superficial wound infection about a month postop which was treated with antibiotics and local wound care. He has been packing the wound since then. I last saw him on 03/26/2017. His pain was much better and the drainage had decreased.  He says that there now is not really a cavity to pack. His pain continues to improve. His overall stamina and sense of well-being have improved significantly as well. There has been minimal drainage on the dressings.  Past Medical History:  Diagnosis Date  . Anemia 1964  . Arthritis    "touch starting to show up in the joints" (12/11/2013)  . CAD (coronary artery disease), native coronary artery    a. Cath 01/30/17 showing multivessel CAD --> underwent CABG on 02/15/2017 with LIMA-LAD, SVG-PDA, and SVG-Ramus-OM1.   . Carotid artery calcification    a. 01/2017: s/p L CEA performed at time of CABG  . COPD (chronic obstructive pulmonary disease) ([place])   . Diabetes ([place])   . Dyspnea    with exertion  . Former heavy tobacco smoker   . GERD (gastroesophageal reflux disease)    Dr. Michail Sermon  . History of alcohol abuse   . Hyperlipemia     Current Outpatient Prescriptions  Medication Sig Dispense Refill  . aspirin 81 MG tablet Take 1 tablet (81 mg total) by mouth daily. 30 tablet 6  . atorvastatin (LIPITOR) 40 MG tablet Take 1 tablet (40 mg total) by mouth at bedtime. 90 tablet 3  . bisoprolol (ZEBETA) 5 MG tablet Take 1 tablet (5 mg total) by mouth daily. 90 tablet 3  . blood glucose meter kit and supplies Dispense based on patient and insurance preference. Use up to four times daily as directed. (FOR ICD-9 250.00, 250.01). 1 each 0  . esomeprazole (NEXIUM) 40 MG capsule Take 1 capsule (40 mg total) by mouth daily as  needed (for acid reflux). 90 capsule 3  . glipiZIDE (GLUCOTROL) 5 MG tablet Take 0.5 tablets (2.5 mg total) by mouth daily before breakfast. (Patient taking differently: Take 2.5 mg by mouth daily at 3 pm. ) 30 tablet 2  . Lactobacillus Casei-Folic Acid (RESTORA RX) 60-1.25 MG CAPS Take 1 tablet by mouth as needed.     No current facility-administered medications for this visit.     Physical Exam BP 109/60   Pulse 74   Resp 20   Ht '6\' 1"'$  (1.854 m)   Wt 225 lb (102.1 kg)   SpO2 98%   BMI 29.46 kg/m  71 year old man in no acute distress Alert and oriented 3 with no focal deficits Sternum stable. Incision is a small amount of hypertrophic granulation tissue, no erythema Lungs clear with equal breath sounds bilaterally  Diagnostic Tests:  Impression: Paul Mcneil is a 71 year old man who had coronary bypass grafting 4 about 6 weeks ago. From a cardiac standpoint is doing well. He did have a superficial wound infection that required antibiotics and local wound care.  He has been off antibiotics with no fevers or erythema. The wound continues to heal well. There is some hypertrophic granulation tissue which I cauterized with silver nitrate. This point systemic however with a  dry dressing and at the corner of the dressing into the area until the skin has healed over.  Plan: Return in 3 weeks or sooner if needed  Melrose Nakayama, MD [facility] [phone]

## 2017-04-10 ENCOUNTER — Encounter: Payer: Medicare Other | Admitting: Vascular Surgery

## 2017-04-10 DIAGNOSIS — J449 Chronic obstructive pulmonary disease, unspecified: Secondary | ICD-10-CM | POA: Diagnosis not present

## 2017-04-10 DIAGNOSIS — T814XXD Infection following a procedure, subsequent encounter: Secondary | ICD-10-CM | POA: Diagnosis not present

## 2017-04-10 DIAGNOSIS — E1122 Type 2 diabetes mellitus with diabetic chronic kidney disease: Secondary | ICD-10-CM | POA: Diagnosis not present

## 2017-04-10 DIAGNOSIS — I2510 Atherosclerotic heart disease of native coronary artery without angina pectoris: Secondary | ICD-10-CM | POA: Diagnosis not present

## 2017-04-10 DIAGNOSIS — N183 Chronic kidney disease, stage 3 (moderate): Secondary | ICD-10-CM | POA: Diagnosis not present

## 2017-04-10 DIAGNOSIS — B9689 Other specified bacterial agents as the cause of diseases classified elsewhere: Secondary | ICD-10-CM | POA: Diagnosis not present

## 2017-04-11 ENCOUNTER — Telehealth (HOSPITAL_COMMUNITY): Payer: Self-pay

## 2017-04-11 NOTE — Telephone Encounter (Signed)
I called patient to follow up per our conversation to contact him in 2 weeks to discuss scheduling for cardiac rehab.  Upon identifying myself patient was very rude and told me to take him off our calling list and he was not interested in cardiac rehab and stop calling him and hung up. Referral closed.

## 2017-04-16 DIAGNOSIS — E1122 Type 2 diabetes mellitus with diabetic chronic kidney disease: Secondary | ICD-10-CM | POA: Diagnosis not present

## 2017-04-16 DIAGNOSIS — N183 Chronic kidney disease, stage 3 (moderate): Secondary | ICD-10-CM | POA: Diagnosis not present

## 2017-04-16 DIAGNOSIS — B9689 Other specified bacterial agents as the cause of diseases classified elsewhere: Secondary | ICD-10-CM | POA: Diagnosis not present

## 2017-04-16 DIAGNOSIS — J449 Chronic obstructive pulmonary disease, unspecified: Secondary | ICD-10-CM | POA: Diagnosis not present

## 2017-04-16 DIAGNOSIS — I2510 Atherosclerotic heart disease of native coronary artery without angina pectoris: Secondary | ICD-10-CM | POA: Diagnosis not present

## 2017-04-16 DIAGNOSIS — T814XXD Infection following a procedure, subsequent encounter: Secondary | ICD-10-CM | POA: Diagnosis not present

## 2017-04-24 DIAGNOSIS — D649 Anemia, unspecified: Secondary | ICD-10-CM | POA: Diagnosis not present

## 2017-04-24 DIAGNOSIS — N183 Chronic kidney disease, stage 3 (moderate): Secondary | ICD-10-CM | POA: Diagnosis not present

## 2017-04-24 DIAGNOSIS — B359 Dermatophytosis, unspecified: Secondary | ICD-10-CM | POA: Diagnosis not present

## 2017-04-24 DIAGNOSIS — E119 Type 2 diabetes mellitus without complications: Secondary | ICD-10-CM | POA: Diagnosis not present

## 2017-04-24 DIAGNOSIS — Z79899 Other long term (current) drug therapy: Secondary | ICD-10-CM | POA: Diagnosis not present

## 2017-04-24 DIAGNOSIS — Z23 Encounter for immunization: Secondary | ICD-10-CM | POA: Diagnosis not present

## 2017-04-24 DIAGNOSIS — J439 Emphysema, unspecified: Secondary | ICD-10-CM | POA: Diagnosis not present

## 2017-04-24 DIAGNOSIS — E782 Mixed hyperlipidemia: Secondary | ICD-10-CM | POA: Diagnosis not present

## 2017-04-24 DIAGNOSIS — I2510 Atherosclerotic heart disease of native coronary artery without angina pectoris: Secondary | ICD-10-CM | POA: Diagnosis not present

## 2017-04-25 DIAGNOSIS — N183 Chronic kidney disease, stage 3 (moderate): Secondary | ICD-10-CM | POA: Diagnosis not present

## 2017-04-25 DIAGNOSIS — B9689 Other specified bacterial agents as the cause of diseases classified elsewhere: Secondary | ICD-10-CM | POA: Diagnosis not present

## 2017-04-25 DIAGNOSIS — E1122 Type 2 diabetes mellitus with diabetic chronic kidney disease: Secondary | ICD-10-CM | POA: Diagnosis not present

## 2017-04-25 DIAGNOSIS — J449 Chronic obstructive pulmonary disease, unspecified: Secondary | ICD-10-CM | POA: Diagnosis not present

## 2017-04-25 DIAGNOSIS — T814XXD Infection following a procedure, subsequent encounter: Secondary | ICD-10-CM | POA: Diagnosis not present

## 2017-04-25 DIAGNOSIS — I2510 Atherosclerotic heart disease of native coronary artery without angina pectoris: Secondary | ICD-10-CM | POA: Diagnosis not present

## 2017-04-29 DIAGNOSIS — I2510 Atherosclerotic heart disease of native coronary artery without angina pectoris: Secondary | ICD-10-CM | POA: Diagnosis not present

## 2017-04-29 DIAGNOSIS — T814XXD Infection following a procedure, subsequent encounter: Secondary | ICD-10-CM | POA: Diagnosis not present

## 2017-04-29 DIAGNOSIS — J449 Chronic obstructive pulmonary disease, unspecified: Secondary | ICD-10-CM | POA: Diagnosis not present

## 2017-04-29 DIAGNOSIS — E1122 Type 2 diabetes mellitus with diabetic chronic kidney disease: Secondary | ICD-10-CM | POA: Diagnosis not present

## 2017-04-29 DIAGNOSIS — N183 Chronic kidney disease, stage 3 (moderate): Secondary | ICD-10-CM | POA: Diagnosis not present

## 2017-04-29 DIAGNOSIS — B9689 Other specified bacterial agents as the cause of diseases classified elsewhere: Secondary | ICD-10-CM | POA: Diagnosis not present

## 2017-04-30 ENCOUNTER — Encounter: Payer: Self-pay | Admitting: Thoracic Surgery (Cardiothoracic Vascular Surgery)

## 2017-04-30 ENCOUNTER — Ambulatory Visit (INDEPENDENT_AMBULATORY_CARE_PROVIDER_SITE_OTHER): Payer: Self-pay | Admitting: Thoracic Surgery (Cardiothoracic Vascular Surgery)

## 2017-04-30 VITALS — BP 122/72 | HR 65 | Resp 16 | Ht 73.0 in | Wt 225.0 lb

## 2017-04-30 DIAGNOSIS — Z951 Presence of aortocoronary bypass graft: Secondary | ICD-10-CM

## 2017-04-30 DIAGNOSIS — S21109A Unspecified open wound of unspecified front wall of thorax without penetration into thoracic cavity, initial encounter: Secondary | ICD-10-CM

## 2017-04-30 NOTE — Progress Notes (Signed)
[address]       [place] [address]             [phone]     HPI: Mr. Clinger returns for follow-up of his sternal wound  Mr. Sudano is a 71 year old man who had coronary bypass grafting 4 on 02/15/2017. About a month after the surgery he developed a superficial sternal wound infection. That was treated with antibiotics and local wound care. I saw him in the office a couple of weeks ago and there was some hypertrophic granulation tissue. We cauterized that with silver nitrate. He's has been keeping it covered with a dry dressing at this point. He does notice some yellow drainage on the dressing when he changes it.  Otherwise his been doing well. His energy is returning. He feels like he is getting some stamina back. He doesn't have any significant incisional pain.  Past Medical History:  Diagnosis Date  . Anemia 1964  . Arthritis    "touch starting to show up in the joints" (12/11/2013)  . CAD (coronary artery disease), native coronary artery    a. Cath 01/30/17 showing multivessel CAD --> underwent CABG on 02/15/2017 with LIMA-LAD, SVG-PDA, and SVG-Ramus-OM1.   . Carotid artery calcification    a. 01/2017: s/p L CEA performed at time of CABG  . COPD (chronic obstructive pulmonary disease) ([place])   . Diabetes ([place])   . Dyspnea    with exertion  . Former heavy tobacco smoker   . GERD (gastroesophageal reflux disease)    Dr. Michail Sermon  . History of alcohol abuse   . Hyperlipemia     Current Outpatient Prescriptions  Medication Sig Dispense Refill  . aspirin 81 MG tablet Take 1 tablet (81 mg total) by mouth daily. 30 tablet 6  . atorvastatin (LIPITOR) 40 MG tablet Take 1 tablet (40 mg total) by mouth at bedtime. 90 tablet 3  . bisoprolol (ZEBETA) 5 MG tablet Take 1 tablet (5 mg total) by mouth daily. 90 tablet 3  . blood glucose meter kit and supplies Dispense based on patient and insurance preference. Use up to four times daily as directed. (FOR ICD-9 250.00,  250.01). 1 each 0  . esomeprazole (NEXIUM) 40 MG capsule Take 1 capsule (40 mg total) by mouth daily as needed (for acid reflux). 90 capsule 3  . glipiZIDE (GLUCOTROL) 5 MG tablet Take 0.5 tablets (2.5 mg total) by mouth daily before breakfast. (Patient taking differently: Take 2.5 mg by mouth daily at 3 pm. ) 30 tablet 2  . Lactobacillus Casei-Folic Acid (RESTORA RX) 60-1.25 MG CAPS Take 1 tablet by mouth as needed.     No current facility-administered medications for this visit.     Physical Exam BP 122/72 (BP Location: Right Arm, Patient Position: Sitting, Cuff Size: Large)   Pulse 65   Resp 16   Ht '6\' 1"'$  (1.854 m)   Wt 225 lb (102.1 kg)   SpO2 95% Comment: ON RA  BMI 29.33 kg/m  71 year old man in no acute distress Alert and oriented 3 with no focal deficits Sternal incision with 2-3 mm area of hypertrophic granulation tissue   Impression: Mr. Pannone is a 71 year old gentleman who is now about to a half months out from coronary bypass grafting. He is doing extremely well from a cardiac standpoint. He has not had any issues in that regard.  He did develop a superficial sternal wound infection. That has now essentially resolved but there is still  a small area of hypertrophic granulation tissue. I suspect the "drainage" that he seeing his exudate from that. I cauterized that again with silver nitrate. He will keep it covered with a dry dressing.  Plan: At this point I think the wound should heal fine without any further intervention. He was instructed to call if he has any issues with the wound and I will be happy to see him.  Melrose Nakayama, M.D.  [facility] [phone]

## 2017-05-29 DIAGNOSIS — E119 Type 2 diabetes mellitus without complications: Secondary | ICD-10-CM | POA: Diagnosis not present

## 2017-06-06 ENCOUNTER — Ambulatory Visit (INDEPENDENT_AMBULATORY_CARE_PROVIDER_SITE_OTHER): Payer: Medicare Other | Admitting: Cardiology

## 2017-06-06 ENCOUNTER — Encounter: Payer: Self-pay | Admitting: Cardiology

## 2017-06-06 VITALS — BP 108/64 | HR 82 | Ht 73.0 in | Wt 236.0 lb

## 2017-06-06 DIAGNOSIS — Z951 Presence of aortocoronary bypass graft: Secondary | ICD-10-CM

## 2017-06-06 DIAGNOSIS — I10 Essential (primary) hypertension: Secondary | ICD-10-CM

## 2017-06-06 DIAGNOSIS — R0609 Other forms of dyspnea: Secondary | ICD-10-CM | POA: Diagnosis not present

## 2017-06-06 DIAGNOSIS — E785 Hyperlipidemia, unspecified: Secondary | ICD-10-CM | POA: Diagnosis not present

## 2017-06-06 DIAGNOSIS — I6523 Occlusion and stenosis of bilateral carotid arteries: Secondary | ICD-10-CM

## 2017-06-06 DIAGNOSIS — I200 Unstable angina: Secondary | ICD-10-CM

## 2017-06-06 DIAGNOSIS — I2510 Atherosclerotic heart disease of native coronary artery without angina pectoris: Secondary | ICD-10-CM

## 2017-06-06 NOTE — Progress Notes (Addendum)
PCP: Paul Amel, MD  Clinic Note: Chief Complaint  Patient presents with  . Follow-up    pain on each side on incision  . Coronary Artery Disease    Post CABG    HPI: Paul Mcneil is a 71 y.o. male with a PMH below who presents today for 2-monthfollow-up for CAD-CABG.  [place] & her family.  He quit smoking in March 2018 --> ago along with alcohol in last 2017.   I initially met him back in June 2018 he was sent for Myoview stress test.  He was then seen by Paul Ransomto discuss the abnormal stress test and he was referred for cardiac catheterization that showed severe CAD and he was referred for CABG.  Paul BAYUSwas last seen on March 11, 2017 by Paul Mcneil post CABG follow-up.  He is also seen Dr. HRoxan Hockeyseveral times.  Last visit was in October.   Recent Hospitalizations:  CABG + Paul CEA -  02/15/2017: LIMA to LAD, SVG to PDA, SVG to Ramus and OM1. (Paul Mcneil& [facility]  Postop course in the outpatient setting complicated by superficial wound infection treated with debridement and oral antibiotics. -->  Now fully healed  Studies Personally Reviewed - (if available, images/films reviewed: From Epic Chart or Care Everywhere)   Cardiac cath and echo reviewed below.  Interval History: Paul Mcneil today overall feeling quite well.  He is done well since his surgery.  He says that his exertional dyspnea and chest discomfort that he had when I first met it was no longer present.  He is not having any resting or exertional chest tightness or pressure.  No PND, orthopnea or edema.  He is done very well having quit smoking.  He is basically waiting forward to get a get back into full activity.  His energy level is pretty much fully back to where his been several years ago.  He also notes that he is no longer noticing as much leg fatigue as  he had before. He has not yet seen Dr. DScot Dockfollowing his endarterectomy.  Cardiac review of symptoms is negative as noted below: No chest pain or shortness of breath with rest or exertion.  No PND, orthopnea or edema.  No palpitations, lightheadedness, dizziness, weakness or syncope/near syncope. No TIA/amaurosis fugax symptoms. No melena, hematochezia, hematuria, or epstaxis. No obvious claudication, leg fatigue seems to have improved after CABG  ROS: A comprehensive was performed. Review of Systems  Constitutional: Negative for fever and malaise/fatigue.  HENT: Negative for congestion and nosebleeds.   Respiratory: Positive for cough (Improved morning cough).   Gastrointestinal: Negative for blood in stool and melena.  Genitourinary: Negative for dysuria and hematuria.  Musculoskeletal: Positive for back pain and joint pain (Hips and knees).  Skin: Negative.        Sternal wound seems to be fully healed now.  Neurological: Negative for dizziness.  Endo/Heme/Allergies: Does not bruise/bleed easily.  Psychiatric/Behavioral: Negative for memory loss. The patient is not nervous/anxious.   All other systems reviewed and are negative.  I have reviewed and (if needed) personally updated the patient's problem list, medications, allergies, past medical and surgical history, social and family history.   Past Medical History:  Diagnosis Date  . Anemia 1964  . Arthritis    "touch starting to show up in the joints" (  12/11/2013)  . CAD (coronary artery disease), native coronary artery    a. Cath 01/30/17 showing multivessel CAD --> underwent CABG on 02/15/2017 with LIMA-LAD, SVG-PDA, and SVG-Ramus-OM1.   . Carotid artery calcification    a. 01/2017: s/p Paul CEA performed at time of CABG; Existing Mod-Severe R Carotid stenosis  . COPD (chronic obstructive pulmonary disease) (Paul Mcneil)   . Diabetes ([place])   . Dyspnea    with exertion  . Former heavy tobacco smoker   . GERD (gastroesophageal  reflux disease)    Paul Mcneil  . History of alcohol abuse   . Hyperlipemia     Past Surgical History:  Procedure Laterality Date  . APPENDECTOMY  12/11/2013   "laparoscopic"  . APPENDECTOMY LAPAROSCOPIC N/A 12/11/2013   Performed by Paul Mesa, MD at [facility]  . BRONCHOSCOPY    . CHOLECYSTECTOMY  ~ 2010  . CORONARY ARTERY BYPASS GRAFTING (CABG)times four. LAD to  LIMA, SVG to PDA , Sequential SVG to OM1 and Ramus N/A 02/15/2017   Performed by Paul Nakayama, MD at [facility]  . LEFT ENDARTERECTOMY CAROTID Left 02/15/2017   Performed by Paul Mould, MD at [facility]  . Right/Left Heart Cath and Coronary Angiography N/A 01/30/2017   Performed by Paul Sine, MD at [facility]  . TONSILLECTOMY AND ADENOIDECTOMY  1950's  . TRANSESOPHAGEAL ECHOCARDIOGRAM (TEE) N/A 02/15/2017   Performed by Paul Nakayama, MD at [facility]  . 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  Otherwise normal cavity size.  Mild LVH.  EF 60-65%.  GR 1 DD.  Mild MR.    Right And Left Heart Cath 01/30/2017  Significant multivessel CAD with 50-60% smooth d-LM, 40% mid LAD. 50% mid LAD stenoses; RI --70% ostial & 80% prox RI; 90% pCx w/  80% and 70% distal stenoses; 80% oxt-prox & 90% prox-mid followed by 100% CTA distal mid RCA w/ with left-to-right collaterals.  Normal right heart pressures.       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  Otherwise normal cavity size.  Mild LVH.  EF 60-65%.  GR 1 DD.  Mild MR.  Intra-Op TEE: EF 65-70%.  Vigorous wall motion.  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  Mild MR noted.   Current Meds  Medication Sig  . aspirin 81 MG tablet Take 1 tablet (81 mg total) by mouth daily.  Marland Kitchen atorvastatin (LIPITOR) 40 MG tablet Take 1 tablet (40 mg total) by mouth at bedtime.  . bisoprolol (ZEBETA) 5 MG tablet Take 1 tablet (5 mg total) by  mouth daily.  Marland Kitchen esomeprazole (NEXIUM) 40 MG capsule Take 1 capsule (40 mg total) by mouth daily as needed (for acid reflux).  Marland Kitchen glipiZIDE (GLUCOTROL) 5 MG tablet Take 0.5 tablets (2.5 mg total) by mouth daily before breakfast. (Patient taking differently: Take 2.5 mg by mouth daily at 3 pm. )  . Lactobacillus Casei-Folic Acid (RESTORA RX) 60-1.25 MG CAPS Take 1 tablet by mouth as needed.    Allergies  Allergen Reactions  . Ceclor [Cefaclor] Anaphylaxis, Hives and Other (See Comments)    BASED ON CRITERIA FOR ANAPHYLAXIS HIVES + HYPOTENSION    Social History   Socioeconomic History  . Marital status: Divorced    Spouse name: None  . Number of children: None  . Years of education: None  . Highest education level: None  Social Needs  . Financial resource strain: None  . Food insecurity - worry: None  . Food insecurity - inability: None  . Transportation needs - medical: None  . Transportation needs - non-medical: None  Occupational History  . Occupation: Engineer, structural    Comment: Retired from [employer], [place]  Tobacco Use  . Smoking status: Former Smoker    Packs/day: 2.00    Years: 52.00    Pack years: 104.00    Types: Cigarettes    Last attempt to quit: 09/21/2016    Years since quitting: 0.7  . Smokeless tobacco: Never Used  Substance and Sexual Activity  . Alcohol use: No    Alcohol/week: 12.0 oz    Types: 20 Glasses of wine per week  . Drug use: No  . Sexual activity: No  Other Topics Concern  . None  Social History Narrative   He is a former Engineer, structural from [place] who moved down to [place] in 2009 to be close to he daughter & her family.     He lives by himself, having been divorced. He has only the one daughter and 2 grandchildren.   Does not routinely exercise.      He quit smoking along with alcohol - March 2018    family history includes Cancer in his father; Rheum arthritis in his mother.  Wt Readings from Last 3 Encounters:  06/06/17 236 lb  (107 kg)  04/30/17 225 lb (102.1 kg)  04/09/17 225 lb (102.1 kg)    PHYSICAL EXAM BP 108/64   Pulse 82   Ht '6\' 1"'$  (1.854 m)   Wt 236 lb (107 kg)   BMI 31.14 kg/m  Physical Exam  Constitutional: He is oriented to person, place, and time. He appears well-developed and well-nourished. No distress.  HENT:  Head: Normocephalic and atraumatic.  Eyes: Conjunctivae and EOM are normal. Pupils are equal, round, and reactive to light.  Neck: No hepatojugular reflux and no JVD present. Carotid bruit is not present. No thyromegaly present.  Cardiovascular: Normal rate, regular rhythm and intact distal pulses. Exam reveals no gallop and no friction rub.  No murmur heard. Nondisplaced PMI.  Well-healed sternotomy scar with a focal area in the upper portion of the incision that has a small indentation, but is well-healed.  Pulmonary/Chest: Effort normal and breath sounds normal. No respiratory distress. He has no rales.  Abdominal: Soft. Bowel sounds are normal. He exhibits no distension. There is no tenderness. There is no rebound.  Musculoskeletal: Normal range of motion. He exhibits no edema or deformity.  Neurological: He is alert and oriented to person, place, and time.  Skin: Skin is warm and dry. No rash noted. No erythema.  Psychiatric: He has a normal mood and affect. His behavior is normal. Judgment and thought content normal.  Nursing note and vitals reviewed.    Adult ECG Report Not checked  Other studies Reviewed: Additional studies/ records that were reviewed today include:  Recent Labs:  --Reviewed:   ASSESSMENT / PLAN:  Mr. Darley is doing very well post CABG. his sternal wound finally healing.  Lipids show that they are quite well controlled now on current dose of atorvastatin.  Blood pressure is well controlled if anything a little bit low on bisoprolol. He is on low-dose aspirin.  Plan for now will be to continue risk factor modification.  Continue to increase activity  level/exercise  We will refer him back to Dr. Scot Mcneil for CEA f/u and for the  significant right-sided carotid artery disease as well.  Also probably for lower extremity arterial Dopplers given his leg fatigue as a partial anginal equivalent.  Problem List Items Addressed This Visit    CAD, multiple vessel (Chronic)    Cardiac catheterization showed multivessel CAD now status post CABG -pretty much all of his preop dyspnea, fatigue and leg pain has resolved.  He is back fully active now. Continue current dose of aspirin, statin and beta-blocker.  -Encouraged continued activity and exercise.      Carotid artery disease (HCC) (Chronic)    Bilateral carotid artery disease status post left carotid nephrectomy with existing right carotid disease.  Has not yet seen Dr. Doren Custard postop.  We will refer him back to Dr. Doren Custard to discuss the right carotid artery in follow-up for the left endarterectomy.      Relevant Orders   Ambulatory referral to Vascular Surgery   Dyslipidemia, goal LDL below 70 - Primary (Chronic)    Follow-up labs showed total cholesterol at 122, triglycerides 203, HDL 27 and LDL 54.  Indicates relatively well controlled labs on current dose of atorvastatin.  No change.      Dyspnea on exertion    Doing well post CABG      Essential hypertension (Chronic)    Excellent control blood pressure with current dose of beta-blocker.      S/P CABG x 4   Unstable angina (HCC) (Chronic)    Completely resolved post CABG         Current medicines are reviewed at length with the patient today. (+/- concerns) n/a The following changes have been made: n/a  Patient Instructions  NO CHANGE TO MEDICATIONS   You have been referred to Dr Scot Mcne[facility] at [facility] on [address]-- [place]    Your physician recommends that you schedule a follow-up appointment in 3 TO [place].    If you need a refill on your cardiac medications before your next  appointment, please call your pharmacy.   Studies Ordered:   Orders Placed This Encounter  Procedures  . Ambulatory referral to Vascular Surgery      Glenetta Hew, M.D., M.S. Interventional Cardiologist   Pager # [phone] Phone # [phone] [address] [place], [place] [address]

## 2017-06-06 NOTE — Patient Instructions (Addendum)
NO CHANGE TO MEDICATIONS   You have been referred to Dr Scot Dock at [facility] on [address]-- [place] physician recommends that you schedule a follow-up appointment in 3 TO [place].    If you need a refill on your cardiac medications before your next appointment, please call your pharmacy.

## 2017-06-08 ENCOUNTER — Encounter: Payer: Self-pay | Admitting: Cardiology

## 2017-06-09 ENCOUNTER — Encounter: Payer: Self-pay | Admitting: Cardiology

## 2017-06-09 NOTE — Assessment & Plan Note (Signed)
Excellent control blood pressure with current dose of beta-blocker.

## 2017-06-09 NOTE — Assessment & Plan Note (Signed)
Follow-up labs showed total cholesterol at 122, triglycerides 203, HDL 27 and LDL 54.  Indicates relatively well controlled labs on current dose of atorvastatin.  No change.

## 2017-06-09 NOTE — Assessment & Plan Note (Addendum)
Cardiac catheterization showed multivessel CAD now status post CABG -pretty much all of his preop dyspnea, fatigue and leg pain has resolved.  He is back fully active now. Continue current dose of aspirin, statin and beta-blocker.  -Encouraged continued activity and exercise.

## 2017-06-09 NOTE — Assessment & Plan Note (Signed)
Doing well post CABG

## 2017-06-09 NOTE — Assessment & Plan Note (Signed)
Completely resolved post CABG

## 2017-06-09 NOTE — Assessment & Plan Note (Signed)
Bilateral carotid artery disease status post left carotid nephrectomy with existing right carotid disease.  Has not yet seen Dr. Doren Custard postop.  We will refer him back to Dr. Doren Custard to discuss the right carotid artery in follow-up for the left endarterectomy.

## 2017-06-10 ENCOUNTER — Other Ambulatory Visit: Payer: Self-pay

## 2017-06-10 DIAGNOSIS — I6523 Occlusion and stenosis of bilateral carotid arteries: Secondary | ICD-10-CM

## 2017-06-11 ENCOUNTER — Other Ambulatory Visit: Payer: Self-pay | Admitting: Vascular Surgery

## 2017-06-17 ENCOUNTER — Other Ambulatory Visit: Payer: Self-pay

## 2017-06-17 ENCOUNTER — Ambulatory Visit (INDEPENDENT_AMBULATORY_CARE_PROVIDER_SITE_OTHER): Payer: Medicare Other | Admitting: Thoracic Surgery (Cardiothoracic Vascular Surgery)

## 2017-06-17 VITALS — BP 126/74 | HR 69 | Temp 98.7°F | Ht 73.0 in | Wt 236.0 lb

## 2017-06-17 DIAGNOSIS — Z951 Presence of aortocoronary bypass graft: Secondary | ICD-10-CM | POA: Diagnosis not present

## 2017-06-17 DIAGNOSIS — S21109A Unspecified open wound of unspecified front wall of thorax without penetration into thoracic cavity, initial encounter: Secondary | ICD-10-CM | POA: Diagnosis not present

## 2017-06-17 DIAGNOSIS — I200 Unstable angina: Secondary | ICD-10-CM | POA: Diagnosis not present

## 2017-06-17 NOTE — Progress Notes (Signed)
[address]       [place] [address]             [phone]      HPI: Paul Mcneil returns with concerns about sternal wound  He is a 71-year-old man who had coronary bypass grafting x4 on 02/15/2017.  About a month later he developed a superficial sternal wound infection treated with antibiotics and local wound care.  The infection cleared up and the wound was healing with packing although he did have some hypertrophic granulation tissue which required cauterization with silver nitrate on a couple of occasions.  I last saw him in the office in October and the wound looked like it had nearly healed.  Over the weekend he noted some puffiness but no drainage from the area that had been treated previously.  He has not had any fevers or chills.  He does have some generalized soreness around the area.  Past Medical History:  Diagnosis Date  . Anemia 1964  . Arthritis    "touch starting to show up in the joints" (12/11/2013)  . CAD (coronary artery disease), native coronary artery    a. Cath 01/30/17 showing multivessel CAD --> underwent CABG on 02/15/2017 with LIMA-LAD, SVG-PDA, and SVG-Ramus-OM1.   . Carotid artery calcification    a. 01/2017: s/p L CEA performed at time of CABG; Existing Mod-Severe R Carotid stenosis  . COPD (chronic obstructive pulmonary disease) ([place])   . Diabetes ([place])   . Dyspnea    with exertion  . Former heavy tobacco smoker   . GERD (gastroesophageal reflux disease)    Dr. Michail Sermon  . History of alcohol abuse   . Hyperlipemia     Current Outpatient Medications  Medication Sig Dispense Refill  . aspirin 81 MG tablet Take 1 tablet (81 mg total) by mouth daily. 30 tablet 6  . atorvastatin (LIPITOR) 40 MG tablet Take 1 tablet (40 mg total) by mouth at bedtime. 90 tablet 3  . bisoprolol (ZEBETA) 5 MG tablet Take 1 tablet (5 mg total) by mouth daily. 90 tablet 3  . esomeprazole (NEXIUM) 40 MG capsule Take 1 capsule (40 mg total) by mouth daily as  needed (for acid reflux). 90 capsule 3  . glipiZIDE (GLUCOTROL) 5 MG tablet Take 0.5 tablets (2.5 mg total) by mouth daily before breakfast. (Patient taking differently: Take 2.5 mg by mouth daily at 3 pm. ) 30 tablet 2  . Lactobacillus Casei-Folic Acid (RESTORA RX) 60-1.25 MG CAPS Take 1 tablet by mouth as needed.     No current facility-administered medications for this visit.     Physical Exam BP 126/74 (BP Location: Right Arm, Patient Position: Sitting, Cuff Size: Large)   Pulse 69   Temp 98.7 F (37.1 C) (Oral)   Ht 6\' 1"  (1.854 m)   Wt 236 lb (107 kg)   SpO2 98% Comment: ON RA  BMI 31.4 kg/m  71 year old man in no acute distress Sternal wound with 5 x 9 mm area of fluctuance. Incised and a small amount of purulent drainage was present.  The wound was probed with a Q-tip and there was no penetration to deep areas of the wound.   Impression: Recurrent superficial wound infection.  I do not see an indication for antibiotics at this time.  I think this can be managed with local wound care.  I offered the option of going back to the operating room and excising the end reclosing it.  That  would likely give a better cosmetic result.  I do think he is at risk for having this recur.  He does not want to do that at this time.  Plan: Normal saline wet-to-dry dressing changes  Return in 3 weeks for wound check  Melrose Nakayama, MD [facility] [phone]

## 2017-07-09 ENCOUNTER — Ambulatory Visit (INDEPENDENT_AMBULATORY_CARE_PROVIDER_SITE_OTHER): Payer: Medicare Other | Admitting: Thoracic Surgery (Cardiothoracic Vascular Surgery)

## 2017-07-09 ENCOUNTER — Other Ambulatory Visit: Payer: Self-pay

## 2017-07-09 ENCOUNTER — Other Ambulatory Visit: Payer: Self-pay | Admitting: Thoracic Surgery (Cardiothoracic Vascular Surgery)

## 2017-07-09 ENCOUNTER — Encounter: Payer: Self-pay | Admitting: Thoracic Surgery (Cardiothoracic Vascular Surgery)

## 2017-07-09 VITALS — BP 127/70 | HR 70 | Ht 73.0 in | Wt 230.0 lb

## 2017-07-09 DIAGNOSIS — T8149XA Infection following a procedure, other surgical site, initial encounter: Secondary | ICD-10-CM | POA: Diagnosis not present

## 2017-07-09 NOTE — Progress Notes (Signed)
[address]       ,[place] [address]             [phone]       HPI: Mr. Garden returns for a wound check of his sternum.  He is a 71 year old man who had coronary bypass grafting on 02/15/2017.  About a month afterwards he developed a superficial sternal wound infection.  That was managed with antibiotics and local wound care.  By October it looked like that had essentially healed.  And then in late November he noted some puffiness in the area of his previous infection.  There is a small fluctuant area that I incised and there was a small amount of purulent drainage.  This was treated with local wound care.  He did not require antibiotics.  He says that that is healed and he is not having any redness, drainage, or excessive pain in the area.  Past Medical History:  Diagnosis Date  . Anemia 1964  . Arthritis    "touch starting to show up in the joints" (12/11/2013)  . CAD (coronary artery disease), native coronary artery    a. Cath 01/30/17 showing multivessel CAD --> underwent CABG on 02/15/2017 with LIMA-LAD, SVG-PDA, and SVG-Ramus-OM1.   . Carotid artery calcification    a. 01/2017: s/p L CEA performed at time of CABG; Existing Mod-Severe R Carotid stenosis  . COPD (chronic obstructive pulmonary disease) ([place])   . Diabetes ([place])   . Dyspnea    with exertion  . Former heavy tobacco smoker   . GERD (gastroesophageal reflux disease)    Dr. Michail Sermon  . History of alcohol abuse   . Hyperlipemia     Current Outpatient Medications  Medication Sig Dispense Refill  . aspirin 81 MG tablet Take 1 tablet (81 mg total) by mouth daily. 30 tablet 6  . atorvastatin (LIPITOR) 40 MG tablet Take 1 tablet (40 mg total) by mouth at bedtime. 90 tablet 3  . bisoprolol (ZEBETA) 5 MG tablet Take 1 tablet (5 mg total) by mouth daily. 90 tablet 3  . esomeprazole (NEXIUM) 40 MG capsule Take 1 capsule (40 mg total) by mouth daily as needed (for acid reflux). 90 capsule 3  .  glipiZIDE (GLUCOTROL) 5 MG tablet Take 0.5 tablets (2.5 mg total) by mouth daily before breakfast. (Patient taking differently: Take 2.5 mg by mouth daily at 3 pm. ) 30 tablet 2  . Lactobacillus Casei-Folic Acid (RESTORA RX) 60-1.25 MG CAPS Take 1 tablet by mouth as needed.     No current facility-administered medications for this visit.     Physical Exam BP 127/70 (BP Location: Left Arm, Patient Position: Sitting, Cuff Size: Large)   Pulse 70   Ht 6\' 1"  (1.854 m)   Wt 230 lb (104.3 kg) Comment: per patient  SpO2 97%   BMI 30.78 kg/m  71 year old man in no acute distress Alert and oriented x3 with no focal deficits Sternal incision healed, sternum stable  Diagnostic Tests: none  Impression: 71 year old man who underwent bypass grafting back in July.  He has had recurrent superficial wound infections.  This point his incision looks to be well-healed with no evidence of infection.  The sternum is stable.  Plan: I will be happy to see him back anytime if I can be of any further assistance with his care.  He knows to call if he has any issues with his wound.  Melrose Nakayama, MD [facility] [phone]

## 2017-07-24 ENCOUNTER — Encounter: Payer: Medicare Other | Admitting: Vascular Surgery

## 2017-07-24 ENCOUNTER — Encounter (HOSPITAL_COMMUNITY): Payer: Medicare Other

## 2017-07-28 IMAGING — DX DG CHEST 1V PORT
1 series · 1 of 1 positions shown · non-contrast
Comparison: 02/15/2017

CLINICAL DATA: CABG.

EXAM:
PORTABLE CHEST 1 VIEW

[chest ap]
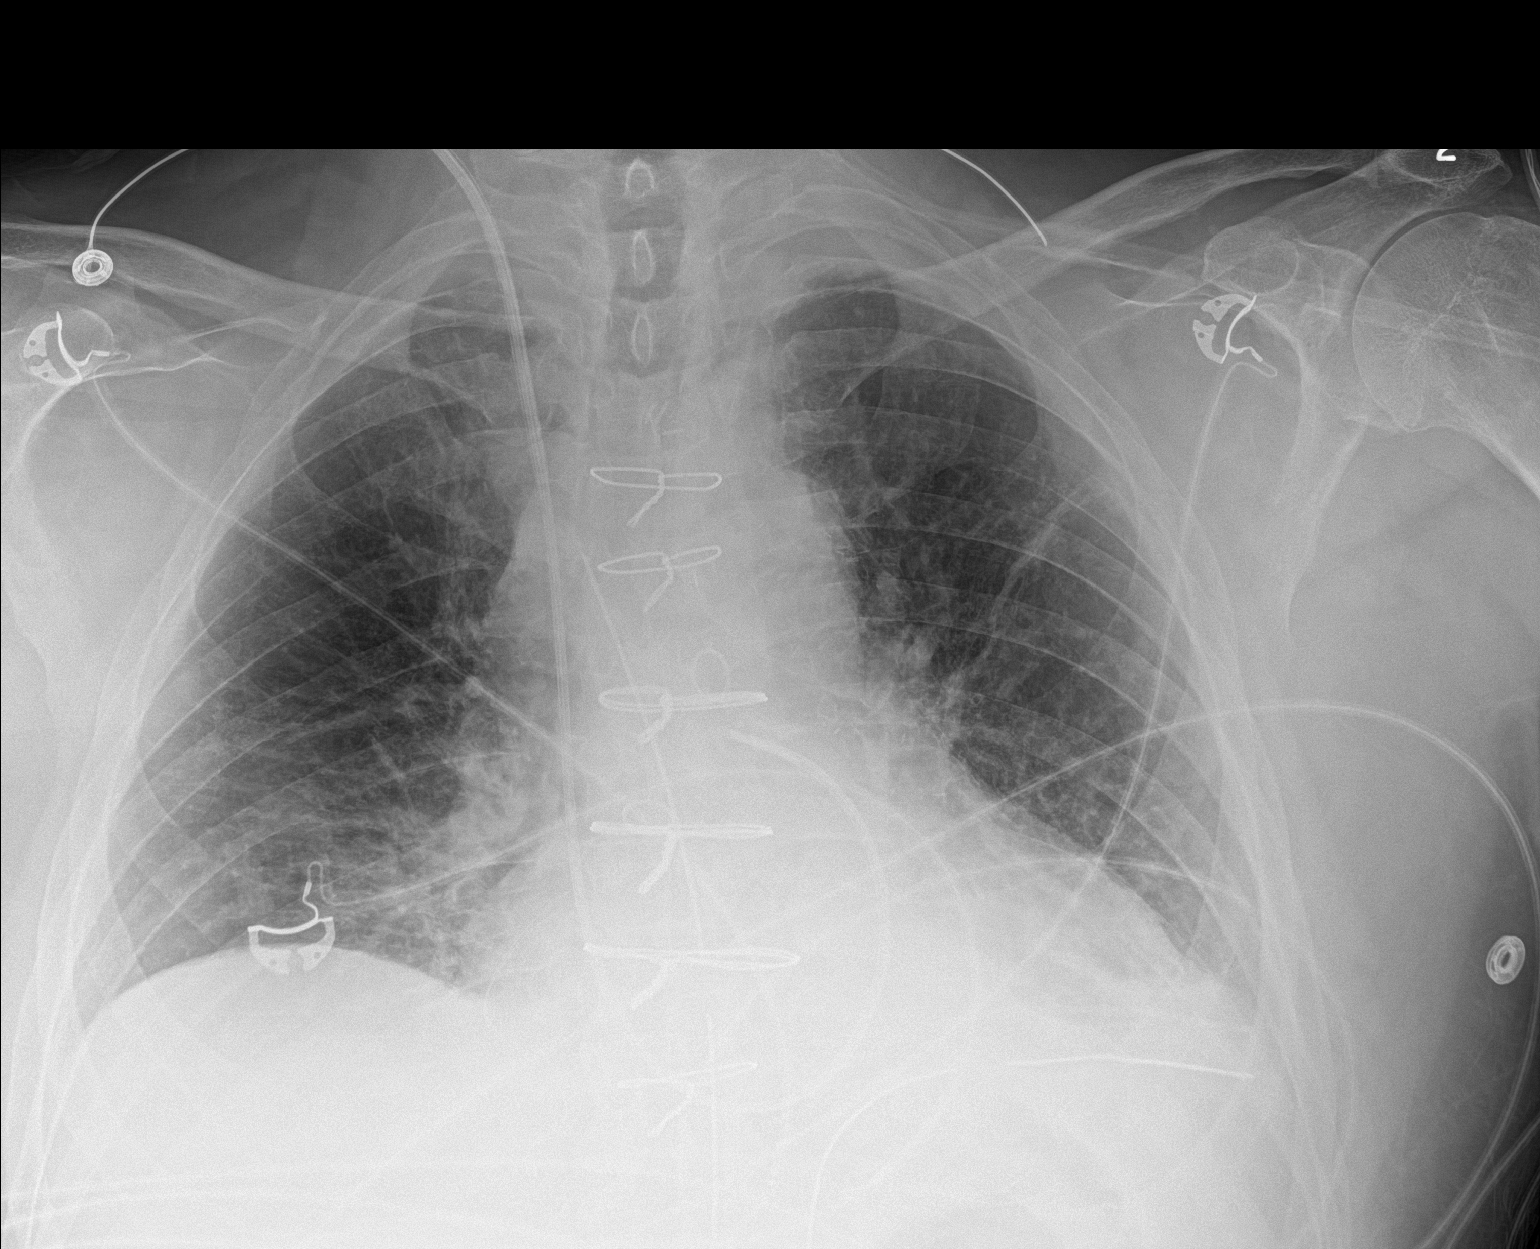

[1 of 1 positions shown; findings below may reference images not displayed]

FINDINGS: Sequelae of CABG are again identified. Endotracheal and enteric
tubes have been removed. Right jugular Swan-Ganz catheter remains in
terminates over the main pulmonary outflow tract. A mediastinal
drain and left basilar chest tube remain in place. The cardiac
silhouette is mildly enlarged. Lung volumes remain mildly diminished
with similar appearance of mild streaky left basilar opacity. No
pneumothorax or sizable pleural effusion is identified.
IMPRESSION: 1. Interval extubation. Other support devices as above. No
pneumothorax.
2. Persistent left basilar atelectasis.

## 2017-07-30 IMAGING — DX DG CHEST 2V
2 series · 2 of 2 positions shown · non-contrast
Comparison: 04/11/2017.  02/16/2017.

CLINICAL DATA: CABG.

EXAM:
CHEST  2 VIEW

[chest lat]
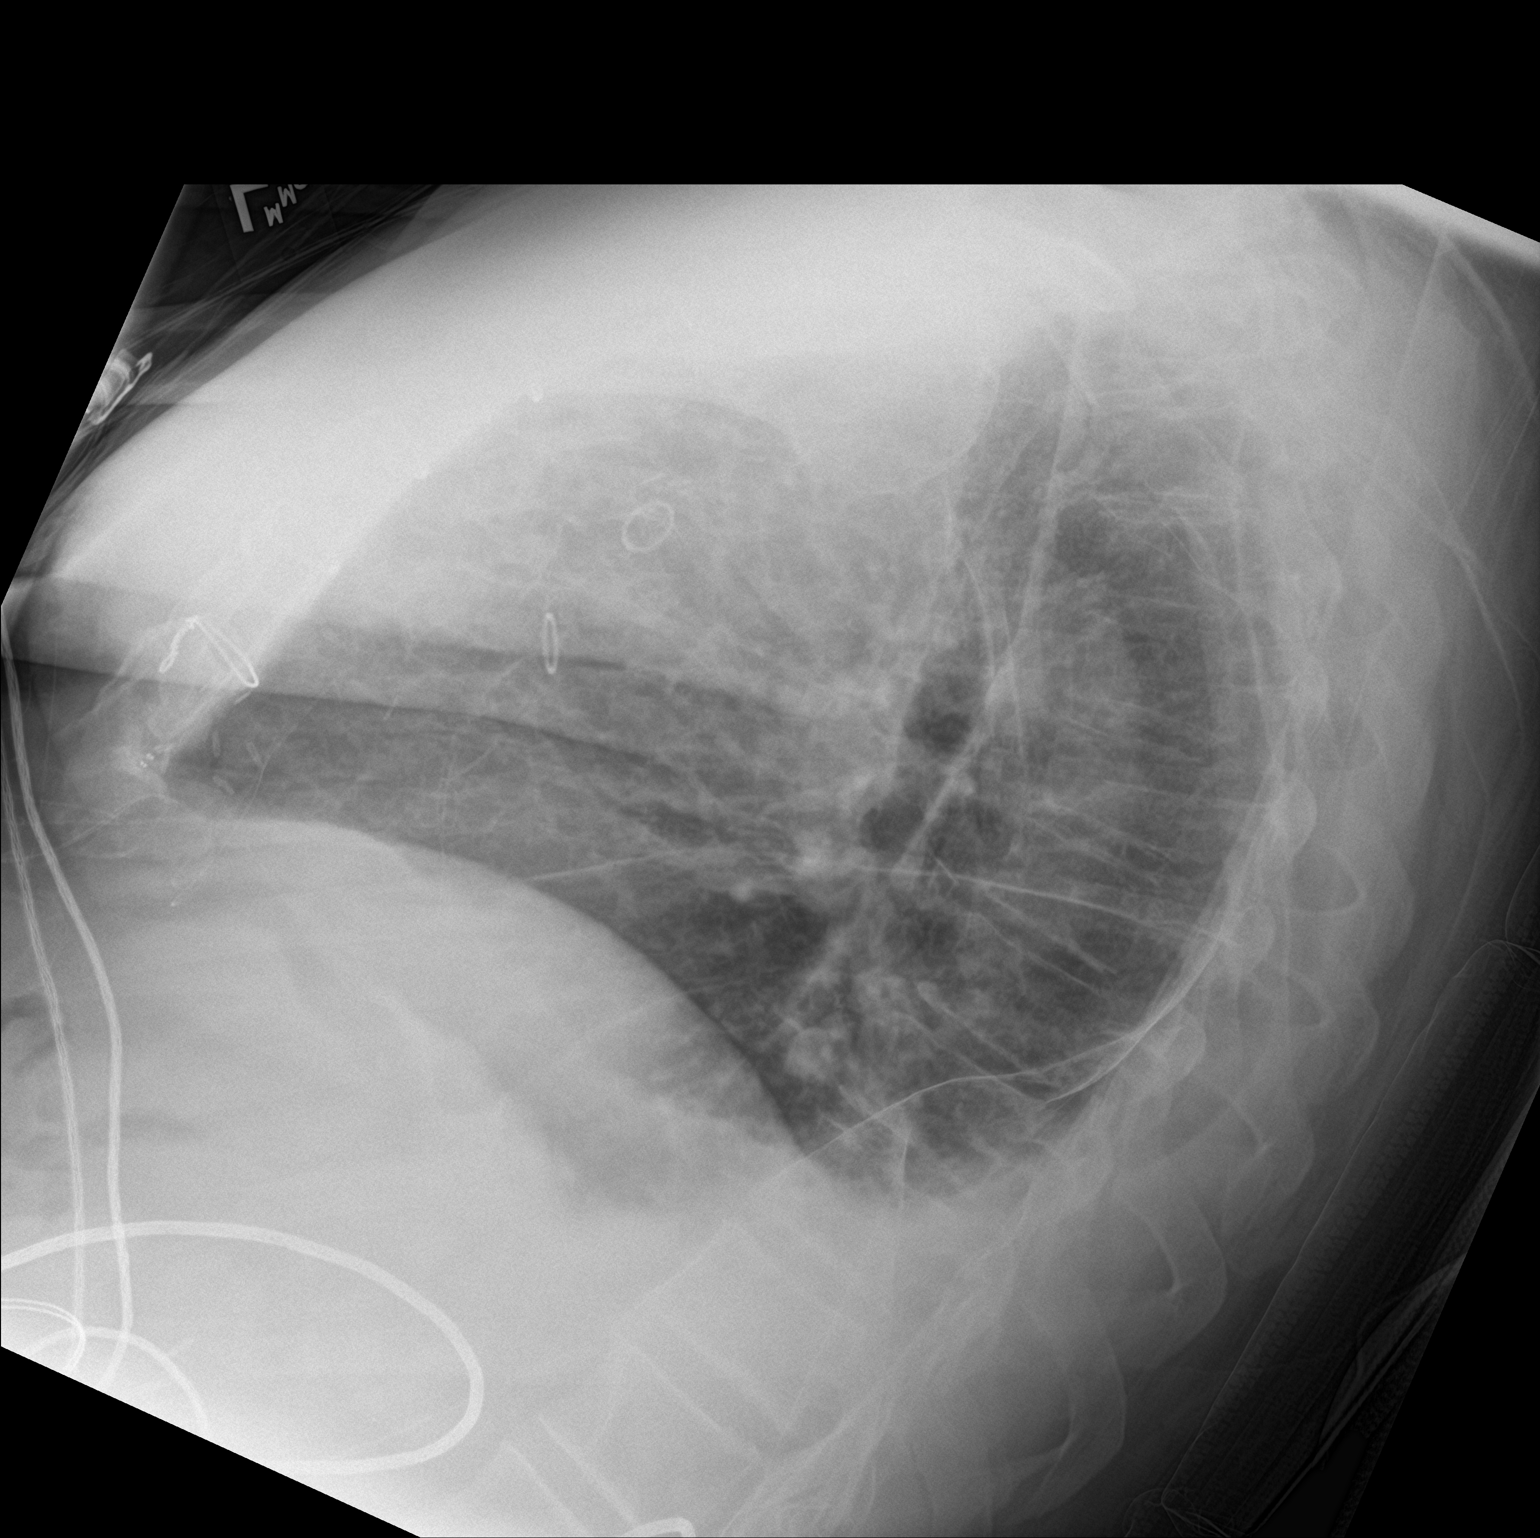

[chest ap]
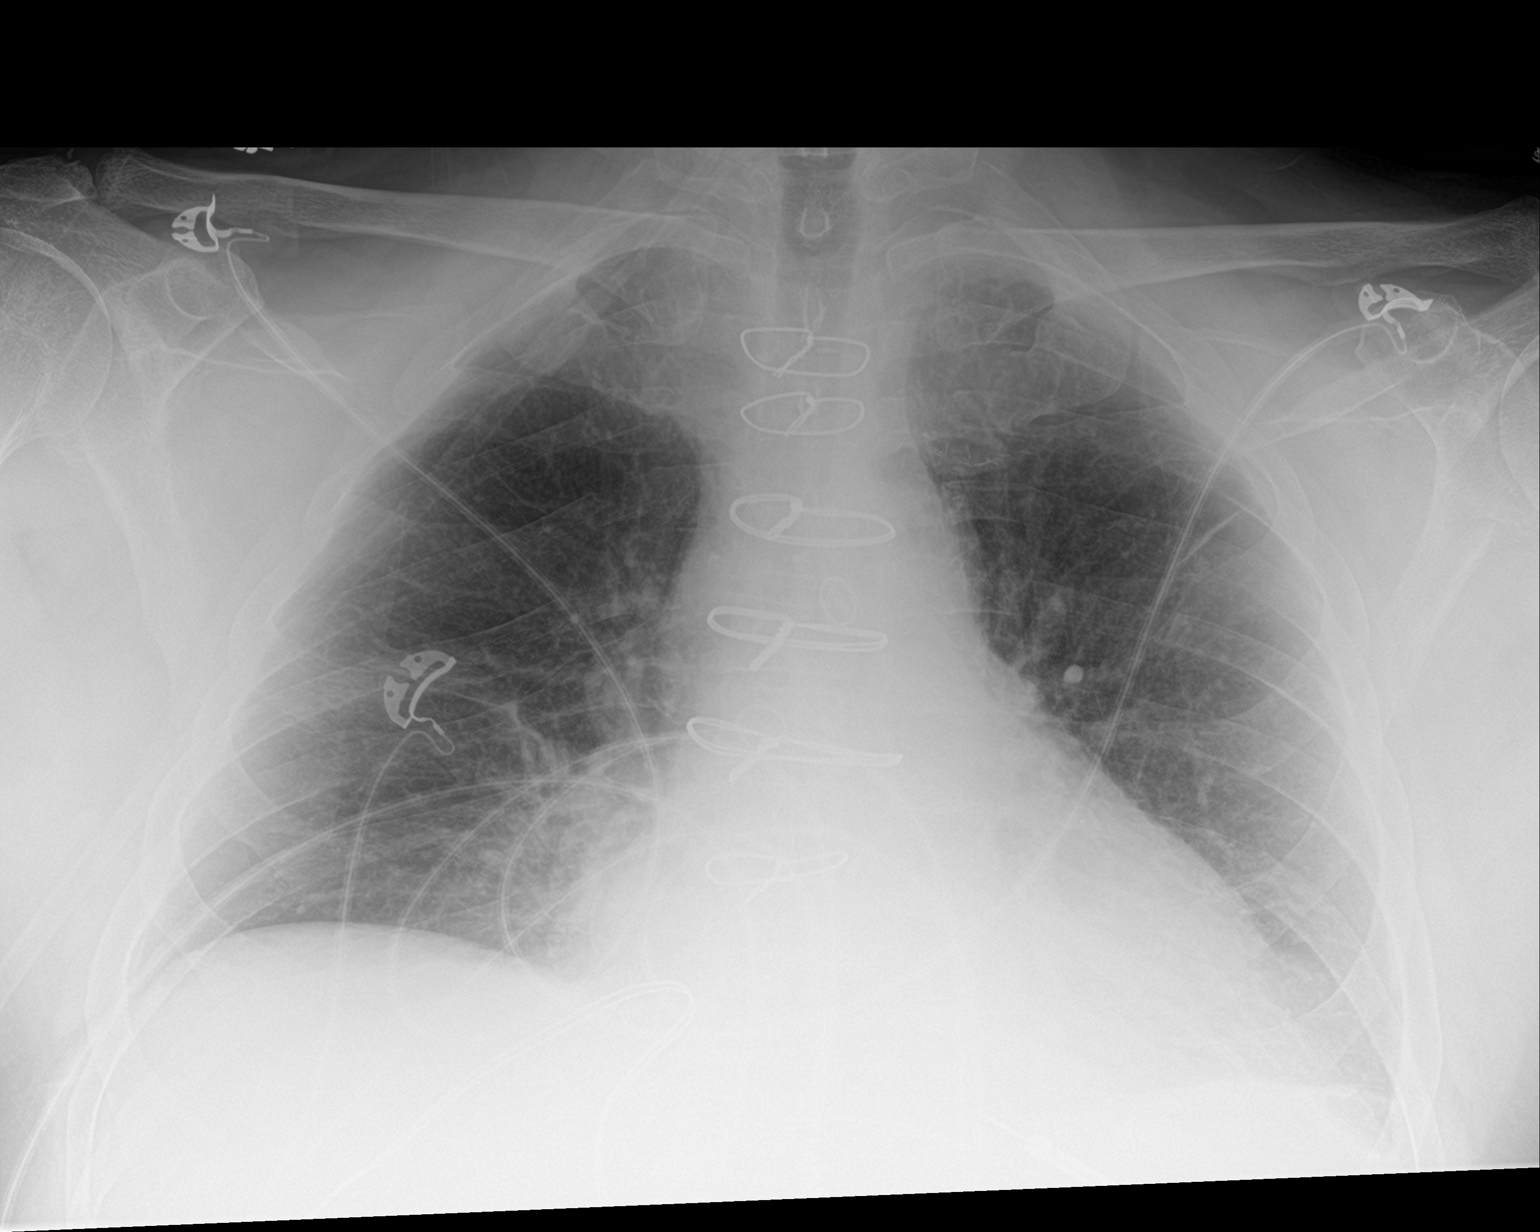

[2 of 2 positions shown; findings below may reference images not displayed]

FINDINGS: Interim removal of right IJ sheath. Prior CABG. Cardiomegaly with
interim near complete resolution of interstitial prominence
suggesting clearing CHF. Low lung volumes with basilar atelectasis.
No prominent pleural effusion or pneumothorax
IMPRESSION: 1. Interim removal of right IJ sheath.

2. Prior CABG. Cardiomegaly with near complete resolution of
interstitial edema. Low lung volumes with basilar atelectasis .

## 2017-08-14 ENCOUNTER — Encounter: Payer: Self-pay | Admitting: Vascular Surgery

## 2017-08-14 ENCOUNTER — Ambulatory Visit (INDEPENDENT_AMBULATORY_CARE_PROVIDER_SITE_OTHER): Payer: Medicare Other | Admitting: Vascular Surgery

## 2017-08-14 ENCOUNTER — Ambulatory Visit (HOSPITAL_COMMUNITY)
Admission: RE | Admit: 2017-08-14 | Discharge: 2017-08-14 | Disposition: A | Discharge status: Home / Self Care | Payer: Medicare Other | Source: Ambulatory Visit | Attending: Vascular Surgery | Admitting: Vascular Surgery

## 2017-08-14 VITALS — BP 136/80 | HR 68 | Resp 21 | Ht 73.0 in | Wt 251.8 lb

## 2017-08-14 DIAGNOSIS — I708 Atherosclerosis of other arteries: Secondary | ICD-10-CM | POA: Diagnosis not present

## 2017-08-14 DIAGNOSIS — I6523 Occlusion and stenosis of bilateral carotid arteries: Secondary | ICD-10-CM | POA: Insufficient documentation

## 2017-08-14 LAB — VAS US CAROTID
LCCADSYS: -98 cm/s
LCCAPSYS: 102 cm/s
LEFT ECA DIAS: -19 cm/s
LICAPSYS: -68 cm/s
Left CCA prox dias: 19 cm/s
Left ICA dist dias: -37 cm/s
Left ICA dist sys: -116 cm/s
Left ICA prox dias: -20 cm/s
RCCADSYS: -119 cm/s
RIGHT CCA MID DIAS: 20 cm/s
RIGHT ECA DIAS: -24 cm/s
Right CCA prox dias: 20 cm/s
Right CCA prox sys: 107 cm/s

## 2017-08-14 NOTE — Progress Notes (Signed)
HISTORY AND PHYSICAL     CC:  Follow up  Requesting Provider:  [name], MD  HPI: This is a 72 y.o. male who underwent workup for CABG and was found to have a greater than 80% left carotid artery stenosis that was asymptomatic.  Prior to CABG, he underwent a left carotid endarterectomy by Dr. Scot Dock on February 15, 2017.  He did well with this and subsequently underwent CABG x 4.  He states that he has done well.  He did have an incisional infection from his open heart surgery, but this has resolved.  He denies any amaurosis fugax, speech difficulty or weakness/numbness or clumsiness unilaterally.  He quit smoking/drinking 6 months ago.    He was supposed to follow up with Dr. Scot Dock after surgery, but states that he got lost and missed his appointment.   The pt is on a statin for cholesterol management.  He takes a daily aspirin.  He is on a beta blocker for blood pressure management.  He is on oral agent for diabetes.    Past Medical History:  Diagnosis Date  . Anemia 1964  . Arthritis    "touch starting to show up in the joints" (12/11/2013)  . CAD (coronary artery disease), native coronary artery    a. Cath 01/30/17 showing multivessel CAD --> underwent CABG on 02/15/2017 with LIMA-LAD, SVG-PDA, and SVG-Ramus-OM1.   . Carotid artery calcification    a. 01/2017: s/p L CEA performed at time of CABG; Existing Mod-Severe R Carotid stenosis  . COPD (chronic obstructive pulmonary disease) ([place])   . Diabetes ([place])   . Dyspnea    with exertion  . Former heavy tobacco smoker   . GERD (gastroesophageal reflux disease)    Dr. Michail Sermon  . History of alcohol abuse   . Hyperlipemia     Past Surgical History:  Procedure Laterality Date  . APPENDECTOMY  12/11/2013   "laparoscopic"  . BRONCHOSCOPY    . CHOLECYSTECTOMY  ~ 2010  . CORONARY ARTERY BYPASS GRAFT N/A 02/15/2017   Procedure: CORONARY ARTERY BYPASS GRAFTING (CABG) x4 :LAD to  LIMA, SVG to PDA , Sequential SVG to OM1 and Ramus;   Surgeon: Melrose Nakayama, MD;  Location: [facility];  Service: Open Heart Surgery;  Laterality: N/A;  . ENDARTERECTOMY Left 02/15/2017   Procedure: LEFT ENDARTERECTOMY CAROTID;  Surgeon: Angelia Mould, MD;  Location: [facility];  Service: Vascular;  Laterality: Left;  . LAPAROSCOPIC APPENDECTOMY N/A 12/11/2013   Procedure: APPENDECTOMY LAPAROSCOPIC;  Surgeon: Imogene Burn. Georgette Dover, MD;  Location: [facility];  Service: General;  Laterality: N/A;  . RIGHT/LEFT HEART CATH AND CORONARY ANGIOGRAPHY N/A 01/30/2017   Procedure: Right/Left Heart Cath and Coronary Angiography;  Surgeon: Troy Sine, MD;  Location: [facility];  Service: Cardiovascular: Normal RHC pressures.  Significant multivessel CAD with 50-60% smooth d-LM, 40% mid LAD. 50% mid LAD stenoses; RI --70% ostial & 80% prox RI; 90% pCx w/  80% and 70% distal; 80% oxt-prox & 90% prox-mid -> 100% CTO distal mid RCA w/ L-R collaterals  . TEE WITHOUT CARDIOVERSION N/A 02/15/2017   Procedure: TRANSESOPHAGEAL ECHOCARDIOGRAM (TEE);  Surgeon: Melrose Nakayama, MD;  Location: [facility] OR;  Service: Open Heart Surgery: Vigorous wall motion.  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  Mild MR noted.  . TONSILLECTOMY AND ADENOIDECTOMY  1950's  . 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  Otherwise normal  cavity size.  Mild LVH.  EF 60-65%.  GR 1 DD.  Mild MR.    Allergies  Allergen Reactions  . Ceclor [Cefaclor] Anaphylaxis, Hives and Other (See Comments)    BASED ON CRITERIA FOR ANAPHYLAXIS HIVES + HYPOTENSION    Current Outpatient Medications  Medication Sig Dispense Refill  . aspirin 81 MG tablet Take 1 tablet (81 mg total) by mouth daily. 30 tablet 6  . atorvastatin (LIPITOR) 40 MG tablet Take 1 tablet (40 mg total) by mouth at bedtime. 90 tablet 3  . bisoprolol (ZEBETA) 5 MG tablet Take 1 tablet (5 mg total) by mouth daily. 90 tablet 3    . esomeprazole (NEXIUM) 40 MG capsule Take 1 capsule (40 mg total) by mouth daily as needed (for acid reflux). 90 capsule 3  . glipiZIDE (GLUCOTROL) 5 MG tablet Take 0.5 tablets (2.5 mg total) by mouth daily before breakfast. (Patient taking differently: Take 2.5 mg by mouth daily at 3 pm. ) 30 tablet 2  . Lactobacillus Casei-Folic Acid (RESTORA RX) 60-1.25 MG CAPS Take 1 tablet by mouth as needed.     No current facility-administered medications for this visit.     Family History  Problem Relation Age of Onset  . Rheum arthritis Mother   . Cancer Father        BONE MARROW    Social History   Socioeconomic History  . Marital status: Divorced    Spouse name: Not on file  . Number of children: Not on file  . Years of education: Not on file  . Highest education level: Not on file  Social Needs  . Financial resource strain: Not on file  . Food insecurity - worry: Not on file  . Food insecurity - inability: Not on file  . Transportation needs - medical: Not on file  . Transportation needs - non-medical: Not on file  Occupational History  . Occupation: Engineer, structural    Comment: Retired from [place], [place]  Tobacco Use  . Smoking status: Former Smoker    Packs/day: 2.00    Years: 52.00    Pack years: 104.00    Types: Cigarettes    Last attempt to quit: 09/21/2016    Years since quitting: 0.8  . Smokeless tobacco: Never Used  Substance and Sexual Activity  . Alcohol use: No    Alcohol/week: 12.0 oz    Types: 20 Glasses of wine per week  . Drug use: No  . Sexual activity: No  Other Topics Concern  . 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     He lives by himself, having been divorced. He has only the one daughter and 2 grandchildren.   Does not routinely exercise.      He quit smoking along with alcohol - March 2018     REVIEW OF SYSTEMS:   [X]   denotes positive finding, [ ]  denotes negative finding Cardiac  Comments:  Chest pain or chest pressure:    Shortness of breath upon exertion:    Short of breath when lying flat:    Irregular heart rhythm:        Vascular    Pain in calf, thigh, or hip brought on by ambulation:    Pain in feet at night that wakes you up from your sleep:     Blood clot  in your veins:    Leg swelling:         Pulmonary    Oxygen at home:    Productive cough:     Wheezing:         Neurologic    Sudden weakness in arms or legs:     Sudden numbness in arms or legs:     Sudden onset of difficulty speaking or slurred speech:    Temporary loss of vision in one eye:     Problems with dizziness:         Gastrointestinal    Blood in stool:     Vomited blood:         Genitourinary    Burning when urinating:     Blood in urine:        Psychiatric    Major depression:         Hematologic    Bleeding problems:    Problems with blood clotting too easily:        Skin    Rashes or ulcers:        Constitutional    Fever or chills:      PHYSICAL EXAMINATION:  Vitals:   08/14/17 1430 08/14/17 1433  BP: 140/76 136/80  Pulse: 68   Resp: (!) 21   SpO2: 96%    Body mass index is 33.22 kg/m.  General:  WDWN in NAD; vital signs documented above Gait: Not observed HENT: WNL, normocephalic Pulmonary: normal non-labored breathing , without Rales, rhonchi,  wheezing Cardiac: regular HR, without  Murmurs, rubs or gallops; with right carotid bruit  Abdomen: soft, NT, no masses Skin: without rashes Vascular Exam/Pulses:  Right Left  Radial 2+ (normal) 2+ (normal)  Femoral 2+ (normal) 2+ (normal)  Popliteal Unable to palpate  Unable to palpate   DP Unable to palpate  Unable to palpate   PT 1+ (weak) 1+ (weak)   Extremities: without ischemic changes, without Gangrene , without cellulitis; without open wounds;  Musculoskeletal: no muscle wasting or atrophy  Neurologic: A&O X 3;  No focal  weakness or paresthesias are detected Psychiatric:  The pt has Normal affect.   Non-Invasive Vascular Imaging:   Carotid Duplex on 08/14/17: Right:  Velocities right ICA 60-79% stenosis Left:  Velocities left ICA 1-39% stenosis-left CEA patent  Pt meds includes: Statin:  Yes.   Beta Blocker:  Yes.   Aspirin:  Yes.   ACEI:  No. ARB:  No. CCB use:  No Other Antiplatelet/Anticoagulant:  No   ASSESSMENT/PLAN:: 72 y.o. male who is s/p left carotid endarterectomy with 60-79% stenosis of the right ICA.   -pt remains asymptomatic.  His right ICA stenosis is at 60-79%.  Will repeat his carotid duplex in 6 months.  He will contact us or go to the ER if he has any stroke sx, which were reviewed with the pt.   -Dr. Scot Dock discussed with the pt about eating a healthy diet with lots of vegetables and getting daily exercise.   -he will return in 6 months for repeat carotid duplex.   Leontine Locket, PA-C [facility] [phone]  Clinic MD:  Pt seen and examined with Dr. Scot Dock

## 2017-08-17 ENCOUNTER — Other Ambulatory Visit: Payer: Self-pay | Admitting: Cardiology

## 2017-08-21 IMAGING — CR DG CHEST 2V
2 series · 2 of 2 positions shown · non-contrast
Comparison: 02/18/2017.

CLINICAL DATA: CABG.  Shortness of breath.

EXAM:
CHEST  2 VIEW

[w chest pa]
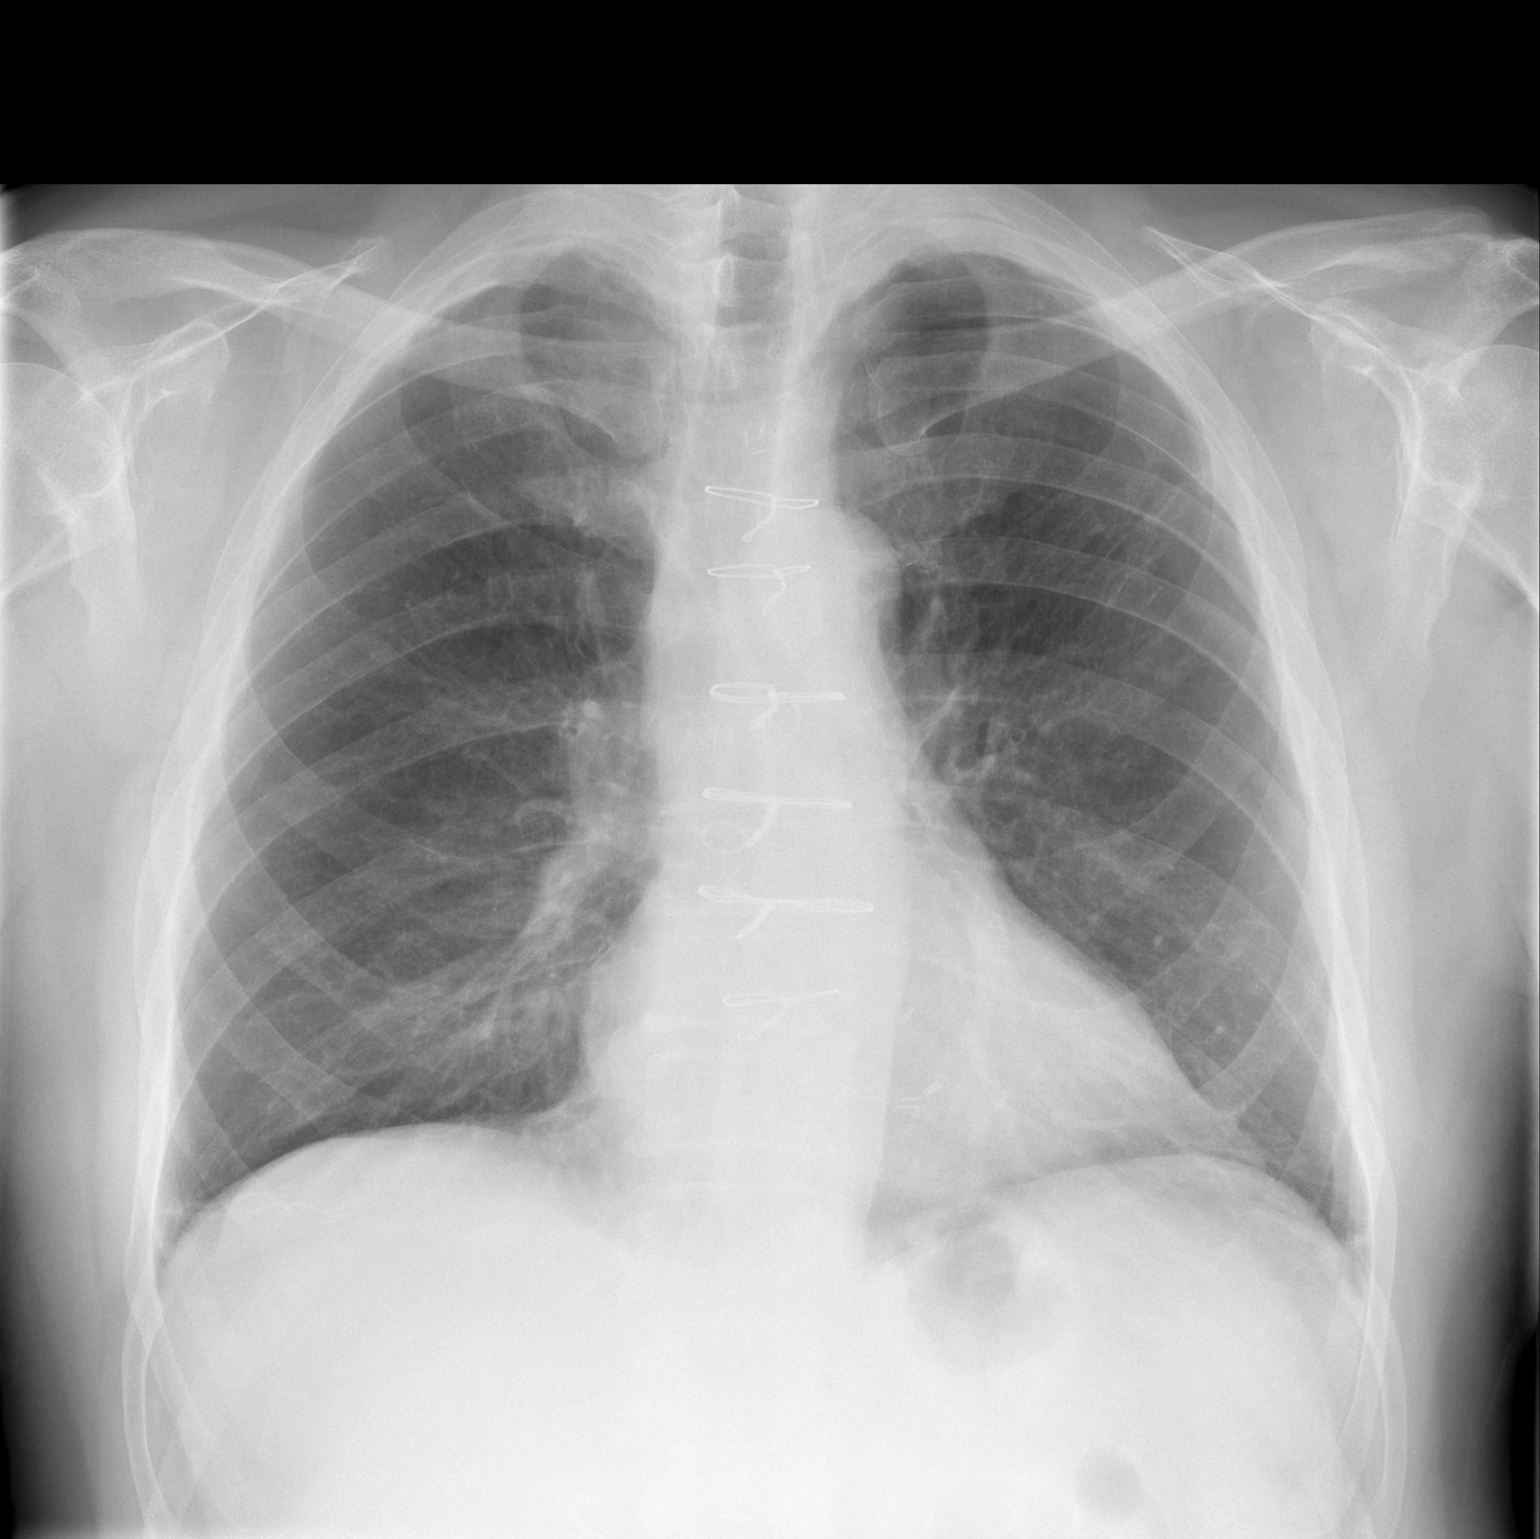

[w chest lat]
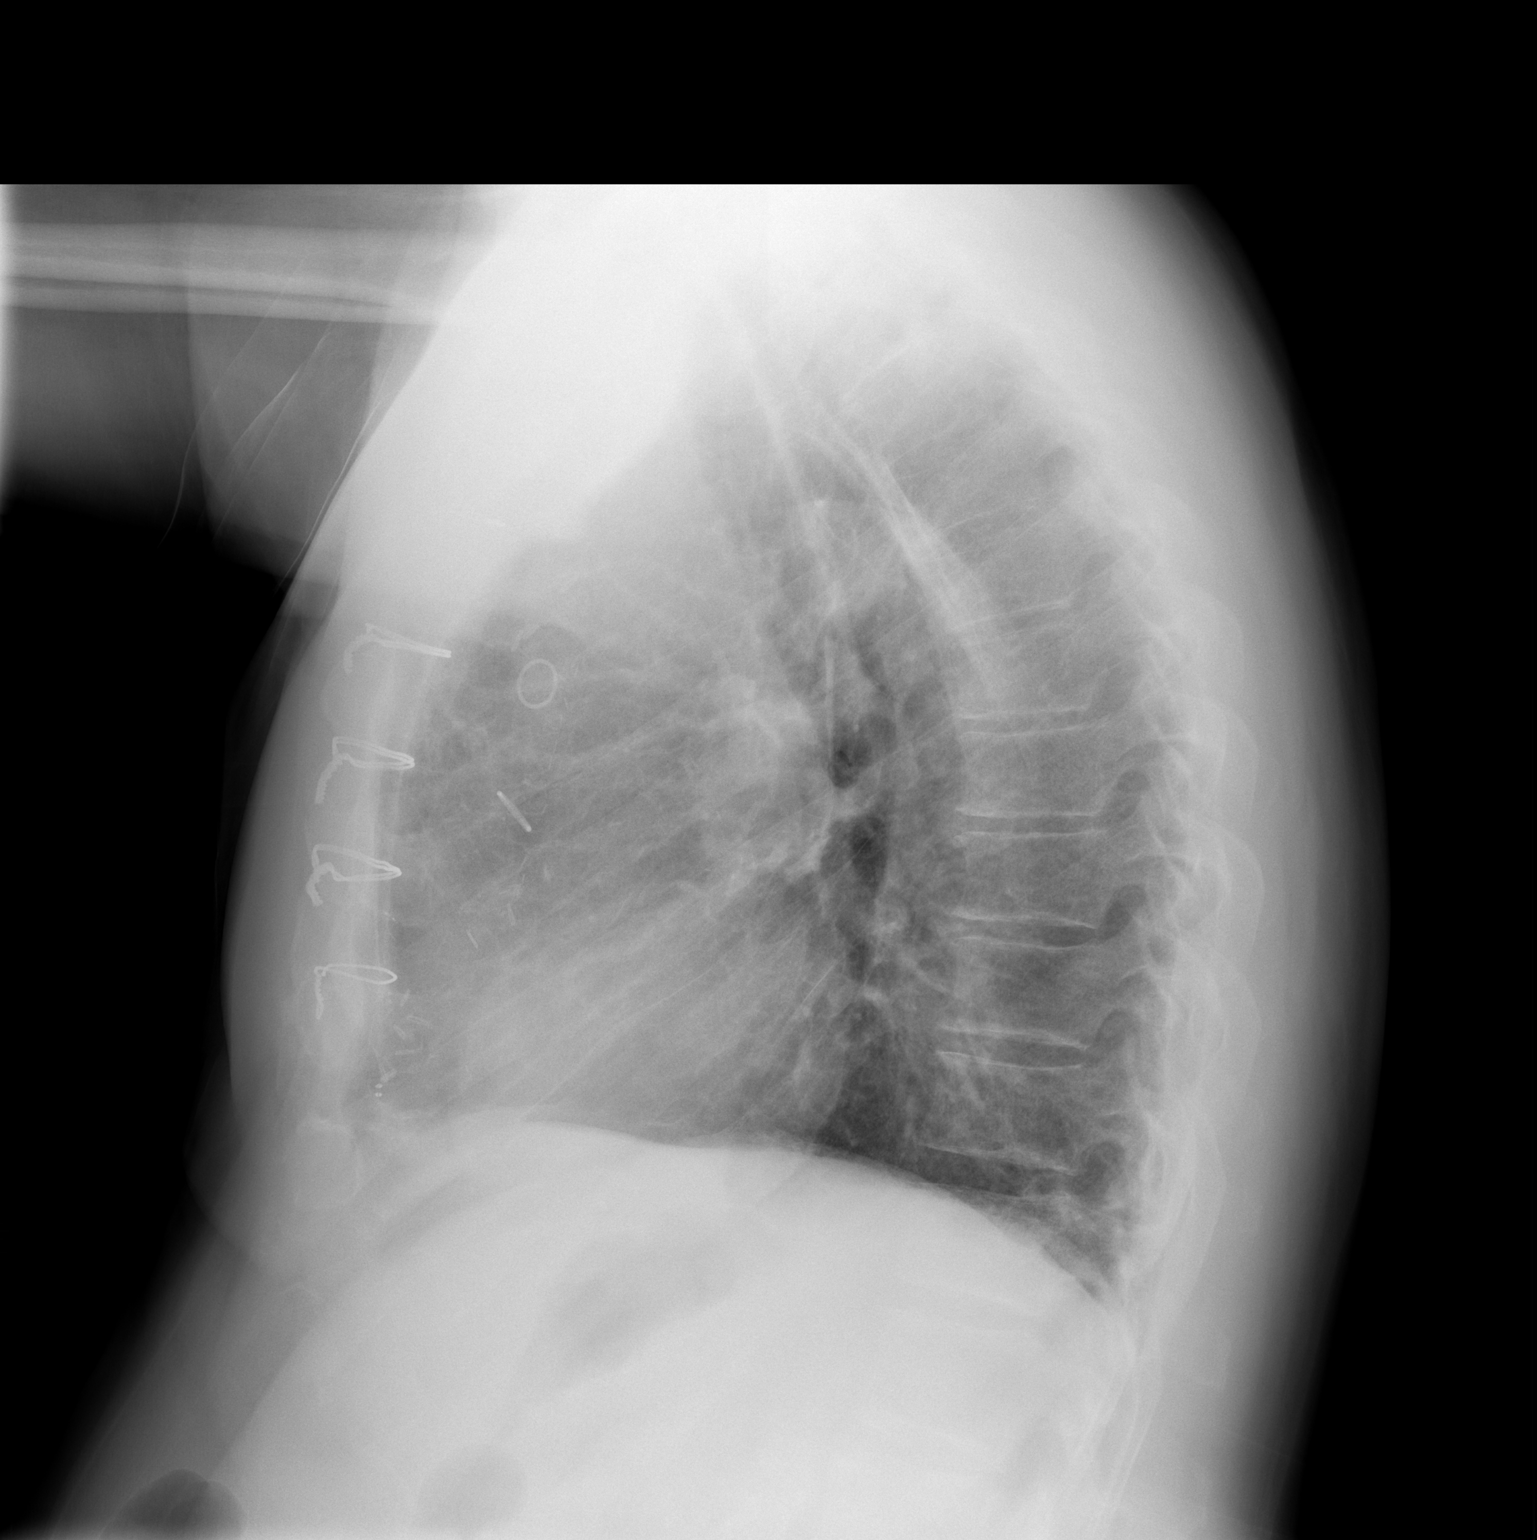

[2 of 2 positions shown; findings below may reference images not displayed]

FINDINGS: Prior CABG. Heart size stable. No pulmonary venous congestion. No
focal infiltrate. Interim resolution of pulmonary edema . Mild
basilar subsegmental atelectasis.
IMPRESSION: Prior CABG. Interim resolution of pulmonary edema. Mild bibasilar
subsegmental atelectasis .

## 2017-08-26 NOTE — Addendum Note (Signed)
Addended by: Lianne Cure A on: 08/26/2017 04:07 PM   Modules accepted: Orders

## 2017-08-27 DIAGNOSIS — E782 Mixed hyperlipidemia: Secondary | ICD-10-CM | POA: Diagnosis not present

## 2017-08-27 DIAGNOSIS — N183 Chronic kidney disease, stage 3 (moderate): Secondary | ICD-10-CM | POA: Diagnosis not present

## 2017-08-27 DIAGNOSIS — J439 Emphysema, unspecified: Secondary | ICD-10-CM | POA: Diagnosis not present

## 2017-08-27 DIAGNOSIS — E119 Type 2 diabetes mellitus without complications: Secondary | ICD-10-CM | POA: Diagnosis not present

## 2017-08-27 DIAGNOSIS — I2510 Atherosclerotic heart disease of native coronary artery without angina pectoris: Secondary | ICD-10-CM | POA: Diagnosis not present

## 2017-08-28 DIAGNOSIS — Z794 Long term (current) use of insulin: Secondary | ICD-10-CM | POA: Diagnosis not present

## 2017-08-28 DIAGNOSIS — E119 Type 2 diabetes mellitus without complications: Secondary | ICD-10-CM | POA: Diagnosis not present

## 2017-09-06 ENCOUNTER — Ambulatory Visit (INDEPENDENT_AMBULATORY_CARE_PROVIDER_SITE_OTHER): Payer: Medicare Other | Admitting: Cardiology

## 2017-09-06 ENCOUNTER — Encounter: Payer: Self-pay | Admitting: Cardiology

## 2017-09-06 VITALS — BP 122/72 | HR 78 | Ht 73.0 in | Wt 247.0 lb

## 2017-09-06 DIAGNOSIS — I2510 Atherosclerotic heart disease of native coronary artery without angina pectoris: Secondary | ICD-10-CM

## 2017-09-06 DIAGNOSIS — I739 Peripheral vascular disease, unspecified: Secondary | ICD-10-CM | POA: Diagnosis not present

## 2017-09-06 DIAGNOSIS — I10 Essential (primary) hypertension: Secondary | ICD-10-CM

## 2017-09-06 DIAGNOSIS — E785 Hyperlipidemia, unspecified: Secondary | ICD-10-CM | POA: Diagnosis not present

## 2017-09-06 DIAGNOSIS — I200 Unstable angina: Secondary | ICD-10-CM

## 2017-09-06 DIAGNOSIS — I6523 Occlusion and stenosis of bilateral carotid arteries: Secondary | ICD-10-CM | POA: Diagnosis not present

## 2017-09-06 NOTE — Progress Notes (Signed)
PCP: [name], MD  Clinic Note: Chief Complaint  Patient presents with  . Follow-up    Post CABG  . Coronary Artery Disease    Carotid and PAD    HPI: Paul Mcneil is a 72 y.o. male with a PMH below who presents today for his 3rd 29-month follow-up for CAD-CABG. 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 He quit smoking in March 2018 --> ago along with alcohol in last 2017.  Initial consult in June 2018 for atypical anginal chest pain.  Referred for Myoview stress test that was abnormal.  Underwent cardiac catheterization showing severe CAD and referred for CABG.  CABG + L CEA -  02/15/2017: LIMA to LAD, SVG to PDA, SVG to Ramus and OM1 w/ Left Carotid CEA. (Dr. Roxan Hockey & Dr. Scot Dock)  Postop course in the outpatient setting complicated by superficial wound infection treated with debridement and oral antibiotics. -->  Now fully healed  Also noted to have 60-79% right ICA disease.  Followed by Dr. Jadene Pierini was last seen on June 06, 2017  for initial follow-up visit with me.  He was doing quite well.  His sternal wound seem to been healing well.  His energy level back to where it had been several years earlier.  No longer noting leg fatigue.  Exertional dyspnea chest discomfort no longer present.Clarnce Flock Dr. Koleen Nimrod on June 17, 2017: Some mild issues with the surgical wound, plan to treat with local wound care.  Patient opted on this route as opposed to operative excision and closure.  -->  Plan wet-to-dry dressing.  -->  Wound follow-up in December 2018 showed well-healed sternal incision with no evidence of infection and a stable sternum.  Saw Dr. Nicole Cella PA on January 23.  Was doing well.  Plan was to follow-up the right carotid in 6 months.  Healthy diet regimen recommended Plan discussed  Recent Hospitalizations:  No new hospitalizations   Studies Personally  Reviewed - (if available, images/films reviewed: From Epic Chart or Care Everywhere)  Carotid Dopplers August 14, 2017: RICA 60-79%. L ICA s/p CEA - < 39%.  Right vertebral artery with patent antegrade flow.  Partial left subclavian steal noted.  Left subclavian artery is stenotic.  Right subclavian flow normal.  Interval History: Paul Mcneil returns today overall doing quite well from a cardiac standpoint.  He has essentially been released by cardiac surgery now doing fairly well.  He is doing more walking and overall getting more active.  He is in the process of waiting to see a nephrologist for worsening renal function, although he denies any change in urine output. He has not had any further episodes of chest tightness or pressure since I last saw him.  Energy level has improved, and he is doing well with his smoking cessation. He has some mild edema stating that his socks get "tight at bedtime.  But otherwise no real PND orthopnea. He denies any rapid irregular heartbeats or palpitations.  No syncope/near syncope or TIA/amaurosis fugax. He also still notes that his leg aching and discomfort has notably improved since his CABG.  ROS: A comprehensive was performed. Review of Systems  Constitutional: Negative for fever and malaise/fatigue.  HENT: Negative for congestion and nosebleeds.   Respiratory: Positive for cough (Improved morning cough).   Gastrointestinal: Negative for blood in stool and melena.  Genitourinary: Negative for  dysuria and hematuria.  Musculoskeletal: Positive for back pain and joint pain (Hips and knees).  Skin: Negative.        Sternal wound seems to be fully healed now.  Neurological: Negative for dizziness.  Endo/Heme/Allergies: Does not bruise/bleed easily.  Psychiatric/Behavioral: Negative for memory loss. The patient is not nervous/anxious.   All other systems reviewed and are negative.  I have reviewed and (if needed) personally updated the patient's problem  list, medications, allergies, past medical and surgical history, social and family history.   Past Medical History:  Diagnosis Date  . Anemia 1964  . Arthritis    "touch starting to show up in the joints" (12/11/2013)  . CAD (coronary artery disease), native coronary artery    a. Cath 01/30/17 showing multivessel CAD --> underwent CABG on 02/15/2017 with LIMA-LAD, SVG-PDA, and SVG-Ramus-OM1.   . Carotid artery calcification    a. 01/2017: s/p L CEA performed at time of CABG; Existing Mod-Severe R Carotid stenosis  . COPD (chronic obstructive pulmonary disease) ([place])   . Diabetes ([place])   . Dyspnea    with exertion  . Former heavy tobacco smoker   . GERD (gastroesophageal reflux disease)    Dr. Michail Sermon  . History of alcohol abuse   . Hyperlipemia     Past Surgical History:  Procedure Laterality Date  . APPENDECTOMY  12/11/2013   "laparoscopic"  . BRONCHOSCOPY    . CHOLECYSTECTOMY  ~ 2010  . CORONARY ARTERY BYPASS GRAFT N/A 02/15/2017   Procedure: CORONARY ARTERY BYPASS GRAFTING (CABG) x4 :LAD to  LIMA, SVG to PDA , Sequential SVG to OM1 and Ramus;  Surgeon: Melrose Nakayama, MD;  Location: [facility];  Service: Open Heart Surgery;  Laterality: N/A;  . ENDARTERECTOMY Left 02/15/2017   Procedure: LEFT ENDARTERECTOMY CAROTID;  Surgeon: Angelia Mould, MD;  Location: [facility];  Service: Vascular;  Laterality: Left;  . LAPAROSCOPIC APPENDECTOMY N/A 12/11/2013   Procedure: APPENDECTOMY LAPAROSCOPIC;  Surgeon: Imogene Burn. Georgette Dover, MD;  Location: [facility];  Service: General;  Laterality: N/A;  . RIGHT/LEFT HEART CATH AND CORONARY ANGIOGRAPHY N/A 01/30/2017   Procedure: Right/Left Heart Cath and Coronary Angiography;  Surgeon: Troy Sine, MD;  Location: [facility];  Service: Cardiovascular: Normal RHC pressures.  Significant multivessel CAD with 50-60% smooth d-LM, 40% mid LAD. 50% mid LAD stenoses; RI --70% ostial & 80% prox RI; 90% pCx w/  80% and 70% distal; 80% oxt-prox & 90%  prox-mid -> 100% CTO distal mid RCA w/ L-R collaterals  . TEE WITHOUT CARDIOVERSION N/A 02/15/2017   Procedure: TRANSESOPHAGEAL ECHOCARDIOGRAM (TEE);  Surgeon: Melrose Nakayama, MD;  Location: [facility] OR;  Service: Open Heart Surgery: Vigorous wall motion.  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  Mild MR noted.  . TONSILLECTOMY AND ADENOIDECTOMY  1950's  . 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  Otherwise normal cavity size.  Mild LVH.  EF 60-65%.  GR 1 DD.  Mild MR.    Right And Left Heart Cath 01/30/2017:  Normal right heart pressures.       Current Meds  Medication Sig  . aspirin 81 MG tablet Take 1 tablet (81 mg total) by mouth daily.  Marland Kitchen atorvastatin (LIPITOR) 40 MG tablet Take 1 tablet (40 mg total) by mouth at bedtime.  . bisoprolol (ZEBETA) 5 MG tablet Take 1 tablet (5 mg total) by mouth daily.  Marland Kitchen esomeprazole ([place])  40 MG capsule Take 1 capsule (40 mg total) by mouth daily as needed (for acid reflux).  . Lactobacillus Casei-Folic Acid (RESTORA RX) 60-1.25 MG CAPS Take 1 tablet by mouth as needed.  Marland Kitchen LEVEMIR FLEXTOUCH 100 UNIT/ML Pen     Allergies  Allergen Reactions  . Ceclor [Cefaclor] Anaphylaxis, Hives and Other (See Comments)    BASED ON CRITERIA FOR ANAPHYLAXIS HIVES + HYPOTENSION    Social History   Socioeconomic History  . Marital status: Divorced    Spouse name: None  . Number of children: None  . Years of education: None  . Highest education level: None  Social Needs  . Financial resource strain: None  . Food insecurity - worry: None  . Food insecurity - inability: None  . Transportation needs - medical: None  . Transportation needs - non-medical: None  Occupational History  . 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 Smoking status: Former Smoker    Packs/day: 2.00    Years: 52.00    Pack  years: 104.00    Types: Cigarettes    Last attempt to quit: 09/21/2016    Years since quitting: 0.9  . Smokeless tobacco: Never Used  Substance and Sexual Activity  . Alcohol use: No    Alcohol/week: 12.0 oz    Types: 20 Glasses of wine per week  . Drug use: No  . Sexual activity: No  Other Topics Concern  . 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     He lives by himself, having been divorced. He has only the one daughter and 2 grandchildren.   Does not routinely exercise.      He quit smoking along with alcohol - March 2018    family history includes Cancer in his father; Rheum arthritis in his mother.  Wt Readings from Last 3 Encounters:  09/06/17 247 lb (112 kg)  08/14/17 251 lb 12.8 oz (114.2 kg)  07/09/17 230 lb (104.3 kg)    PHYSICAL EXAM BP 122/72   Pulse 78   Ht 6\' 1"  (1.854 m)   Wt 247 lb (112 kg)   BMI 32.59 kg/m  Physical Exam  Constitutional: He is oriented to person, place, and time. He appears well-developed and well-nourished. No distress.  HENT:  Head: Normocephalic and atraumatic.  Neck: No hepatojugular reflux and no JVD present. Carotid bruit is not present. No thyromegaly present.  Cardiovascular: Normal rate, regular rhythm, normal heart sounds and intact distal pulses. Exam reveals no gallop and no friction rub.  No murmur heard. Nondisplaced PMI.  Well-healed sternotomy scar with a focal area in the upper portion of the incision that has a small indentation, but is well-healed.  Pulmonary/Chest: Effort normal and breath sounds normal. No respiratory distress. He has no rales.  Abdominal: Soft. Bowel sounds are normal. He exhibits no distension. There is no tenderness. There is no rebound.  Musculoskeletal: Normal range of motion. He exhibits no edema (trivial).  Neurological: He is alert and oriented to person, place, and time.  Skin:  Skin is warm and dry.  Psychiatric: He has a normal mood and affect. His behavior is normal. Judgment and thought content normal.  Nursing note and vitals reviewed.    Adult ECG Report Not checked  Other studies Reviewed: Additional studies/ records that were  reviewed today include:  Recent Labs:  --Reviewed:  TC 155, TG 447 (high), HDL 25, LDL 83  LFTs normal.  BUN / Cr 28/2.66.   ASSESSMENT / PLAN:   Mr. Launer seems to be doing very well after his CABG and carotid endarterectomy.  His wound has completely healed now. Lipids do not look as good as they were before with triglycerides still being high.  His blood pressure is well controlled, and he is not having any arrhythmias.  He would like an opportunity to work on his diet and exercise routine to see if he can potentially lower his lipids and triglycerides that way.  If not, would likely need additional management -for instance adding Vascepa to his statin.   Plan for now will be to continue risk factor modification.  Continue to increase activity level/exercise  -He will continue to follow with vascular surgery for his carotid disease/subclavian disease and assessment of lower extremity arterial disease.  He had claudication symptoms that seem to have resolved post CABG.  Problem List Items Addressed This Visit    CAD, multiple vessel - Primary (Chronic)    He had progressive exertional angina and dyspnea vessel CAD on cath.  Referred for CABG and now relatively asymptomatic post CABG. Starting to get back to full activity. On aspirin, statin and beta-blocker.  Continue full activity.      Carotid artery disease (HCC) (Chronic)     Bilateral carotid disease status post left CEA with moderate disease in the right.  Being followed by vascular surgery.  He also has some evidence of subclavian disease and potentially PAD.  Will defer to vascular surgery For timing of follow-up evaluation.  Otherwise continue cardiovascular  risk factor modification.       Claudication of both lower extremities (HCC)   Dyslipidemia, goal LDL below 70 (Chronic)    Last labs showed LDL not at goal unlike the most recent labs before that.  He indicates that he probably has been eating as well as he should.  Wants to try to work on his diet and exercise, especially since he is now working on reducing diabetes risk. Needs lipid recheck in roughly 3 months.  If still elevated, we will add Vascepa due to high triglycerides.      Relevant Orders   Lipid panel   Essential hypertension (Chronic)    Blood pressure looks good on current dose of beta-blocker.      Unstable angina (HCC) (Chronic)    No further episodes of progressive/unstable angina since CABG. On beta-blocker aspirin and statin.  Not requiring nitroglycerin.         Current medicines are reviewed at length with the patient today. (+/- concerns) n/a The following changes have been made: n/a  Patient Instructions  No change with current medications     labs needed in 3 months with primary,please have Lipid  Will mail you labslip    Your physician wants you to follow-up in 6 months with DR Shanyla Marconi. You will receive a reminder letter in the mail two months in advance. If you don't receive a letter, please call our office to schedule the follow-up appointment.    Studies Ordered:   Orders Placed This Encounter  Procedures  . Lipid panel      Glenetta Hew, M.D., M.S. Interventional Cardiologist   Pager # [phone] Phone # [phone] [address] [place], [place] [address]

## 2017-09-06 NOTE — Patient Instructions (Addendum)
No change with current medications     labs needed in 3 months with primary,please have Lipid  Will mail you labslip    Your physician wants you to follow-up in 6 months with DR HARDING. You will receive a reminder letter in the mail two months in advance. If you don't receive a letter, please call our office to schedule the follow-up appointment.

## 2017-09-08 ENCOUNTER — Encounter: Payer: Self-pay | Admitting: Cardiology

## 2017-09-08 NOTE — Assessment & Plan Note (Signed)
Blood pressure looks good on current dose of beta-blocker.

## 2017-09-08 NOTE — Assessment & Plan Note (Addendum)
Last labs showed LDL not at goal unlike the most recent labs before that.  He indicates that he probably has been eating as well as he should.  Wants to try to work on his diet and exercise, especially since he is now working on reducing diabetes risk. Needs lipid recheck in roughly 3 months.  If still elevated, we will add Vascepa due to high triglycerides.

## 2017-09-08 NOTE — Assessment & Plan Note (Signed)
No further episodes of progressive/unstable angina since CABG. On beta-blocker aspirin and statin.  Not requiring nitroglycerin.

## 2017-09-08 NOTE — Assessment & Plan Note (Signed)
He had progressive exertional angina and dyspnea vessel CAD on cath.  Referred for CABG and now relatively asymptomatic post CABG. Starting to get back to full activity. On aspirin, statin and beta-blocker.  Continue full activity.

## 2017-09-08 NOTE — Assessment & Plan Note (Addendum)
Bilateral carotid disease status post left CEA with moderate disease in the right.  Being followed by vascular surgery.  He also has some evidence of subclavian disease and potentially PAD.  Will defer to vascular surgery For timing of follow-up evaluation.  Otherwise continue cardiovascular risk factor modification.

## 2017-09-11 DIAGNOSIS — Z794 Long term (current) use of insulin: Secondary | ICD-10-CM | POA: Diagnosis not present

## 2017-09-11 DIAGNOSIS — E0822 Diabetes mellitus due to underlying condition with diabetic chronic kidney disease: Secondary | ICD-10-CM | POA: Diagnosis not present

## 2017-09-11 DIAGNOSIS — N184 Chronic kidney disease, stage 4 (severe): Secondary | ICD-10-CM | POA: Diagnosis not present

## 2017-09-11 DIAGNOSIS — E1165 Type 2 diabetes mellitus with hyperglycemia: Secondary | ICD-10-CM | POA: Diagnosis not present

## 2017-10-18 ENCOUNTER — Telehealth: Payer: Self-pay | Admitting: Cardiology

## 2017-10-18 MED ORDER — BISOPROLOL FUMARATE 5 MG PO TABS: 5.0000 mg | ORAL_TABLET | Freq: Every day | ORAL | 1 refills | Status: DC

## 2017-10-18 NOTE — Telephone Encounter (Signed)
°*  STAT* If patient is at the pharmacy, call can be transferred to refill team.   1. Which medications need to be refilled? (please list name of each medication and dose if known)   bisoprolol (ZEBETA) 5 MG tablet Take 1 tablet (5 mg total) by mouth daily.        2. Which pharmacy/location (including street and city if local pharmacy) is medication to be sent to? [facility] Fax [phone] Phone: [phone]  3. Do they need a 30 day or 90 day supply? 90 refills allowed

## 2017-11-04 ENCOUNTER — Telehealth: Payer: Self-pay | Admitting: *Deleted

## 2017-11-04 DIAGNOSIS — E785 Hyperlipidemia, unspecified: Secondary | ICD-10-CM

## 2017-11-04 NOTE — Telephone Encounter (Signed)
MAILED LETTER AND LABSLIP

## 2017-11-04 NOTE — Telephone Encounter (Signed)
-----   Message from Raiford Simmonds, RN sent at 09/06/2017  1:54 PM EST ----- Labs due 12/04/17 lipid only Mail @ 11/04/17 labslip , letter

## 2017-11-14 DIAGNOSIS — E785 Hyperlipidemia, unspecified: Secondary | ICD-10-CM | POA: Diagnosis not present

## 2017-11-14 LAB — LIPID PANEL
CHOL/HDL RATIO: 6.4 ratio — AB (ref 0.0–5.0)
Cholesterol, Total: 166 mg/dL (ref 100–199)
HDL: 26 mg/dL — AB (ref >39)
LDL Calculated: 100 mg/dL — ABNORMAL HIGH (ref 0–99)
Triglycerides: 201 mg/dL — ABNORMAL HIGH (ref 0–149)
VLDL CHOLESTEROL CAL: 40 mg/dL (ref 5–40)

## 2017-11-19 ENCOUNTER — Other Ambulatory Visit: Payer: Self-pay | Admitting: *Deleted

## 2017-11-19 ENCOUNTER — Telehealth: Payer: Self-pay | Admitting: *Deleted

## 2017-11-19 DIAGNOSIS — Z79899 Other long term (current) drug therapy: Secondary | ICD-10-CM

## 2017-11-19 DIAGNOSIS — I2510 Atherosclerotic heart disease of native coronary artery without angina pectoris: Secondary | ICD-10-CM

## 2017-11-19 DIAGNOSIS — E785 Hyperlipidemia, unspecified: Secondary | ICD-10-CM

## 2017-11-19 MED ORDER — ROSUVASTATIN CALCIUM 40 MG PO TABS: 40.0000 mg | ORAL_TABLET | Freq: Every day | ORAL | 3 refills | Status: DC

## 2017-11-19 NOTE — Telephone Encounter (Signed)
SPOKE TO PATIENT , RESULT GIVEN   PATIENT WILL STOP ATORVASTATIN  AND WILL START ROSUVASTATIN ONCE IT ARRIVES FROM MAIL ORDER.    WILL SEND LABSLIP  IN 3 MONTHS   PATIENT WANTED TO KNOW IF HE SHOULD GET LABS AT PRIMARY FOR HGBAIC NEXT WEEK . RN INFORMED PATIENT YES.  VERBALIZED UNDERSTANDING.

## 2017-11-19 NOTE — Telephone Encounter (Signed)
-----   Message from Leonie Man, MD sent at 11/16/2017 11:49 AM EDT ----- Previous labs showed TC 155, TG 447 (high), HDL 25, LDL 83 --> he was not at goal at that time and the plan was to adjust dietary intake and increase exercise.  Despite these efforts, his lipids have gotten worse.  Total cholesterol is up to 166, triglycerides of 201 and LDL up to 100.  At this point time I think we need to consider converting to Crestor 40 mg plus Zetia 10 mg.  Start with Crestor, recheck lipids in 3 months and then if still not at goal, add Zetia. ->   Glenetta Hew, MD   Please forward to PCP: [name], MD

## 2017-11-27 DIAGNOSIS — J439 Emphysema, unspecified: Secondary | ICD-10-CM | POA: Diagnosis not present

## 2017-11-27 DIAGNOSIS — D492 Neoplasm of unspecified behavior of bone, soft tissue, and skin: Secondary | ICD-10-CM | POA: Diagnosis not present

## 2017-11-27 DIAGNOSIS — E782 Mixed hyperlipidemia: Secondary | ICD-10-CM | POA: Diagnosis not present

## 2017-11-27 DIAGNOSIS — E1165 Type 2 diabetes mellitus with hyperglycemia: Secondary | ICD-10-CM | POA: Diagnosis not present

## 2017-11-27 DIAGNOSIS — I2510 Atherosclerotic heart disease of native coronary artery without angina pectoris: Secondary | ICD-10-CM | POA: Diagnosis not present

## 2017-11-27 DIAGNOSIS — N184 Chronic kidney disease, stage 4 (severe): Secondary | ICD-10-CM | POA: Diagnosis not present

## 2017-12-04 DIAGNOSIS — I129 Hypertensive chronic kidney disease with stage 1 through stage 4 chronic kidney disease, or unspecified chronic kidney disease: Secondary | ICD-10-CM | POA: Diagnosis not present

## 2017-12-04 DIAGNOSIS — N184 Chronic kidney disease, stage 4 (severe): Secondary | ICD-10-CM | POA: Diagnosis not present

## 2017-12-05 ENCOUNTER — Other Ambulatory Visit: Payer: Self-pay | Admitting: Nephrology

## 2017-12-05 DIAGNOSIS — I129 Hypertensive chronic kidney disease with stage 1 through stage 4 chronic kidney disease, or unspecified chronic kidney disease: Secondary | ICD-10-CM

## 2017-12-05 DIAGNOSIS — N184 Chronic kidney disease, stage 4 (severe): Secondary | ICD-10-CM

## 2017-12-09 ENCOUNTER — Other Ambulatory Visit: Payer: Medicare Other

## 2017-12-11 ENCOUNTER — Ambulatory Visit
Admission: RE | Admit: 2017-12-11 | Discharge: 2017-12-11 | Disposition: A | Discharge status: Home / Self Care | Payer: Medicare Other | Source: Ambulatory Visit | Attending: Nephrology | Admitting: Nephrology

## 2017-12-11 DIAGNOSIS — N189 Chronic kidney disease, unspecified: Secondary | ICD-10-CM | POA: Diagnosis not present

## 2017-12-11 DIAGNOSIS — N281 Cyst of kidney, acquired: Secondary | ICD-10-CM | POA: Diagnosis not present

## 2017-12-11 DIAGNOSIS — N184 Chronic kidney disease, stage 4 (severe): Secondary | ICD-10-CM

## 2017-12-11 DIAGNOSIS — I129 Hypertensive chronic kidney disease with stage 1 through stage 4 chronic kidney disease, or unspecified chronic kidney disease: Secondary | ICD-10-CM

## 2018-01-14 ENCOUNTER — Telehealth: Payer: Self-pay | Admitting: *Deleted

## 2018-01-14 ENCOUNTER — Other Ambulatory Visit: Payer: Self-pay | Admitting: *Deleted

## 2018-01-14 DIAGNOSIS — E785 Hyperlipidemia, unspecified: Secondary | ICD-10-CM

## 2018-01-14 DIAGNOSIS — Z79899 Other long term (current) drug therapy: Secondary | ICD-10-CM

## 2018-01-14 DIAGNOSIS — I2510 Atherosclerotic heart disease of native coronary artery without angina pectoris: Secondary | ICD-10-CM

## 2018-01-14 NOTE — Telephone Encounter (Signed)
MAILED LAB SLIP  AND LETTER - LIPID AND HEPATIC

## 2018-01-14 NOTE — Telephone Encounter (Signed)
-----   Message from Raiford Simmonds, RN sent at 11/19/2017 10:26 AM EDT ----- NEED TO MAIL @6 /28/19 HEPATIC , LIPID DUE 02/18/18 SEE LABS FROM 11/18/17

## 2018-01-21 DIAGNOSIS — L720 Epidermal cyst: Secondary | ICD-10-CM | POA: Diagnosis not present

## 2018-01-21 DIAGNOSIS — B079 Viral wart, unspecified: Secondary | ICD-10-CM | POA: Diagnosis not present

## 2018-01-21 DIAGNOSIS — D485 Neoplasm of uncertain behavior of skin: Secondary | ICD-10-CM | POA: Diagnosis not present

## 2018-02-04 DIAGNOSIS — N184 Chronic kidney disease, stage 4 (severe): Secondary | ICD-10-CM | POA: Diagnosis not present

## 2018-02-04 DIAGNOSIS — E559 Vitamin D deficiency, unspecified: Secondary | ICD-10-CM | POA: Diagnosis not present

## 2018-02-04 DIAGNOSIS — I129 Hypertensive chronic kidney disease with stage 1 through stage 4 chronic kidney disease, or unspecified chronic kidney disease: Secondary | ICD-10-CM | POA: Diagnosis not present

## 2018-02-19 ENCOUNTER — Other Ambulatory Visit: Payer: Self-pay

## 2018-02-19 ENCOUNTER — Encounter: Payer: Self-pay | Admitting: Vascular Surgery

## 2018-02-19 ENCOUNTER — Ambulatory Visit (INDEPENDENT_AMBULATORY_CARE_PROVIDER_SITE_OTHER): Payer: Medicare Other | Admitting: Vascular Surgery

## 2018-02-19 ENCOUNTER — Other Ambulatory Visit: Payer: Self-pay | Admitting: *Deleted

## 2018-02-19 ENCOUNTER — Ambulatory Visit (HOSPITAL_COMMUNITY)
Admission: RE | Admit: 2018-02-19 | Discharge: 2018-02-19 | Disposition: A | Discharge status: Home / Self Care | Payer: Medicare Other | Source: Ambulatory Visit | Attending: Family | Admitting: Family

## 2018-02-19 ENCOUNTER — Encounter: Payer: Self-pay | Admitting: *Deleted

## 2018-02-19 VITALS — BP 130/68 | HR 58 | Temp 97.3°F | Resp 18 | Ht 73.0 in | Wt 237.0 lb

## 2018-02-19 DIAGNOSIS — I200 Unstable angina: Secondary | ICD-10-CM | POA: Diagnosis not present

## 2018-02-19 DIAGNOSIS — I6523 Occlusion and stenosis of bilateral carotid arteries: Secondary | ICD-10-CM

## 2018-02-19 NOTE — H&P (View-Only) (Signed)
Patient name: Paul Mcneil MRN: [id number] DOB: 1946-07-07 Sex: male  REASON FOR VISIT:   Follow-up of carotid disease.  HPI:   Paul Mcneil is a pleasant 72 y.o. male who underwent a left carotid endarterectomy in July 2018.  This was for a greater than 80% asymptomatic left carotid stenosis.  He had a 60 to 79% right carotid stenosis.  Comes in for routine follow-up visit.  Of note, this patient was scheduled for coronary revascularization and his preoperative duplex found significant carotid disease.  Therefore he had a combined left carotid endarterectomy with CABG on 02/15/2017.  Since I saw him last the patient is doing well although he does admit to a episode of transient visual disturbance in his right eye last week.  He describes the visual fields becoming "darker."  This could potentially represent amaurosis fugax.  He denies any focal weakness or paresthesias except for some symptoms in his left arm which he says are positional and he thinks are related to carpal tunnel syndrome.  He denies any expressive or receptive aphasia.  He quit tobacco at the time of his heart surgery.  He is on aspirin and is on a statin.  Past Medical History:  Diagnosis Date  . Anemia 1964  . Arthritis    "touch starting to show up in the joints" (12/11/2013)  . CAD (coronary artery disease), native coronary artery    a. Cath 01/30/17 showing multivessel CAD --> underwent CABG on 02/15/2017 with LIMA-LAD, SVG-PDA, and SVG-Ramus-OM1.   . Carotid artery calcification    a. 01/2017: s/p L CEA performed at time of CABG; Existing Mod-Severe R Carotid stenosis  . COPD (chronic obstructive pulmonary disease) ([place])   . Diabetes ([place])   . Dyspnea    with exertion  . Former heavy tobacco smoker   . GERD (gastroesophageal reflux disease)    Dr. Michail Sermon  . History of alcohol abuse   . Hyperlipemia     Family History  Problem Relation Age of Onset  . Rheum arthritis Mother   . Cancer Father          BONE MARROW    SOCIAL HISTORY: Social History   Tobacco Use  . Smoking status: Former Smoker    Packs/day: 2.00    Years: 52.00    Pack years: 104.00    Types: Cigarettes    Last attempt to quit: 09/21/2016    Years since quitting: 1.4  . Smokeless tobacco: Never Used  Substance Use Topics  . Alcohol use: No    Alcohol/week: 12.0 oz    Types: 20 Glasses of wine per week    Allergies  Allergen Reactions  . Ceclor [Cefaclor] Anaphylaxis, Hives and Other (See Comments)    BASED ON CRITERIA FOR ANAPHYLAXIS HIVES + HYPOTENSION    Current Outpatient Medications  Medication Sig Dispense Refill  . aspirin 81 MG tablet Take 1 tablet (81 mg total) by mouth daily. 30 tablet 6  . bisoprolol (ZEBETA) 5 MG tablet Take 1 tablet (5 mg total) by mouth daily. 90 tablet 1  . esomeprazole (NEXIUM) 40 MG capsule Take 1 capsule (40 mg total) by mouth daily as needed (for acid reflux). 90 capsule 3  . Lactobacillus Casei-Folic Acid (RESTORA RX) 60-1.25 MG CAPS Take 1 tablet by mouth as needed.    Marland Kitchen LEVEMIR FLEXTOUCH 100 UNIT/ML Pen     . Multiple Vitamin (MULTIVITAMIN) tablet Take 1 tablet by mouth daily.    . Vitamin D, Ergocalciferol, (DRISDOL)  50000 units CAPS capsule TAKE ONE CAPSULE BY MOUTH WEEKLY FOR 8 WEEKS THEN DECREASE TO ONE CAPSULE EVERY MONTH  5  . rosuvastatin (CRESTOR) 40 MG tablet Take 1 tablet (40 mg total) by mouth daily. 90 tablet 3   No current facility-administered medications for this visit.     REVIEW OF SYSTEMS:  [X]  denotes positive finding, [ ]  denotes negative finding Cardiac  Comments:  Chest pain or chest pressure:    Shortness of breath upon exertion:    Short of breath when lying flat:    Irregular heart rhythm:        Vascular    Pain in calf, thigh, or hip brought on by ambulation:    Pain in feet at night that wakes you up from your sleep:     Blood clot in your veins:    Leg swelling:  x       Pulmonary    Oxygen at home:    Productive cough:      Wheezing:         Neurologic    Sudden weakness in arms or legs:     Sudden numbness in arms or legs:     Sudden onset of difficulty speaking or slurred speech:    Temporary loss of vision in one eye:     Problems with dizziness:         Gastrointestinal    Blood in stool:     Vomited blood:         Genitourinary    Burning when urinating:     Blood in urine:        Psychiatric    Major depression:         Hematologic    Bleeding problems:    Problems with blood clotting too easily:        Skin    Rashes or ulcers:        Constitutional    Fever or chills:     PHYSICAL EXAM:   Vitals:   02/19/18 1415 02/19/18 1422  BP: 128/68 130/68  Pulse: (!) 58 (!) 58  Resp: 18   Temp: (!) 97.3 F (36.3 C)   TempSrc: Oral   SpO2: 95%   Weight: 237 lb (107.5 kg)   Height: 6\' 1"  (1.854 m)     GENERAL: The patient is a well-nourished male, in no acute distress. The vital signs are documented above. CARDIAC: There is a regular rate and rhythm.  VASCULAR: He has a right carotid bruit. Both feet are warm and well-perfused. PULMONARY: There is good air exchange bilaterally without wheezing or rales. ABDOMEN: Soft and non-tender with normal pitched bowel sounds.  MUSCULOSKELETAL: There are no major deformities or cyanosis. NEUROLOGIC: No focal weakness or paresthesias are detected. SKIN: There are no ulcers or rashes noted. PSYCHIATRIC: The patient has a normal affect.  DATA:    CAROTID DUPLEX: I have independently interpreted his carotid duplex scan today.  He has a left carotid endarterectomy site is widely patent.  However the stenosis on the right has progressed to greater than 80%.  Peak systolic velocity is 174 cm/s with an end-diastolic velocity of 081 cm/s.  Both vertebral arteries are patent with antegrade flow.  MEDICAL ISSUES:   GREATER THAN 80% RIGHT CAROTID STENOSIS: This patient stenosis on the right has progressed to greater than 80%.  He is also had an  episode of transient visual disturbance in the right eye potentially related to the right carotid  stenosis.  I have recommended right carotid endarterectomy in order to lower his risk of future stroke.  He is on aspirin and is on a statin.  I have reviewed the indications for carotid endarterectomy, that is to lower the risk of future stroke. I have also reviewed the potential complications of surgery, including but not limited to: bleeding, stroke (perioperative risk 1-2%), MI, nerve injury of other unpredictable medical problems. All of the patients questions were answered and they are agreeable to proceed with surgery.   His surgery is scheduled for 03/04/2018.  He has had no recent cardiac symptoms.  [name] [facility] [phone]

## 2018-02-19 NOTE — Progress Notes (Signed)
Patient name: Paul Mcneil MRN: [id number] DOB: Aug 29, 1945 Sex: male  REASON FOR VISIT:   Follow-up of carotid disease.  HPI:   Paul Mcneil is a pleasant 72 y.o. male who underwent a left carotid endarterectomy in July 2018.  This was for a greater than 80% asymptomatic left carotid stenosis.  He had a 60 to 79% right carotid stenosis.  Comes in for routine follow-up visit.  Of note, this patient was scheduled for coronary revascularization and his preoperative duplex found significant carotid disease.  Therefore he had a combined left carotid endarterectomy with CABG on 02/15/2017.  Since I saw him last the patient is doing well although he does admit to a episode of transient visual disturbance in his right eye last week.  He describes the visual fields becoming "darker."  This could potentially represent amaurosis fugax.  He denies any focal weakness or paresthesias except for some symptoms in his left arm which he says are positional and he thinks are related to carpal tunnel syndrome.  He denies any expressive or receptive aphasia.  He quit tobacco at the time of his heart surgery.  He is on aspirin and is on a statin.  Past Medical History:  Diagnosis Date  . Anemia 1964  . Arthritis    "touch starting to show up in the joints" (12/11/2013)  . CAD (coronary artery disease), native coronary artery    a. Cath 01/30/17 showing multivessel CAD --> underwent CABG on 02/15/2017 with LIMA-LAD, SVG-PDA, and SVG-Ramus-OM1.   . Carotid artery calcification    a. 01/2017: s/p L CEA performed at time of CABG; Existing Mod-Severe R Carotid stenosis  . COPD (chronic obstructive pulmonary disease) ([place])   . Diabetes ([place])   . Dyspnea    with exertion  . Former heavy tobacco smoker   . GERD (gastroesophageal reflux disease)    Dr. Michail Sermon  . History of alcohol abuse   . Hyperlipemia     Family History  Problem Relation Age of Onset  . Rheum arthritis Mother   . Cancer Father          BONE MARROW    SOCIAL HISTORY: Social History   Tobacco Use  . Smoking status: Former Smoker    Packs/day: 2.00    Years: 52.00    Pack years: 104.00    Types: Cigarettes    Last attempt to quit: 09/21/2016    Years since quitting: 1.4  . Smokeless tobacco: Never Used  Substance Use Topics  . Alcohol use: No    Alcohol/week: 12.0 oz    Types: 20 Glasses of wine per week    Allergies  Allergen Reactions  . Ceclor [Cefaclor] Anaphylaxis, Hives and Other (See Comments)    BASED ON CRITERIA FOR ANAPHYLAXIS HIVES + HYPOTENSION    Current Outpatient Medications  Medication Sig Dispense Refill  . aspirin 81 MG tablet Take 1 tablet (81 mg total) by mouth daily. 30 tablet 6  . bisoprolol (ZEBETA) 5 MG tablet Take 1 tablet (5 mg total) by mouth daily. 90 tablet 1  . esomeprazole (NEXIUM) 40 MG capsule Take 1 capsule (40 mg total) by mouth daily as needed (for acid reflux). 90 capsule 3  . Lactobacillus Casei-Folic Acid (RESTORA RX) 60-1.25 MG CAPS Take 1 tablet by mouth as needed.    Marland Kitchen LEVEMIR FLEXTOUCH 100 UNIT/ML Pen     . Multiple Vitamin (MULTIVITAMIN) tablet Take 1 tablet by mouth daily.    . Vitamin D, Ergocalciferol, (DRISDOL)  50000 units CAPS capsule TAKE ONE CAPSULE BY MOUTH WEEKLY FOR 8 WEEKS THEN DECREASE TO ONE CAPSULE EVERY MONTH  5  . rosuvastatin (CRESTOR) 40 MG tablet Take 1 tablet (40 mg total) by mouth daily. 90 tablet 3   No current facility-administered medications for this visit.     REVIEW OF SYSTEMS:  [X]  denotes positive finding, [ ]  denotes negative finding Cardiac  Comments:  Chest pain or chest pressure:    Shortness of breath upon exertion:    Short of breath when lying flat:    Irregular heart rhythm:        Vascular    Pain in calf, thigh, or hip brought on by ambulation:    Pain in feet at night that wakes you up from your sleep:     Blood clot in your veins:    Leg swelling:  x       Pulmonary    Oxygen at home:    Productive cough:      Wheezing:         Neurologic    Sudden weakness in arms or legs:     Sudden numbness in arms or legs:     Sudden onset of difficulty speaking or slurred speech:    Temporary loss of vision in one eye:     Problems with dizziness:         Gastrointestinal    Blood in stool:     Vomited blood:         Genitourinary    Burning when urinating:     Blood in urine:        Psychiatric    Major depression:         Hematologic    Bleeding problems:    Problems with blood clotting too easily:        Skin    Rashes or ulcers:        Constitutional    Fever or chills:     PHYSICAL EXAM:   Vitals:   02/19/18 1415 02/19/18 1422  BP: 128/68 130/68  Pulse: (!) 58 (!) 58  Resp: 18   Temp: (!) 97.3 F (36.3 C)   TempSrc: Oral   SpO2: 95%   Weight: 237 lb (107.5 kg)   Height: 6\' 1"  (1.854 m)     GENERAL: The patient is a well-nourished male, in no acute distress. The vital signs are documented above. CARDIAC: There is a regular rate and rhythm.  VASCULAR: He has a right carotid bruit. Both feet are warm and well-perfused. PULMONARY: There is good air exchange bilaterally without wheezing or rales. ABDOMEN: Soft and non-tender with normal pitched bowel sounds.  MUSCULOSKELETAL: There are no major deformities or cyanosis. NEUROLOGIC: No focal weakness or paresthesias are detected. SKIN: There are no ulcers or rashes noted. PSYCHIATRIC: The patient has a normal affect.  DATA:    CAROTID DUPLEX: I have independently interpreted his carotid duplex scan today.  He has a left carotid endarterectomy site is widely patent.  However the stenosis on the right has progressed to greater than 80%.  Peak systolic velocity is 818 cm/s with an end-diastolic velocity of 563 cm/s.  Both vertebral arteries are patent with antegrade flow.  MEDICAL ISSUES:   GREATER THAN 80% RIGHT CAROTID STENOSIS: This patient stenosis on the right has progressed to greater than 80%.  He is also had an  episode of transient visual disturbance in the right eye potentially related to the right carotid  stenosis.  I have recommended right carotid endarterectomy in order to lower his risk of future stroke.  He is on aspirin and is on a statin.  I have reviewed the indications for carotid endarterectomy, that is to lower the risk of future stroke. I have also reviewed the potential complications of surgery, including but not limited to: bleeding, stroke (perioperative risk 1-2%), MI, nerve injury of other unpredictable medical problems. All of the patients questions were answered and they are agreeable to proceed with surgery.   His surgery is scheduled for 03/04/2018.  He has had no recent cardiac symptoms.  [name] [facility] [phone]

## 2018-02-19 NOTE — Progress Notes (Signed)
PATIENT INSTRUCTED TO STAY ON ASPIRIN

## 2018-02-20 ENCOUNTER — Telehealth: Payer: Self-pay | Admitting: *Deleted

## 2018-02-20 NOTE — Telephone Encounter (Signed)
-----   Message from Angelia Mould, MD sent at 02/20/2018  9:26 AM EDT ----- Regarding: RE: preop  Thanks Shanon Brow ----- Message ----- From: Leonie Man, MD Sent: 02/19/2018  10:55 PM To: Angelia Mould, MD Subject: RE: preop                                      That should be fine. I saw him in February -- he was doing well.  I probably would not do any additional testing. Make sure that the anesthesiologist avoids XS volume load.  Glenetta Hew, MD    ----- Message ----- From: Angelia Mould, MD Sent: 02/19/2018   3:06 PM To: Leonie Man, MD Subject: preop                                          [name], This patient underwent a left carotid endarterectomy and CABG a year ago.  He had a moderate right carotid stenosis which is now progressed to greater than 80%.  I have recommended right carotid endarterectomy.  He has not had any recent cardiac symptoms.  I have him scheduled for surgery on 03/04/2018 unless you feel that he needs any type of preoperative cardiac evaluation.  Thanks Gerald Stabs

## 2018-02-20 NOTE — Telephone Encounter (Signed)
Staff message documentation of cardia clearance.

## 2018-02-24 ENCOUNTER — Encounter (HOSPITAL_COMMUNITY): Payer: Self-pay

## 2018-02-24 ENCOUNTER — Other Ambulatory Visit: Payer: Self-pay

## 2018-02-24 ENCOUNTER — Encounter (HOSPITAL_COMMUNITY)
Admission: RE | Admit: 2018-02-24 | Discharge: 2018-02-24 | Disposition: A | Discharge status: Home / Self Care | Payer: Medicare Other | Source: Ambulatory Visit | Attending: Vascular Surgery | Admitting: Vascular Surgery

## 2018-02-24 DIAGNOSIS — I6523 Occlusion and stenosis of bilateral carotid arteries: Secondary | ICD-10-CM | POA: Insufficient documentation

## 2018-02-24 DIAGNOSIS — Z01818 Encounter for other preprocedural examination: Secondary | ICD-10-CM | POA: Diagnosis not present

## 2018-02-24 HISTORY — DX: Chronic kidney disease, unspecified: N18.9

## 2018-02-24 LAB — URINALYSIS, ROUTINE W REFLEX MICROSCOPIC
BACTERIA UA: NONE SEEN
BILIRUBIN URINE: NEGATIVE
Glucose, UA: NEGATIVE mg/dL
HGB URINE DIPSTICK: NEGATIVE
KETONES UR: NEGATIVE mg/dL
Leukocytes, UA: NEGATIVE
Nitrite: NEGATIVE
Protein, ur: >=300 mg/dL — AB
Specific Gravity, Urine: 1.019 (ref 1.005–1.030)
pH: 5 (ref 5.0–8.0)

## 2018-02-24 LAB — CBC
HEMATOCRIT: 39.6 % (ref 39.0–52.0)
HEMOGLOBIN: 12.9 g/dL — AB (ref 13.0–17.0)
MCH: 28.2 pg (ref 26.0–34.0)
MCHC: 32.6 g/dL (ref 30.0–36.0)
MCV: 86.5 fL (ref 78.0–100.0)
Platelets: 206 10*3/uL (ref 150–400)
RBC: 4.58 MIL/uL (ref 4.22–5.81)
RDW: 13.2 % (ref 11.5–15.5)
WBC: 5.2 10*3/uL (ref 4.0–10.5)

## 2018-02-24 LAB — COMPREHENSIVE METABOLIC PANEL
ALBUMIN: 3.7 g/dL (ref 3.5–5.0)
ALK PHOS: 58 U/L (ref 38–126)
ALT: 24 U/L (ref 0–44)
ANION GAP: 9 (ref 5–15)
AST: 23 U/L (ref 15–41)
BILIRUBIN TOTAL: 0.7 mg/dL (ref 0.3–1.2)
BUN: 38 mg/dL — ABNORMAL HIGH (ref 8–23)
CALCIUM: 9 mg/dL (ref 8.9–10.3)
CO2: 24 mmol/L (ref 22–32)
Chloride: 107 mmol/L (ref 98–111)
Creatinine, Ser: 2.4 mg/dL — ABNORMAL HIGH (ref 0.61–1.24)
GFR calc Af Amer: 30 mL/min — ABNORMAL LOW (ref >60)
GFR calc non Af Amer: 26 mL/min — ABNORMAL LOW (ref >60)
GLUCOSE: 109 mg/dL — AB (ref 70–99)
Potassium: 4.2 mmol/L (ref 3.5–5.1)
SODIUM: 140 mmol/L (ref 135–145)
Total Protein: 6.5 g/dL (ref 6.5–8.1)

## 2018-02-24 LAB — TYPE AND SCREEN
ABO/RH(D): A POS
Antibody Screen: NEGATIVE

## 2018-02-24 LAB — APTT: APTT: 29 s (ref 24–36)

## 2018-02-24 LAB — PROTIME-INR
INR: 1.02
PROTHROMBIN TIME: 13.3 s (ref 11.4–15.2)

## 2018-02-24 LAB — SURGICAL PCR SCREEN
MRSA, PCR: NEGATIVE
STAPHYLOCOCCUS AUREUS: NEGATIVE

## 2018-02-24 LAB — GLUCOSE, CAPILLARY: GLUCOSE-CAPILLARY: 127 mg/dL — AB (ref 70–99)

## 2018-02-24 LAB — HEMOGLOBIN A1C
Hgb A1c MFr Bld: 6 % — ABNORMAL HIGH (ref 4.8–5.6)
MEAN PLASMA GLUCOSE: 125.5 mg/dL

## 2018-02-24 NOTE — Progress Notes (Signed)
PCP - [place] Cardiologist -[place] Nephrologist- Dr. Carolin Sicks per pt   Chest x-ray - 03/11/17 EKG - 03/12/17 ECHO - 02/15/17 Cardiac Cath - 01/30/17  Fasting Blood Sugar - typically 113-115 per patient, A1c drawn today  Anesthesia review: heart history  Patient denies shortness of breath, fever, cough and chest pain at PAT appointment   Patient verbalized understanding of instructions that were given to them at the PAT appointment. Patient was also instructed that they will need to review over the PAT instructions again at home before surgery.

## 2018-02-24 NOTE — Pre-Procedure Instructions (Signed)
Paul Mcneil  02/24/2018      [facility] [address] [place], [place] [address] Phone: [phone] Fax: [place] [address] Phone: [phone] Fax: [phone]    Your procedure is scheduled on Tuesday August 13.  Report to [facility] Admitting at 5:30 A.M.  Call this number if you have problems the morning of surgery:  [phone]   Remember:  Do not eat or drink after midnight.    Take these medicines the morning of surgery with A SIP OF WATER:   Bisoprolol (Zebeta) Esomeprazole (Nexium) Acetaminophen (tylenol) if needed  FOLLOW your surgeon's instructions regarding Aspirin.  TAKE HALF dose of Levemir the night before surgery (7 units) TAKE HALF dose of Levemir the day of surgery (7 units)  7 days prior to surgery STOP taking any  Aleve, Naproxen, Ibuprofen, Motrin, Advil, Goody's, BC's, all herbal medications, fish oil, and all vitamins     How to Manage Your Diabetes Before and After Surgery  Why is it important to control my blood sugar before and after surgery? . Improving blood sugar levels before and after surgery helps healing and can limit problems. . A way of improving blood sugar control is eating a healthy diet by: o  Eating less sugar and carbohydrates o  Increasing activity/exercise o  Talking with your doctor about reaching your blood sugar goals . High blood sugars (greater than 180 mg/dL) can raise your risk of infections and slow your recovery, so you will need to focus on controlling your diabetes during the weeks before surgery. . Make sure that the doctor who takes care of your diabetes knows about your planned surgery including the date and location.  How do I manage my blood sugar before surgery? . Check your blood sugar at least 4 times a day, starting 2 days before  surgery, to make sure that the level is not too high or low. o Check your blood sugar the morning of your surgery when you wake up and every 2 hours until you get to the Short Stay unit. . If your blood sugar is less than 70 mg/dL, you will need to treat for low blood sugar: o Do not take insulin. o Treat a low blood sugar (less than 70 mg/dL) with  cup of clear juice (cranberry or apple), 4 glucose tablets, OR glucose gel. Recheck blood sugar in 15 minutes after treatment (to make sure it is greater than 70 mg/dL). If your blood sugar is not greater than 70 mg/dL on recheck, call [phone] o  for further instructions. . Report your blood sugar to the short stay nurse when you get to Short Stay.  . If you are admitted to the hospital after surgery: o Your blood sugar will be checked by the staff and you will probably be given insulin after surgery (instead of oral diabetes medicines) to make sure you have good blood sugar levels. o The goal for blood sugar control after surgery is 80-180 mg/dL.                Do not wear jewelry  Do not wear lotions, powders, or colognes, or deodorant.  Do not shave 48 hours prior to surgery.  Men may shave face and neck.  Do not bring valuables to the hospital.  [facility] is not responsible for  any belongings or valuables.  Contacts, dentures or bridgework may not be worn into surgery.  Leave your suitcase in the car.  After surgery it may be brought to your room.  For patients admitted to the hospital, discharge time will be determined by your treatment team.  Patients discharged the day of surgery will not be allowed to drive home.  Special instructions:    - Preparing For Surgery  Before surgery, you can play an important role. Because skin is not sterile, your skin needs to be as free of germs as possible. You can reduce the number of germs on your skin by washing with CHG (chlorahexidine gluconate) Soap before surgery.   CHG is an antiseptic cleaner which kills germs and bonds with the skin to continue killing germs even after washing.    Oral Hygiene is also important to reduce your risk of infection.  Remember - BRUSH YOUR TEETH THE MORNING OF SURGERY WITH YOUR REGULAR TOOTHPASTE  Please do not use if you have an allergy to CHG or antibacterial soaps. If your skin becomes reddened/irritated stop using the CHG.  Do not shave (including legs and underarms) for at least 48 hours prior to first CHG shower. It is OK to shave your face.  Please follow these instructions carefully.   1. Shower the NIGHT BEFORE SURGERY and the MORNING OF SURGERY with CHG.   2. If you chose to wash your hair, wash your hair first as usual with your normal shampoo.  3. After you shampoo, rinse your hair and body thoroughly to remove the shampoo.  4. Use CHG as you would any other liquid soap. You can apply CHG directly to the skin and wash gently with a scrungie or a clean washcloth.   5. Apply the CHG Soap to your body ONLY FROM THE NECK DOWN.  Do not use on open wounds or open sores. Avoid contact with your eyes, ears, mouth and genitals (private parts). Wash Face and genitals (private parts)  with your normal soap.  6. Wash thoroughly, paying special attention to the area where your surgery will be performed.  7. Thoroughly rinse your body with warm water from the neck down.  8. DO NOT shower/wash with your normal soap after using and rinsing off the CHG Soap.  9. Pat yourself dry with a CLEAN TOWEL.  10. Wear CLEAN PAJAMAS to bed the night before surgery, wear comfortable clothes the morning of surgery  11. Place CLEAN SHEETS on your bed the night of your first shower and DO NOT SLEEP WITH PETS.    Day of Surgery:  Do not apply any deodorants/lotions.  Please wear clean clothes to the hospital/surgery center.   Remember to brush your teeth WITH YOUR REGULAR TOOTHPASTE.    Please read over the following fact  sheets that you were given. Coughing and Deep Breathing, MRSA Information and Surgical Site Infection Prevention

## 2018-02-25 NOTE — Progress Notes (Signed)
Anesthesia Chart Review:  Case:  [id number] Date/Time:  03/04/18 0715   Procedure:  ENDARTERECTOMY CAROTID RIGHT (Right )   Anesthesia type:  General   Pre-op diagnosis:  RIGHT CAROTID STENOSIS   Location:  [facility] ROOM 12 / [facility]   Surgeon:  Angelia Mould, MD      DISCUSSION: 72 yo male former smoker for above procedure. Pertinent hx includes CKD, DOE, DMII, COPD, GERD, Hx ETOH abuse, CAD (s/p CABG on 02/15/2017 with LIMA-LAD, SVG-PDA, and SVG-Ramus-OM1.), Carotid artery stenosis (s/p L CEA performed at time of CABG; Existing Mod-Severe R Carotid stenosis).  Pt is s/p CABG + L CEA -  02/15/2017: LIMA to LAD, SVG to PDA, SVG to Ramus and OM1 w/ Left Carotid CEA. His most recent cardiology f/u was with Dr. [name] on 09/06/2017. At that time Dr. [name] noted "Paul Mcneil seems to be doing very well after his CABG and carotid endarterectomy." It was discussed that he would be following up with vascular surgery for management of right carotid stenosis.  Anticipate he can proceed with surgery as scheduled barring acute status change.  VS: BP 139/65   Pulse (!) 58   Temp 37.2 C   Resp 18   Ht 6\' 1"  (1.854 m)   Wt 239 lb 3.2 oz (108.5 kg)   SpO2 95%   BMI 31.56 kg/m   PROVIDERS: [place], [place], MD is PCP  Glenetta Hew, MD is Cardiologist last seen 09/06/2017.  Lawson Radar, MD is Nephrologist  LABS: Labs consistent with pt's CKD. Creatinine appears stably elevated compared to previous.  Creatinine, Ser (mg/dL)  Date Value  02/24/2018 2.40 (H)  02/19/2017 2.32 (H)  02/18/2017 2.34 (H)  02/17/2017 2.39 (H)  02/16/2017 2.20 (H)   (all labs ordered are listed, but only abnormal results are displayed)  Labs Reviewed  GLUCOSE, CAPILLARY - Abnormal; Notable for the following components:      Result Value   Glucose-Capillary 127 (*)    All other components within normal limits  HEMOGLOBIN A1C - Abnormal; Notable for the following components:   Hgb A1c MFr Bld 6.0 (*)    All  other components within normal limits  CBC - Abnormal; Notable for the following components:   Hemoglobin 12.9 (*)    All other components within normal limits  COMPREHENSIVE METABOLIC PANEL - Abnormal; Notable for the following components:   Glucose, Bld 109 (*)    BUN 38 (*)    Creatinine, Ser 2.40 (*)    GFR calc non Af Amer 26 (*)    GFR calc Af Amer 30 (*)    All other components within normal limits  URINALYSIS, ROUTINE W REFLEX MICROSCOPIC - Abnormal; Notable for the following components:   Protein, ur >=300 (*)    All other components within normal limits  SURGICAL PCR SCREEN  APTT  PROTIME-INR  TYPE AND SCREEN     IMAGES: CHEST  2 VIEW 03/12/2017  COMPARISON:  02/18/2017.  FINDINGS: Prior CABG. Heart size stable. No pulmonary venous congestion. No focal infiltrate. Interim resolution of pulmonary edema . Mild basilar subsegmental atelectasis.  IMPRESSION: Prior CABG. Interim resolution of pulmonary edema. Mild bibasilar subsegmental atelectasis .   EKG: 03/11/2017: Normal sinus rhythm.  Inferior infarct, age undetermined.  T wave abnormality, consider lateral ischemia.  CV: Carotid US 02/19/2018: Final Interpretation: Right Carotid: Velocities in the right ICA are consistent with a 80-99%        stenosis. The ECA appears >50% stenosed.  Left  Carotid: Velocities in the left ICA are consistent with a 1-39% stenosis.       The extracranial vessels were near-normal with only minimal wall       thickening or plaque.  Vertebrals: Bilateral vertebral arteries demonstrate antegrade flow. Subclavians: Normal flow hemodynamics were seen in bilateral subclavian       arteries.  TEE 02/15/2017: Study Conclusions  - Left ventricle: The cavity size was normal. Wall thickness was   normal. Systolic function was vigorous. The estimated ejection   fraction was in the range of 65% to 70%. Wall motion was normal;   there were no  regional wall motion abnormalities. - Mitral valve: Normal-sized, mildly calcified annulus. Normal   thickness leaflets . Leaflet separation was normal. Mobility was   not restricted. No echocardiographic evidence for prolapse. There   was a filamentous attachment on the posterior leaflet, so the   possibility of chordal rupture to the posterior leaflet cannot be   excluded. There was only mild regurgitation originating from the   central commissure and directed centrally. - Staged echo: Limited post CPB exam: Good, vigorous LV function   post Bypass. EF 65-70%, as was in the pre-bypass exam. No change   post bypass in aortic valve function. On initial spearation from   CPB, there was moderate central MR. This improved to mild,   similar to the pre-operative exam, with time off of CPB.  Impressions:  - LV function remains vigorous, with no wall motion abnormality.   There are no significant changes from pre-bypass images.  Past Medical History:  Diagnosis Date  . Anemia 1964  . Arthritis    "touch starting to show up in the joints" (12/11/2013)  . CAD (coronary artery disease), native coronary artery    a. Cath 01/30/17 showing multivessel CAD --> underwent CABG on 02/15/2017 with LIMA-LAD, SVG-PDA, and SVG-Ramus-OM1.   . Carotid artery calcification    a. 01/2017: s/p L CEA performed at time of CABG; Existing Mod-Severe R Carotid stenosis  . Chronic kidney disease    "my kidneys are down to 30% function"  . COPD (chronic obstructive pulmonary disease) ([place])    "they said I had it"  . Diabetes ([place])   . Dyspnea    with exertion  . Former heavy tobacco smoker   . GERD (gastroesophageal reflux disease)    Dr. Michail Sermon  . History of alcohol abuse   . Hyperlipemia     Past Surgical History:  Procedure Laterality Date  . APPENDECTOMY  12/11/2013   "laparoscopic"  . BRONCHOSCOPY    . CHOLECYSTECTOMY  ~ 2010  . CORONARY ARTERY BYPASS GRAFT N/A 02/15/2017   Procedure: CORONARY  ARTERY BYPASS GRAFTING (CABG) x4 :LAD to  LIMA, SVG to PDA , Sequential SVG to OM1 and Ramus;  Surgeon: Melrose Nakayama, MD;  Location: [facility];  Service: Open Heart Surgery;  Laterality: N/A;  . ENDARTERECTOMY Left 02/15/2017   Procedure: LEFT ENDARTERECTOMY CAROTID;  Surgeon: Angelia Mould, MD;  Location: [facility];  Service: Vascular;  Laterality: Left;  . LAPAROSCOPIC APPENDECTOMY N/A 12/11/2013   Procedure: APPENDECTOMY LAPAROSCOPIC;  Surgeon: Imogene Burn. Georgette Dover, MD;  Location: [facility];  Service: General;  Laterality: N/A;  . RIGHT/LEFT HEART CATH AND CORONARY ANGIOGRAPHY N/A 01/30/2017   Procedure: Right/Left Heart Cath and Coronary Angiography;  Surgeon: Troy Sine, MD;  Location: [facility];  Service: Cardiovascular: Normal RHC pressures.  Significant multivessel CAD with 50-60% smooth d-LM, 40% mid LAD. 50%  mid LAD stenoses; RI --70% ostial & 80% prox RI; 90% pCx w/  80% and 70% distal; 80% oxt-prox & 90% prox-mid -> 100% CTO distal mid RCA w/ L-R collaterals  . TEE WITHOUT CARDIOVERSION N/A 02/15/2017   Procedure: TRANSESOPHAGEAL ECHOCARDIOGRAM (TEE);  Surgeon: Melrose Nakayama, MD;  Location: [facility] OR;  Service: Open Heart Surgery: Vigorous wall motion.  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  Mild MR noted.  . TONSILLECTOMY AND ADENOIDECTOMY  1950's  . 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  Otherwise normal cavity size.  Mild LVH.  EF 60-65%.  GR 1 DD.  Mild MR.    MEDICATIONS: . acetaminophen (TYLENOL) 500 MG tablet  . aspirin 81 MG tablet  . bisoprolol (ZEBETA) 5 MG tablet  . esomeprazole (NEXIUM) 40 MG capsule  . Lactobacillus Casei-Folic Acid (RESTORA RX) 60-1.25 MG CAPS  . LEVEMIR FLEXTOUCH 100 UNIT/ML Pen  . Multiple Vitamin (MULTIVITAMIN WITH MINERALS) TABS tablet  . rosuvastatin (CRESTOR) 40 MG tablet  . Vitamin D, Ergocalciferol, (DRISDOL)  50000 units CAPS capsule   No current facility-administered medications for this encounter.      Wynonia Musty [facility] Short Stay Center/Anesthesiology Phone [phone] 02/25/2018 11:06 AM

## 2018-02-27 NOTE — Progress Notes (Signed)
Anesthesia follow-up: See previous preoperative note by Karoline Caldwell, PA-C. Nephrology records received from [facility]. Patient was last seen on 02/04/18 by Dr. Lawson Radar for CKD stage IV. BUN 47, Cr 2.52, eGFR 25 (if non-AA) and 29 (if AA). Cr 2.47 on 08/28/17. Advised to avoid NSAIDS or nephrotoxins and to optimize glycemic and HTN control. Preoperative BUN 38, creatinine 2.40. Overall, renal function appears within his known baseline.  Paul Mcneil [name] Short Stay Center/Anesthesiology Phone [phone] 02/27/2018 5:56 PM

## 2018-03-03 MED ORDER — SODIUM CHLORIDE 0.9 % IV SOLN
1500.0000 mg | INTRAVENOUS | Status: AC
  Administered 2018-03-04: 1500 mg via INTRAVENOUS
  Filled 2018-03-03: qty 1500

## 2018-03-03 NOTE — Anesthesia Preprocedure Evaluation (Addendum)
Anesthesia Evaluation  Patient identified by MRN, date of birth, ID band Patient awake    Reviewed: Allergy & Precautions, NPO status   Airway Mallampati: II  TM Distance: >3 FB Neck ROM: Full    Dental  (+) Dental Advisory Given   Pulmonary COPD, former smoker,    breath sounds clear to auscultation       Cardiovascular hypertension, Pt. on medications and Pt. on home beta blockers + CAD, + CABG and + Peripheral Vascular Disease   Rhythm:Regular     Neuro/Psych    GI/Hepatic Neg liver ROS, GERD  ,  Endo/Other  diabetes, Insulin Dependent  Renal/GU CRFRenal disease     Musculoskeletal  (+) Arthritis ,   Abdominal   Peds  Hematology  (+) anemia ,   Anesthesia Other Findings   Reproductive/Obstetrics                            Lab Results  Component Value Date   WBC 5.2 02/24/2018   HGB 12.9 (L) 02/24/2018   HCT 39.6 02/24/2018   MCV 86.5 02/24/2018   PLT 206 02/24/2018   Lab Results  Component Value Date   CREATININE 2.40 (H) 02/24/2018   BUN 38 (H) 02/24/2018   NA 140 02/24/2018   K 4.2 02/24/2018   CL 107 02/24/2018   CO2 24 02/24/2018    Anesthesia Physical Anesthesia Plan  ASA: III  Anesthesia Plan: General   Post-op Pain Management:    Induction: Intravenous  PONV Risk Score and Plan: 2 and Ondansetron, Dexamethasone and Treatment may vary due to age or medical condition  Airway Management Planned: Oral ETT  Additional Equipment: Arterial line  Intra-op Plan:   Post-operative Plan: Extubation in OR and Possible Post-op intubation/ventilation  Informed Consent: I have reviewed the patients History and Physical, chart, labs and discussed the procedure including the risks, benefits and alternatives for the proposed anesthesia with the patient or authorized representative who has indicated his/her understanding and acceptance.   Dental advisory given  Plan  Discussed with: CRNA  Anesthesia Plan Comments:        Anesthesia Quick Evaluation

## 2018-03-04 ENCOUNTER — Encounter (HOSPITAL_COMMUNITY)
Admission: RE | Disposition: A | Discharge status: Home / Self Care | Payer: Self-pay | Source: Home / Self Care | Attending: Vascular Surgery

## 2018-03-04 ENCOUNTER — Inpatient Hospital Stay (HOSPITAL_COMMUNITY): Payer: Medicare Other | Admitting: Anesthesiology

## 2018-03-04 ENCOUNTER — Inpatient Hospital Stay (HOSPITAL_COMMUNITY)
Admission: RE | Admit: 2018-03-04 | Discharge: 2018-03-05 | DRG: 039 | Disposition: A | Discharge status: Home / Self Care | Payer: Medicare Other | Attending: Vascular Surgery | Admitting: Vascular Surgery

## 2018-03-04 ENCOUNTER — Other Ambulatory Visit: Payer: Self-pay

## 2018-03-04 ENCOUNTER — Encounter (HOSPITAL_COMMUNITY): Payer: Self-pay

## 2018-03-04 ENCOUNTER — Inpatient Hospital Stay (HOSPITAL_COMMUNITY): Payer: Medicare Other | Admitting: Physician Assistant

## 2018-03-04 DIAGNOSIS — Z881 Allergy status to other antibiotic agents status: Secondary | ICD-10-CM

## 2018-03-04 DIAGNOSIS — K219 Gastro-esophageal reflux disease without esophagitis: Secondary | ICD-10-CM | POA: Diagnosis present

## 2018-03-04 DIAGNOSIS — Z7982 Long term (current) use of aspirin: Secondary | ICD-10-CM

## 2018-03-04 DIAGNOSIS — I6521 Occlusion and stenosis of right carotid artery: Principal | ICD-10-CM | POA: Diagnosis present

## 2018-03-04 DIAGNOSIS — E1151 Type 2 diabetes mellitus with diabetic peripheral angiopathy without gangrene: Secondary | ICD-10-CM | POA: Diagnosis not present

## 2018-03-04 DIAGNOSIS — D631 Anemia in chronic kidney disease: Secondary | ICD-10-CM | POA: Diagnosis present

## 2018-03-04 DIAGNOSIS — I2510 Atherosclerotic heart disease of native coronary artery without angina pectoris: Secondary | ICD-10-CM | POA: Diagnosis present

## 2018-03-04 DIAGNOSIS — Z87891 Personal history of nicotine dependence: Secondary | ICD-10-CM | POA: Diagnosis not present

## 2018-03-04 DIAGNOSIS — N183 Chronic kidney disease, stage 3 (moderate): Secondary | ICD-10-CM | POA: Diagnosis present

## 2018-03-04 DIAGNOSIS — E785 Hyperlipidemia, unspecified: Secondary | ICD-10-CM | POA: Diagnosis present

## 2018-03-04 DIAGNOSIS — I6529 Occlusion and stenosis of unspecified carotid artery: Secondary | ICD-10-CM | POA: Diagnosis present

## 2018-03-04 DIAGNOSIS — Z79899 Other long term (current) drug therapy: Secondary | ICD-10-CM

## 2018-03-04 DIAGNOSIS — Z951 Presence of aortocoronary bypass graft: Secondary | ICD-10-CM | POA: Diagnosis not present

## 2018-03-04 DIAGNOSIS — E1122 Type 2 diabetes mellitus with diabetic chronic kidney disease: Secondary | ICD-10-CM | POA: Diagnosis present

## 2018-03-04 DIAGNOSIS — I129 Hypertensive chronic kidney disease with stage 1 through stage 4 chronic kidney disease, or unspecified chronic kidney disease: Secondary | ICD-10-CM | POA: Diagnosis present

## 2018-03-04 DIAGNOSIS — J449 Chronic obstructive pulmonary disease, unspecified: Secondary | ICD-10-CM | POA: Diagnosis not present

## 2018-03-04 DIAGNOSIS — Z794 Long term (current) use of insulin: Secondary | ICD-10-CM | POA: Diagnosis not present

## 2018-03-04 DIAGNOSIS — H539 Unspecified visual disturbance: Secondary | ICD-10-CM | POA: Diagnosis not present

## 2018-03-04 HISTORY — PX: ENDARTERECTOMY: SHX5162

## 2018-03-04 LAB — GLUCOSE, CAPILLARY
GLUCOSE-CAPILLARY: 112 mg/dL — AB (ref 70–99)
GLUCOSE-CAPILLARY: 175 mg/dL — AB (ref 70–99)
Glucose-Capillary: 110 mg/dL — ABNORMAL HIGH (ref 70–99)
Glucose-Capillary: 131 mg/dL — ABNORMAL HIGH (ref 70–99)
Glucose-Capillary: 154 mg/dL — ABNORMAL HIGH (ref 70–99)

## 2018-03-04 LAB — POCT ACTIVATED CLOTTING TIME: Activated Clotting Time: 252 seconds

## 2018-03-04 SURGERY — ENDARTERECTOMY, CAROTID
Anesthesia: General | Site: Neck | Laterality: Right

## 2018-03-04 MED ORDER — ACETAMINOPHEN 325 MG PO TABS: 325.0000 mg | ORAL_TABLET | ORAL | Status: DC | PRN

## 2018-03-04 MED ORDER — MIDAZOLAM HCL 2 MG/2ML IJ SOLN
INTRAMUSCULAR | Status: AC
  Filled 2018-03-04: qty 2

## 2018-03-04 MED ORDER — DOCUSATE SODIUM 100 MG PO CAPS
100.0000 mg | ORAL_CAPSULE | Freq: Every day | ORAL | Status: DC
  Administered 2018-03-05: 100 mg via ORAL
  Filled 2018-03-04 (×2): qty 1

## 2018-03-04 MED ORDER — OXYCODONE-ACETAMINOPHEN 5-325 MG PO TABS
1.0000 | ORAL_TABLET | ORAL | Status: DC | PRN
  Administered 2018-03-04: 2 via ORAL
  Filled 2018-03-04: qty 2

## 2018-03-04 MED ORDER — PROTAMINE SULFATE 10 MG/ML IV SOLN
INTRAVENOUS | Status: DC | PRN
  Administered 2018-03-04: 10 mg via INTRAVENOUS
  Administered 2018-03-04: 40 mg via INTRAVENOUS

## 2018-03-04 MED ORDER — GLYCOPYRROLATE PF 0.2 MG/ML IJ SOSY
PREFILLED_SYRINGE | INTRAMUSCULAR | Status: AC
  Filled 2018-03-04: qty 1

## 2018-03-04 MED ORDER — LIDOCAINE 2% (20 MG/ML) 5 ML SYRINGE
INTRAMUSCULAR | Status: AC
  Filled 2018-03-04: qty 5

## 2018-03-04 MED ORDER — LIDOCAINE HCL (CARDIAC) PF 100 MG/5ML IV SOSY
PREFILLED_SYRINGE | INTRAVENOUS | Status: DC | PRN
  Administered 2018-03-04 (×2): 50 mg via INTRATRACHEAL

## 2018-03-04 MED ORDER — DEXTRAN 40 IN D5W 10 % IV SOLN
INTRAVENOUS | Status: AC
  Filled 2018-03-04: qty 500

## 2018-03-04 MED ORDER — POLYETHYLENE GLYCOL 3350 17 G PO PACK: 17.0000 g | PACK | Freq: Every day | ORAL | Status: DC | PRN

## 2018-03-04 MED ORDER — PROTAMINE SULFATE 10 MG/ML IV SOLN
INTRAVENOUS | Status: AC
  Filled 2018-03-04: qty 10

## 2018-03-04 MED ORDER — SODIUM CHLORIDE 0.9 % IV SOLN
INTRAVENOUS | Status: DC
  Administered 2018-03-04: 09:00:00 via INTRAVENOUS

## 2018-03-04 MED ORDER — ONDANSETRON HCL 4 MG/2ML IJ SOLN
INTRAMUSCULAR | Status: AC
Start: 2018-03-04 — End: ?
  Filled 2018-03-04: qty 2

## 2018-03-04 MED ORDER — INSULIN DETEMIR 100 UNIT/ML FLEXPEN: 15.0000 [IU] | PEN_INJECTOR | Freq: Two times a day (BID) | SUBCUTANEOUS | Status: DC

## 2018-03-04 MED ORDER — ACETAMINOPHEN 325 MG RE SUPP: 325.0000 mg | RECTAL | Status: DC | PRN

## 2018-03-04 MED ORDER — GLYCOPYRROLATE 0.2 MG/ML IJ SOLN
INTRAMUSCULAR | Status: DC | PRN
  Administered 2018-03-04: .2 mg via INTRAVENOUS

## 2018-03-04 MED ORDER — LACTATED RINGERS IV SOLN
INTRAVENOUS | Status: DC | PRN
  Administered 2018-03-04: 07:00:00 via INTRAVENOUS

## 2018-03-04 MED ORDER — MAGNESIUM SULFATE 2 GM/50ML IV SOLN: 2.0000 g | Freq: Every day | INTRAVENOUS | Status: DC | PRN

## 2018-03-04 MED ORDER — MIDAZOLAM HCL 5 MG/5ML IJ SOLN
INTRAMUSCULAR | Status: DC | PRN
  Administered 2018-03-04: 1 mg via INTRAVENOUS

## 2018-03-04 MED ORDER — PANTOPRAZOLE SODIUM 40 MG PO TBEC
40.0000 mg | DELAYED_RELEASE_TABLET | Freq: Every day | ORAL | Status: DC
  Administered 2018-03-04: 40 mg via ORAL
  Filled 2018-03-04 (×2): qty 1

## 2018-03-04 MED ORDER — SODIUM CHLORIDE 0.9 % IV SOLN
INTRAVENOUS | Status: DC
  Administered 2018-03-04: 11:00:00 via INTRAVENOUS

## 2018-03-04 MED ORDER — FENTANYL CITRATE (PF) 100 MCG/2ML IJ SOLN: 25.0000 ug | INTRAMUSCULAR | Status: DC | PRN

## 2018-03-04 MED ORDER — ONDANSETRON HCL 4 MG/2ML IJ SOLN: 4.0000 mg | Freq: Four times a day (QID) | INTRAMUSCULAR | Status: DC | PRN

## 2018-03-04 MED ORDER — SODIUM CHLORIDE 0.9 % IV SOLN
500.0000 mL | Freq: Once | INTRAVENOUS | Status: AC | PRN
  Administered 2018-03-04: 500 mL via INTRAVENOUS

## 2018-03-04 MED ORDER — ONDANSETRON HCL 4 MG/2ML IJ SOLN
INTRAMUSCULAR | Status: DC | PRN
  Administered 2018-03-04: 4 mg via INTRAVENOUS

## 2018-03-04 MED ORDER — MORPHINE SULFATE (PF) 4 MG/ML IV SOLN: 4.0000 mg | INTRAVENOUS | Status: DC | PRN

## 2018-03-04 MED ORDER — DIPHENHYDRAMINE HCL 50 MG/ML IJ SOLN
INTRAMUSCULAR | Status: AC
  Filled 2018-03-04: qty 1

## 2018-03-04 MED ORDER — ALUM & MAG HYDROXIDE-SIMETH 200-200-20 MG/5ML PO SUSP: 15.0000 mL | ORAL | Status: DC | PRN

## 2018-03-04 MED ORDER — INSULIN DETEMIR 100 UNIT/ML ~~LOC~~ SOLN
15.0000 [IU] | Freq: Two times a day (BID) | SUBCUTANEOUS | Status: DC
  Administered 2018-03-04 (×2): 15 [IU] via SUBCUTANEOUS
  Filled 2018-03-04 (×3): qty 0.15

## 2018-03-04 MED ORDER — HEPARIN SODIUM (PORCINE) 5000 UNIT/ML IJ SOLN
INTRAMUSCULAR | Status: AC
  Filled 2018-03-04: qty 1.2

## 2018-03-04 MED ORDER — DEXTRAN 40 IN SALINE 10-0.9 % IV SOLN
INTRAVENOUS | Status: AC | PRN
  Administered 2018-03-04: 500 mL

## 2018-03-04 MED ORDER — ASPIRIN EC 81 MG PO TBEC
81.0000 mg | DELAYED_RELEASE_TABLET | Freq: Every day | ORAL | Status: DC
  Administered 2018-03-04: 81 mg via ORAL
  Filled 2018-03-04 (×2): qty 1

## 2018-03-04 MED ORDER — SODIUM CHLORIDE 0.9 % IV SOLN
INTRAVENOUS | Status: DC | PRN
  Administered 2018-03-04: 25 ug/min via INTRAVENOUS

## 2018-03-04 MED ORDER — VITAMIN D (ERGOCALCIFEROL) 1.25 MG (50000 UNIT) PO CAPS: 50000.0000 [IU] | ORAL_CAPSULE | ORAL | Status: DC

## 2018-03-04 MED ORDER — GUAIFENESIN-DM 100-10 MG/5ML PO SYRP: 15.0000 mL | ORAL_SOLUTION | ORAL | Status: DC | PRN

## 2018-03-04 MED ORDER — INSULIN ASPART 100 UNIT/ML ~~LOC~~ SOLN
0.0000 [IU] | Freq: Three times a day (TID) | SUBCUTANEOUS | Status: DC
  Administered 2018-03-04: 2 [IU] via SUBCUTANEOUS
  Administered 2018-03-04: 3 [IU] via SUBCUTANEOUS

## 2018-03-04 MED ORDER — ADULT MULTIVITAMIN W/MINERALS CH
1.0000 | ORAL_TABLET | ORAL | Status: DC
  Administered 2018-03-04: 1 via ORAL
  Filled 2018-03-04: qty 1

## 2018-03-04 MED ORDER — PROPOFOL 10 MG/ML IV BOLUS
INTRAVENOUS | Status: AC
  Filled 2018-03-04: qty 20

## 2018-03-04 MED ORDER — BISOPROLOL FUMARATE 5 MG PO TABS
5.0000 mg | ORAL_TABLET | Freq: Every morning | ORAL | Status: DC
  Filled 2018-03-04 (×2): qty 1

## 2018-03-04 MED ORDER — LABETALOL HCL 5 MG/ML IV SOLN: 10.0000 mg | INTRAVENOUS | Status: DC | PRN

## 2018-03-04 MED ORDER — LIDOCAINE HCL (PF) 1 % IJ SOLN
INTRAMUSCULAR | Status: AC
  Filled 2018-03-04: qty 5

## 2018-03-04 MED ORDER — FENTANYL CITRATE (PF) 250 MCG/5ML IJ SOLN
INTRAMUSCULAR | Status: DC | PRN
  Administered 2018-03-04 (×2): 50 ug via INTRAVENOUS

## 2018-03-04 MED ORDER — FENTANYL CITRATE (PF) 250 MCG/5ML IJ SOLN
INTRAMUSCULAR | Status: AC
Start: 2018-03-04 — End: ?
  Filled 2018-03-04: qty 5

## 2018-03-04 MED ORDER — LIDOCAINE-EPINEPHRINE (PF) 1 %-1:200000 IJ SOLN
INTRAMUSCULAR | Status: DC | PRN
  Administered 2018-03-04: 30 mL

## 2018-03-04 MED ORDER — ROCURONIUM BROMIDE 100 MG/10ML IV SOLN
INTRAVENOUS | Status: DC | PRN
  Administered 2018-03-04 (×2): 50 mg via INTRAVENOUS

## 2018-03-04 MED ORDER — DEXAMETHASONE SODIUM PHOSPHATE 10 MG/ML IJ SOLN
INTRAMUSCULAR | Status: DC | PRN
  Administered 2018-03-04: 4 mg via INTRAVENOUS

## 2018-03-04 MED ORDER — CLINDAMYCIN PHOSPHATE 300 MG/50ML IV SOLN
300.0000 mg | Freq: Three times a day (TID) | INTRAVENOUS | Status: AC
  Administered 2018-03-04 (×2): 300 mg via INTRAVENOUS
  Filled 2018-03-04 (×2): qty 50

## 2018-03-04 MED ORDER — CHLORHEXIDINE GLUCONATE 4 % EX LIQD: 60.0000 mL | Freq: Once | CUTANEOUS | Status: DC

## 2018-03-04 MED ORDER — ROCURONIUM BROMIDE 10 MG/ML (PF) SYRINGE
PREFILLED_SYRINGE | INTRAVENOUS | Status: AC
  Filled 2018-03-04: qty 10

## 2018-03-04 MED ORDER — HEPARIN SODIUM (PORCINE) 1000 UNIT/ML IJ SOLN
INTRAMUSCULAR | Status: DC | PRN
  Administered 2018-03-04: 11000 [IU] via INTRAVENOUS

## 2018-03-04 MED ORDER — PHENOL 1.4 % MT LIQD: 1.0000 | OROMUCOSAL | Status: DC | PRN

## 2018-03-04 MED ORDER — ROSUVASTATIN CALCIUM 10 MG PO TABS
40.0000 mg | ORAL_TABLET | Freq: Every day | ORAL | Status: DC
  Administered 2018-03-04: 40 mg via ORAL
  Filled 2018-03-04: qty 4

## 2018-03-04 MED ORDER — SUGAMMADEX SODIUM 200 MG/2ML IV SOLN
INTRAVENOUS | Status: DC | PRN
  Administered 2018-03-04: 200 mg via INTRAVENOUS

## 2018-03-04 MED ORDER — BISOPROLOL FUMARATE 5 MG PO TABS
5.0000 mg | ORAL_TABLET | Freq: Every day | ORAL | Status: DC
  Administered 2018-03-04: 5 mg via ORAL
  Filled 2018-03-04: qty 1

## 2018-03-04 MED ORDER — METOPROLOL TARTRATE 5 MG/5ML IV SOLN: 2.0000 mg | INTRAVENOUS | Status: DC | PRN

## 2018-03-04 MED ORDER — POTASSIUM CHLORIDE CRYS ER 20 MEQ PO TBCR: 20.0000 meq | EXTENDED_RELEASE_TABLET | Freq: Every day | ORAL | Status: DC | PRN

## 2018-03-04 MED ORDER — HEPARIN SODIUM (PORCINE) 1000 UNIT/ML IJ SOLN
INTRAMUSCULAR | Status: AC
  Filled 2018-03-04: qty 1

## 2018-03-04 MED ORDER — HYDRALAZINE HCL 20 MG/ML IJ SOLN: 5.0000 mg | INTRAMUSCULAR | Status: DC | PRN

## 2018-03-04 MED ORDER — 0.9 % SODIUM CHLORIDE (POUR BTL) OPTIME
TOPICAL | Status: DC | PRN
  Administered 2018-03-04: 2000 mL

## 2018-03-04 MED ORDER — SODIUM CHLORIDE 0.9 % IV SOLN
INTRAVENOUS | Status: DC | PRN
  Administered 2018-03-04: 07:00:00

## 2018-03-04 SURGICAL SUPPLY — 41 items
BAG DECANTER FOR FLEXI CONT (MISCELLANEOUS) ×3 IMPLANT
CANISTER SUCT 3000ML PPV (MISCELLANEOUS) ×3 IMPLANT
CANNULA VESSEL 3MM 2 BLNT TIP (CANNULA) ×9 IMPLANT
CATH ROBINSON RED A/P 18FR (CATHETERS) ×3 IMPLANT
CLIP VESOCCLUDE MED 24/CT (CLIP) ×3 IMPLANT
CLIP VESOCCLUDE SM WIDE 24/CT (CLIP) ×3 IMPLANT
CRADLE DONUT ADULT HEAD (MISCELLANEOUS) ×3 IMPLANT
DERMABOND ADVANCED (GAUZE/BANDAGES/DRESSINGS) ×2
DERMABOND ADVANCED .7 DNX12 (GAUZE/BANDAGES/DRESSINGS) ×1 IMPLANT
DRAIN CHANNEL 15F RND FF W/TCR (WOUND CARE) IMPLANT
ELECT REM PT RETURN 9FT ADLT (ELECTROSURGICAL) ×3
ELECTRODE REM PT RTRN 9FT ADLT (ELECTROSURGICAL) ×1 IMPLANT
EVACUATOR SILICONE 100CC (DRAIN) IMPLANT
GLOVE BIO SURGEON STRL SZ7.5 (GLOVE) ×6 IMPLANT
GLOVE BIOGEL PI IND STRL 7.0 (GLOVE) ×3 IMPLANT
GLOVE BIOGEL PI IND STRL 8 (GLOVE) ×1 IMPLANT
GLOVE BIOGEL PI INDICATOR 7.0 (GLOVE) ×6
GLOVE BIOGEL PI INDICATOR 8 (GLOVE) ×2
GLOVE ECLIPSE 6.5 STRL STRAW (GLOVE) ×3 IMPLANT
GLOVE SURG SS PI 7.0 STRL IVOR (GLOVE) ×3 IMPLANT
GOWN STRL REUS W/ TWL LRG LVL3 (GOWN DISPOSABLE) ×3 IMPLANT
GOWN STRL REUS W/TWL LRG LVL3 (GOWN DISPOSABLE) ×6
KIT BASIN OR (CUSTOM PROCEDURE TRAY) ×3 IMPLANT
KIT SHUNT ARGYLE CAROTID ART 6 (VASCULAR PRODUCTS) IMPLANT
KIT TURNOVER KIT B (KITS) ×3 IMPLANT
NEEDLE HYPO 25GX1X1/2 BEV (NEEDLE) ×3 IMPLANT
NS IRRIG 1000ML POUR BTL (IV SOLUTION) ×6 IMPLANT
PACK CAROTID (CUSTOM PROCEDURE TRAY) ×3 IMPLANT
PAD ARMBOARD 7.5X6 YLW CONV (MISCELLANEOUS) ×6 IMPLANT
SHUNT CAROTID BYPASS 10 (VASCULAR PRODUCTS) IMPLANT
SHUNT CAROTID BYPASS 12 (VASCULAR PRODUCTS) ×3 IMPLANT
SPONGE SURGIFOAM ABS GEL 100 (HEMOSTASIS) IMPLANT
SUT PROLENE 6 0 BV (SUTURE) ×9 IMPLANT
SUT SILK 2 0 PERMA HAND 18 BK (SUTURE) IMPLANT
SUT VIC AB 3-0 SH 27 (SUTURE) ×2
SUT VIC AB 3-0 SH 27X BRD (SUTURE) ×1 IMPLANT
SUT VICRYL 4-0 PS2 18IN ABS (SUTURE) ×3 IMPLANT
SYR 20CC LL (SYRINGE) ×3 IMPLANT
SYR CONTROL 10ML LL (SYRINGE) ×3 IMPLANT
TOWEL GREEN STERILE (TOWEL DISPOSABLE) ×3 IMPLANT
WATER STERILE IRR 1000ML POUR (IV SOLUTION) ×3 IMPLANT

## 2018-03-04 NOTE — Transfer of Care (Signed)
Immediate Anesthesia Transfer of Care Note  Patient: Paul Mcneil  Procedure(s) Performed: ENDARTERECTOMY CAROTID RIGHT WITH PRIMARY CLOSURE OF ARTERY (Right Neck)  Patient Location: PACU  Anesthesia Type:General  Level of Consciousness: awake, alert  and oriented  Airway & Oxygen Therapy: Patient Spontanous Breathing and Patient connected to nasal cannula oxygen  Post-op Assessment: Report given to RN, Post -op Vital signs reviewed and stable, Patient moving all extremities X 4 and Patient able to stick tongue midline  Post vital signs: Reviewed and stable  Last Vitals:  Vitals Value Taken Time  BP 105/47 03/04/2018  9:35 AM  Temp    Pulse 57 03/04/2018  9:37 AM  Resp 14 03/04/2018  9:37 AM  SpO2 97 % 03/04/2018  9:37 AM  Vitals shown include unvalidated device data.  Last Pain:  Vitals:   03/04/18 0555  TempSrc:   PainSc: 0-No pain      Patients Stated Pain Goal: 0 (53/20/23 3435)  Complications: No apparent anesthesia complications

## 2018-03-04 NOTE — Op Note (Signed)
    NAME: Paul Mcneil    MRN: [id number] DOB: 06-17-1946    DATE OF OPERATION: 03/04/2018  PREOP DIAGNOSIS:    Symptomatic right carotid stenosis  POSTOP DIAGNOSIS:    Same  PROCEDURE:    Right carotid endarterectomy with primary closure  SURGEON: Judeth Cornfield. Scot Dock, MD, FACS  ASSIST: Arlee Muslim, PA  ANESTHESIA: General  EBL: Minimal  INDICATIONS:    Paul Mcneil is a 71 y.o. male who had been following with a moderate right carotid stenosis.  This progressed to greater than 80%.  In addition on his history it sounded like he had an episode of right eye amaurosis fugax and also some transient episodes of weakness in his left arm.  He presents for elective right carotid endarterectomy.  Of note he is undergone previous left carotid endarterectomy last year at the time of his CABG.  FINDINGS:   Hemorrhagic plaque producing a 90% right carotid stenosis the artery was quite large and therefore elected to close this primarily.  TECHNIQUE:   The patient was taken to the operating room and received a general anesthetic.  An arterial line had been placed by anesthesia.  The right neck was prepped and draped in usual sterile fashion after careful positioning.  An incision was made along the anterior border of the sternocleidomastoid and the dissection carried down to the common carotid artery which was dissected free and controlled with Rummel tourniquet.  The patient was heparinized.  The facial vein was divided between 2-0 silk ties.  The internal carotid artery was controlled above the plaque with a red loop in the superior thyroid artery and external carotid arteries were controlled.  Clamps were then placed on the internal then the common and the external carotid artery.  A longitudinal arteriotomy was made in the common carotid artery and this was extended through the plaque into the internal carotid artery.  A 12 shunt was placed into the internal carotid artery  easily.  This was backbled and then placed into the common carotid artery and secured with Rummel tourniquet.  Flow was reestablished to the shunt.  Flow within the shunt was confirmed with a Doppler.  An endarterectomy plane was established proximally and the plaque was sharply divided.  Eversion endarterectomy was performed of the external carotid artery.  Distally there was a nice tapering the plaque and no tacking sutures were required.  The artery was irrigated with copious amounts of heparin and dextran.  The artery was then closed primarily.  Prior to completing the closure the shunt was removed.  The arteries were backbled and flushed appropriately and the anastomosis completed.  Flow was reestablished first to the external carotid artery and then to the internal carotid artery.  At the completion there was an excellent Doppler signal with good diastolic flow distal to the repair and a good pulse.  Hemostasis was obtained in the wound and the heparin was partially reversed with protamine.  The wound was closed with a deep layer of 3-0 Vicryl, the platysma was closed with running 3-0 Vicryl, the skin was closed with 4-0 Vicryl.  Dermabond was applied.  The patient tolerated the procedure well was transferred to recovery room in stable condition.  He awoke neurologically intact.  All needle and sponge counts were correct.  [name], MD, FACS [facility]  DATE OF DICTATION:   03/04/2018

## 2018-03-04 NOTE — Progress Notes (Signed)
   VASCULAR SURGERY POST OP NOTE:   Stable postop.  Anticipate discharge in a.m.   SUBJECTIVE:   No complaints.  PHYSICAL EXAM:   Vitals:   03/04/18 1000 03/04/18 1015 03/04/18 1030 03/04/18 1039  BP: (!) 109/59   (!) 104/44  Pulse: (!) 54 (!) 50 (!) 50 (!) 49  Resp: 11 14 15  (!) 9  Temp:   (!) 97.2 F (36.2 C) 97.8 F (36.6 C)  TempSrc:      SpO2: 97% 92% 94% 96%  Weight:      Height:       His right neck incision looks fine. No focal weakness or paresthesias.  LABS:   CBG (last 3)  Recent Labs    03/04/18 0938 03/04/18 1110 03/04/18 1617  GLUCAP 112* 131* 154*    PROBLEM LIST:    Active Problems:   Carotid stenosis   CURRENT MEDS:   . aspirin EC  81 mg Oral Daily  . bisoprolol  5 mg Oral q morning - 10a  . [START ON 03/05/2018] docusate sodium  100 mg Oral Daily  . insulin aspart  0-15 Units Subcutaneous TID WC  . insulin detemir  15 Units Subcutaneous BID  . multivitamin with minerals  1 tablet Oral QODAY  . pantoprazole  40 mg Oral Daily  . rosuvastatin  40 mg Oral q1800  . [START ON 03/06/2018] Vitamin D (Ergocalciferol)  50,000 Units Oral Q30 days    Javien Tesch Beeper: [phone] Office: [phone] 03/04/2018

## 2018-03-04 NOTE — Anesthesia Procedure Notes (Signed)
Arterial Line Insertion Start/End8/13/2019 7:10 AM, 03/04/2018 7:15 AM Performed by: CRNA  Patient location: Pre-op. Preanesthetic checklist: patient identified, IV checked, site marked, risks and benefits discussed, surgical consent, monitors and equipment checked, pre-op evaluation, timeout performed and anesthesia consent Lidocaine 1% used for infiltration Left, radial was placed Catheter size: 20 G Hand hygiene performed  and maximum sterile barriers used   Attempts: 1 Procedure performed without using ultrasound guided technique. Following insertion, Biopatch and dressing applied. Post procedure assessment: normal  Patient tolerated the procedure well with no immediate complications.

## 2018-03-04 NOTE — Anesthesia Procedure Notes (Signed)
Procedure Name: Intubation Date/Time: 03/04/2018 7:43 AM Performed by: Mariea Clonts, CRNA Pre-anesthesia Checklist: Patient identified, Emergency Drugs available, Suction available and Patient being monitored Patient Re-evaluated:Patient Re-evaluated prior to induction Oxygen Delivery Method: Circle System Utilized Preoxygenation: Pre-oxygenation with 100% oxygen Induction Type: IV induction Ventilation: Mask ventilation without difficulty and Oral airway inserted - appropriate to patient size Laryngoscope Size: Sabra Heck and 3 Grade View: Grade I Tube type: Oral Tube size: 8.0 mm Number of attempts: 1 Airway Equipment and Method: Stylet and Oral airway Placement Confirmation: ETT inserted through vocal cords under direct vision,  positive ETCO2 and breath sounds checked- equal and bilateral Tube secured with: Tape Dental Injury: Teeth and Oropharynx as per pre-operative assessment

## 2018-03-04 NOTE — Discharge Instructions (Signed)
° °  [facility] ° °Discharge Instructions °  °Carotid Endarterectomy (CEA) ° °Please refer to the following instructions for your post-procedure care. Your surgeon or physician assistant will discuss any changes with you. ° °Activity ° °You are encouraged to walk as much as you can. You can slowly return to normal activities but must avoid strenuous activity and heavy lifting until your doctor tell you it's okay. Avoid activities such as vacuuming or swinging a golf club. You can drive after one week if you are comfortable and you are no longer taking prescription pain medications. It is normal to feel tired for serval weeks after your surgery. It is also normal to have difficulty with sleep habits, eating, and bowel movements after surgery. These will go away with time. ° °Bathing/Showering ° °Shower daily after you go home. Do not soak in a bathtub, hot tub, or swim until the incision heals completely. ° °Incision Care ° °Shower every day. Clean your incision with mild soap and water. Pat the area dry with a clean towel. You do not need a bandage unless otherwise instructed. Do not apply any ointments or creams to your incision. You may have skin glue on your incision. Do not peel it off. It will come off on its own in about one week. Your incision may feel thickened and raised for several weeks after your surgery. This is normal and the skin will soften over time.  ° °For Men Only: It's okay to shave around the incision but do not shave the incision itself for 2 weeks. It is common to have numbness under your chin that could last for several months. ° °Diet ° °Resume your normal diet. There are no special food restrictions following this procedure. A low fat/low cholesterol diet is recommended for all patients with vascular disease. In order to heal from your surgery, it is CRITICAL to get adequate nutrition. Your body requires vitamins, minerals, and protein. Vegetables are the  best source of vitamins and minerals. Vegetables also provide the perfect balance of protein. Processed food has little nutritional value, so try to avoid this. ° °Medications ° °Resume taking all of your medications unless your doctor or physician assistant tells you not to. If your incision is causing pain, you may take over-the- counter pain relievers such as acetaminophen (Tylenol). If you were prescribed a stronger pain medication, please be aware these medications can cause nausea and constipation. Prevent nausea by taking the medication with a snack or meal. Avoid constipation by drinking plenty of fluids and eating foods with a high amount of fiber, such as fruits, vegetables, and grains.  °Do not take Tylenol if you are taking prescription pain medications. ° °Follow Up ° °Our office will schedule a follow up appointment 2-3 weeks following discharge. ° °Please call us immediately for any of the following conditions ° °Increased pain, redness, drainage (pus) from your incision site. °Fever of 101 degrees or higher. °If you should develop stroke (slurred speech, difficulty swallowing, weakness on one side of your body, loss of vision) you should call 911 and go to the nearest emergency room. ° °Reduce your risk of vascular disease: ° °Stop smoking. If you would like help call [facility] at 1-800-QUIT-NOW (1-800-784-8669) or [facility] at [phone]. °Manage your cholesterol °Maintain a desired weight °Control your diabetes °Keep your blood pressure down ° °If you have any questions, please call the office at [phone]. ° °

## 2018-03-04 NOTE — Anesthesia Postprocedure Evaluation (Signed)
Anesthesia Post Note  Patient: Paul Mcneil  Procedure(s) Performed: ENDARTERECTOMY CAROTID RIGHT WITH PRIMARY CLOSURE OF ARTERY (Right Neck)     Patient location during evaluation: PACU Anesthesia Type: General Level of consciousness: awake and alert Pain management: pain level controlled Vital Signs Assessment: post-procedure vital signs reviewed and stable Respiratory status: spontaneous breathing, nonlabored ventilation, respiratory function stable and patient connected to nasal cannula oxygen Cardiovascular status: blood pressure returned to baseline and stable Postop Assessment: no apparent nausea or vomiting Anesthetic complications: no    Last Vitals:  Vitals:   03/04/18 1030 03/04/18 1039  BP:  (!) 104/44  Pulse: (!) 50 (!) 49  Resp: 15 (!) 9  Temp: (!) 36.2 C 36.6 C  SpO2: 94% 96%    Last Pain:  Vitals:   03/04/18 1122  TempSrc:   PainSc: 5                  Tiajuana Amass

## 2018-03-04 NOTE — Progress Notes (Signed)
Patient admitted from PACU, A&Ox4, VSS. Telemetry applied and CCMD notified.

## 2018-03-04 NOTE — Interval H&P Note (Signed)
History and Physical Interval Note:  03/04/2018 7:19 AM  Paul Mcneil  has presented today for surgery, with the diagnosis of RIGHT CAROTID STENOSIS  The various methods of treatment have been discussed with the patient and family. After consideration of risks, benefits and other options for treatment, the patient has consented to  Procedure(s): ENDARTERECTOMY CAROTID RIGHT (Right) as a surgical intervention .  The patient's history has been reviewed, patient examined, no change in status, stable for surgery.  I have reviewed the patient's chart and labs.  Questions were answered to the patient's satisfaction.     [name]

## 2018-03-05 ENCOUNTER — Encounter (HOSPITAL_COMMUNITY): Payer: Self-pay | Admitting: Vascular Surgery

## 2018-03-05 LAB — GLUCOSE, CAPILLARY: GLUCOSE-CAPILLARY: 171 mg/dL — AB (ref 70–99)

## 2018-03-05 LAB — CBC
HCT: 33 % — ABNORMAL LOW (ref 39.0–52.0)
HEMOGLOBIN: 10.6 g/dL — AB (ref 13.0–17.0)
MCH: 28 pg (ref 26.0–34.0)
MCHC: 32.1 g/dL (ref 30.0–36.0)
MCV: 87.1 fL (ref 78.0–100.0)
Platelets: 161 10*3/uL (ref 150–400)
RBC: 3.79 MIL/uL — ABNORMAL LOW (ref 4.22–5.81)
RDW: 13.6 % (ref 11.5–15.5)
WBC: 7.9 10*3/uL (ref 4.0–10.5)

## 2018-03-05 LAB — BASIC METABOLIC PANEL
Anion gap: 7 (ref 5–15)
BUN: 38 mg/dL — ABNORMAL HIGH (ref 8–23)
CHLORIDE: 112 mmol/L — AB (ref 98–111)
CO2: 18 mmol/L — ABNORMAL LOW (ref 22–32)
CREATININE: 2.42 mg/dL — AB (ref 0.61–1.24)
Calcium: 8.5 mg/dL — ABNORMAL LOW (ref 8.9–10.3)
GFR calc non Af Amer: 25 mL/min — ABNORMAL LOW (ref >60)
GFR, EST AFRICAN AMERICAN: 29 mL/min — AB (ref >60)
Glucose, Bld: 207 mg/dL — ABNORMAL HIGH (ref 70–99)
POTASSIUM: 4.3 mmol/L (ref 3.5–5.1)
SODIUM: 137 mmol/L (ref 135–145)

## 2018-03-05 MED ORDER — OXYCODONE-ACETAMINOPHEN 5-325 MG PO TABS: 1.0000 | ORAL_TABLET | Freq: Four times a day (QID) | ORAL | 0 refills | Status: DC | PRN

## 2018-03-05 NOTE — Plan of Care (Signed)
  Problem: Health Behavior/Discharge Planning: Goal: Ability to manage health-related needs will improve Outcome: Adequate for Discharge   Problem: Clinical Measurements: Goal: Ability to maintain clinical measurements within normal limits will improve Outcome: Adequate for Discharge Goal: Will remain free from infection Outcome: Adequate for Discharge Goal: Diagnostic test results will improve Outcome: Adequate for Discharge Goal: Respiratory complications will improve Outcome: Adequate for Discharge Goal: Cardiovascular complication will be avoided Outcome: Adequate for Discharge   Problem: Activity: Goal: Risk for activity intolerance will decrease Outcome: Adequate for Discharge   Problem: Coping: Goal: Level of anxiety will decrease Outcome: Adequate for Discharge   Problem: Elimination: Goal: Will not experience complications related to bowel motility Outcome: Adequate for Discharge Goal: Will not experience complications related to urinary retention Outcome: Adequate for Discharge   Problem: Pain Managment: Goal: General experience of comfort will improve Outcome: Adequate for Discharge

## 2018-03-05 NOTE — Progress Notes (Signed)
   VASCULAR SURGERY ASSESSMENT & PLAN:   1 Day Post-Op s/p: Right CEA  Doing well.   Home today.  VQI: He is on aspirin and is on a statin.   SUBJECTIVE:   No complaints this morning.  PHYSICAL EXAM:   Vitals:   03/04/18 1700 03/04/18 2022 03/05/18 0100 03/05/18 0438  BP:  (!) 127/53 (!) 133/56 (!) 100/49  Pulse: (!) 50   (!) 58  Resp: 12 18  16   Temp:  97.8 F (36.6 C) 98.8 F (37.1 C) 98.2 F (36.8 C)  TempSrc:  Oral  Oral  SpO2: 99% 97%  95%  Weight:      Height:       His right neck incision looks fine. No focal weakness or paresthesias on exam.  LABS:   Lab Results  Component Value Date   WBC 7.9 03/05/2018   HGB 10.6 (L) 03/05/2018   HCT 33.0 (L) 03/05/2018   MCV 87.1 03/05/2018   PLT 161 03/05/2018   Lab Results  Component Value Date   CREATININE 2.42 (H) 03/05/2018   Lab Results  Component Value Date   INR 1.02 02/24/2018   CBG (last 3)  Recent Labs    03/04/18 1617 03/04/18 2154 03/05/18 0558  GLUCAP 154* 175* 171*    PROBLEM LIST:    Active Problems:   Carotid stenosis   CURRENT MEDS:   . aspirin EC  81 mg Oral Daily  . bisoprolol  5 mg Oral q morning - 10a  . docusate sodium  100 mg Oral Daily  . insulin aspart  0-15 Units Subcutaneous TID WC  . insulin detemir  15 Units Subcutaneous BID  . multivitamin with minerals  1 tablet Oral QODAY  . pantoprazole  40 mg Oral Daily  . rosuvastatin  40 mg Oral q1800  . [START ON 03/06/2018] Vitamin D (Ergocalciferol)  50,000 Units Oral Q30 days    Paul Mcneil Beeper: [phone] Office: [phone] 03/05/2018

## 2018-03-05 NOTE — Progress Notes (Signed)
Discharge instructions reviewed with patient and daughter and prescription given. He denies questions or concerns and agrees to all instructions and f/u appts. He is discharged via wheelchair with volunteer to family vehicle.

## 2018-03-05 NOTE — Discharge Summary (Signed)
Discharge Summary     Paul Mcneil 10/28/1945 72 y.o. male  [id number]  Admission Date: 03/04/2018  Discharge Date: 03/05/18  Physician: Gerhardt, Gleed, *  Admission Diagnosis: RIGHT CAROTID STENOSIS  Discharge Day services:    see progress note 03/05/18 Physical Exam: Vitals:   03/05/18 0438 03/05/18 0800  BP: (!) 100/49 116/82  Pulse: (!) 58 77  Resp: 16 18  Temp: 98.2 F (36.8 C) 98.1 F (36.7 C)  SpO2: 95% 100%    Hospital Course:  The patient was admitted to the hospital and taken to the operating room on 03/04/2018 and underwent right carotid endarterectomy.  The pt tolerated the procedure well and was transported to the PACU in good condition.   By POD 1, the pt neuro status remains at baseline.  No further R eye changes or left arm weakness.  The remainder of the hospital course consisted of increasing mobilization and increasing intake of solids without difficulty.  He will be prescribed 2-3 days of narcotic pain medication for continued post operative pain control.  He will follow up in 2 weeks to see Dr. Scot Dock.  Discharge instructions were reviewed with the patient and he voices his understanding.  He will be discharged this morning in stable condition.   Recent Labs    03/05/18 0220  NA 137  K 4.3  CL 112*  CO2 18*  GLUCOSE 207*  BUN 38*  CALCIUM 8.5*   Recent Labs    03/05/18 0220  WBC 7.9  HGB 10.6*  HCT 33.0*  PLT 161   No results for input(s): INR in the last 72 hours.     Discharge Diagnosis:  RIGHT CAROTID STENOSIS  Secondary Diagnosis: Patient Active Problem List   Diagnosis Date Noted  . Carotid stenosis 03/04/2018  . Essential hypertension 03/11/2017  . 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 CKD (chronic kidney disease) stage 3, GFR 30-59 ml/min (HCC) 02/05/2017  . Diabetes ([place]) 02/01/2017  . Carotid artery disease ([place]) 02/01/2017  . Unstable angina ([place]) 01/31/2017  . Dyslipidemia, goal LDL below 70 01/31/2017    . CAD, multiple vessel 01/30/2017  . Renal insufficiency 01/24/2017  . Elevated blood sugar 01/24/2017  . COPD GOLD II 01/11/2017  . Solitary pulmonary nodule on lung CT 01/11/2017  . Claudication of both lower extremities ([place]) 01/03/2017  . Bilateral carotid bruits 01/03/2017  . Dyspnea on exertion 01/03/2017  . Abnormal electrocardiogram (ECG) (EKG) 01/03/2017  . Acute appendicitis with localized peritonitis 12/11/2013   Past Medical History:  Diagnosis Date  . Anemia 1964  . Arthritis    "touch starting to show up in the joints" (12/11/2013)  . CAD (coronary artery disease), native coronary artery    a. Cath 01/30/17 showing multivessel CAD --> underwent CABG on 02/15/2017 with LIMA-LAD, SVG-PDA, and SVG-[place]-OM1.   . Carotid artery calcification    a. 01/2017: s/p L CEA performed at time of CABG; Existing Mod-Severe R Carotid stenosis  . Chronic kidney disease    "my kidneys are down to 30% function"  . COPD (chronic obstructive pulmonary disease) ([place])    "they said I had it"  . Diabetes ([place])   . Dyspnea    with exertion  . Former heavy tobacco smoker   . GERD (gastroesophageal reflux disease)    Dr. Michail Sermon  . History of alcohol abuse   . Hyperlipemia     Allergies as of 03/05/2018      Reactions   Ceclor [cefaclor] Anaphylaxis, Hives, Other (  See Comments)   BASED ON CRITERIA FOR ANAPHYLAXIS HIVES + HYPOTENSION      Medication List    TAKE these medications   acetaminophen 500 MG tablet Commonly known as:  TYLENOL Take 500 mg by mouth every 6 (six) hours as needed (for pain.).   aspirin EC 81 MG tablet Take 1 tablet (81 mg total) by mouth daily. What changed:    how much to take  when to take this   bisoprolol 5 MG tablet Commonly known as:  ZEBETA Take 1 tablet (5 mg total) by mouth daily. What changed:  when to take this   esomeprazole 40 MG capsule Commonly known as:  NEXIUM Take 1 capsule (40 mg total) by mouth daily as needed (for acid  reflux).   LEVEMIR FLEXTOUCH 100 UNIT/ML Pen Generic drug:  Insulin Detemir Inject 15 Units into the skin 2 (two) times daily.   multivitamin with minerals Tabs tablet Take 1 tablet by mouth every other day. Advanced Senior Formula   oxyCODONE-acetaminophen 5-325 MG tablet Commonly known as:  PERCOCET/ROXICET Take 1 tablet by mouth every 6 (six) hours as needed for moderate pain.   RESTORA RX 60-1.25 MG Caps Generic drug:  Lactobacillus Casei-Folic Acid Take 1 tablet by mouth daily as needed (for digestive health).   rosuvastatin 40 MG tablet Commonly known as:  CRESTOR Take 1 tablet (40 mg total) by mouth daily. What changed:  when to take this   Vitamin D (Ergocalciferol) 50000 units Caps capsule Commonly known as:  DRISDOL Take 50,000 Units by mouth every 30 (thirty) days.        Discharge Instructions:   [facility] Discharge Instructions Carotid Endarterectomy (CEA)  Please refer to the following instructions for your post-procedure care. Your surgeon or physician assistant will discuss any changes with you.  Activity  You are encouraged to walk as much as you can. You can slowly return to normal activities but must avoid strenuous activity and heavy lifting until your doctor tell you it's OK. Avoid activities such as vacuuming or swinging a golf club. You can drive after one week if you are comfortable and you are no longer taking prescription pain medications. It is normal to feel tired for serval weeks after your surgery. It is also normal to have difficulty with sleep habits, eating, and bowel movements after surgery. These will go away with time.  Bathing/Showering  You may shower after you come home. Do not soak in a bathtub, hot tub, or swim until the incision heals completely.  Incision Care  Shower every day. Clean your incision with mild soap and water. Pat the area dry with a clean towel. You do not need a bandage unless  otherwise instructed. Do not apply any ointments or creams to your incision. You may have skin glue on your incision. Do not peel it off. It will come off on its own in about one week. Your incision may feel thickened and raised for several weeks after your surgery. This is normal and the skin will soften over time. For Men Only: It's OK to shave around the incision but do not shave the incision itself for 2 weeks. It is common to have numbness under your chin that could last for several months.  Diet  Resume your normal diet. There are no special food restrictions following this procedure. A low fat/low cholesterol diet is recommended for all patients with vascular disease. In order to heal from your surgery, it  is CRITICAL to get adequate nutrition. Your body requires vitamins, minerals, and protein. Vegetables are the best source of vitamins and minerals. Vegetables also provide the perfect balance of protein. Processed food has little nutritional value, so try to avoid this.  Medications  Resume taking all of your medications unless your doctor or physician assistant tells you not to.  If your incision is causing pain, you may take over-the- counter pain relievers such as acetaminophen (Tylenol). If you were prescribed a stronger pain medication, please be aware these medications can cause nausea and constipation.  Prevent nausea by taking the medication with a snack or meal. Avoid constipation by drinking plenty of fluids and eating foods with a high amount of fiber, such as fruits, vegetables, and grains. Do not take Tylenol if you are taking prescription pain medications.  Follow Up  Our office will schedule a follow up appointment 2-3 weeks following discharge.  Please call us immediately for any of the following conditions  Increased pain, redness, drainage (pus) from your incision site. Fever of 101 degrees or higher. If you should develop stroke (slurred speech, difficulty swallowing,  weakness on one side of your body, loss of vision) you should call 911 and go to the nearest emergency room.  Reduce your risk of vascular disease:  Stop smoking. If you would like help call [facility] at 1-800-QUIT-NOW [phone]) or [place] at [phone]. Manage your cholesterol Maintain a desired weight Control your diabetes Keep your blood pressure down  If you have any questions, please call the office at [phone].   Disposition: home  Patient's condition: is Good  Follow up: 1. Dr. Scot Dock in 2 weeks.   Dagoberto Ligas, PA-C [facility] [phone]   --- For [facility] Registry use ---   Modified Rankin score at D/C (0-6): 0  IV medication needed for:  1. Hypertension: No 2. Hypotension: No  Post-op Complications: No  1. Post-op CVA or TIA: No  If yes: Event classification (right eye, left eye, right cortical, left cortical, verterobasilar, other):   If yes: Timing of event (intra-op, <6 hrs post-op, >=6 hrs post-op, unknown):   2. CN injury: No  If yes: CN  injuried   3. Myocardial infarction: No  If yes: Dx by (EKG or clinical, Troponin):   4.  CHF: No  5.  Dysrhythmia (new): No  6. Wound infection: No  7. Reperfusion symptoms: No  8. Return to OR: No  If yes: return to OR for (bleeding, neurologic, other CEA incision, other):   Discharge medications: Statin use:  Yes ASA use:  Yes   Beta blocker use:  Yes ACE-Inhibitor use:  No  ARB use:  No CCB use: No P2Y12 Antagonist use: No, [ ]  Plavix, [ ]  Plasugrel, [ ]  Ticlopinine, [ ]  Ticagrelor, [ ]  Other, [ ]  No for medical reason, [ ]  Non-compliant, [ ]  Not-indicated Anti-coagulant use:  No, [ ]  Warfarin, [ ]  Rivaroxaban, [ ]  Dabigatran,

## 2018-03-06 ENCOUNTER — Telehealth: Payer: Self-pay | Admitting: Vascular Surgery

## 2018-03-06 NOTE — Telephone Encounter (Signed)
Sched. Appt. Spoke with pt. CSD 03/19/18 1:30pm p/o MD

## 2018-03-19 ENCOUNTER — Encounter: Payer: Medicare Other | Admitting: Vascular Surgery

## 2018-03-19 ENCOUNTER — Telehealth: Payer: Self-pay

## 2018-03-19 NOTE — Telephone Encounter (Signed)
Pt called to reschedule post-op apt. Apt has been rescheduled for the second time d/t transportation issues. Pt states he is fine and incision looks good. Pt states he will call if he has problems prior to Oct 16,2019 apt.

## 2018-04-09 ENCOUNTER — Other Ambulatory Visit: Payer: Self-pay | Admitting: Cardiology

## 2018-04-09 NOTE — Telephone Encounter (Signed)
Pt pharmacy calling to request a refill on bisoprolol. Please address

## 2018-04-10 MED ORDER — BISOPROLOL FUMARATE 5 MG PO TABS: 5.0000 mg | ORAL_TABLET | Freq: Every morning | ORAL | 1 refills | Status: DC

## 2018-04-10 MED ORDER — ROSUVASTATIN CALCIUM 40 MG PO TABS: 40.0000 mg | ORAL_TABLET | Freq: Every day | ORAL | 1 refills | Status: DC

## 2018-05-07 ENCOUNTER — Ambulatory Visit (INDEPENDENT_AMBULATORY_CARE_PROVIDER_SITE_OTHER): Payer: Medicare Other | Admitting: Vascular Surgery

## 2018-05-07 ENCOUNTER — Encounter: Payer: Self-pay | Admitting: Vascular Surgery

## 2018-05-07 VITALS — BP 121/78 | HR 64 | Temp 96.6°F | Resp 16 | Ht 73.0 in | Wt 240.0 lb

## 2018-05-07 DIAGNOSIS — I6523 Occlusion and stenosis of bilateral carotid arteries: Secondary | ICD-10-CM

## 2018-05-07 NOTE — Progress Notes (Signed)
   Patient name: Paul Mcneil MRN: [id number] DOB: 1946/01/18 Sex: male  REASON FOR VISIT:   Follow-up after right carotid endarterectomy.  HPI:   Paul Mcneil is a pleasant 72 y.o. male who I been following with a moderate right carotid stenosis.  This progressed to greater than 80% area in addition he had a history which sounded like amaurosis fugax in the right eye.  Right carotid endarterectomy was recommended.  He underwent a right carotid endarterectomy with primary closure on 03/04/2018.  He comes in for his first outpatient visit.  Of note his preoperative duplex showed no significant carotid stenosis on the left.  Since I saw him in the hospital he is doing well with no complaints of weakness or paresthesias.  He has no history of amaurosis fugax and surgery.  He is on aspirin and is on a statin.  Current Outpatient Medications  Medication Sig Dispense Refill  . aspirin 81 MG tablet Take 1 tablet (81 mg total) by mouth daily. (Patient taking differently: Take 162 mg by mouth every morning. ) 30 tablet 6  . bisoprolol (ZEBETA) 5 MG tablet Take 1 tablet (5 mg total) by mouth every morning. 90 tablet 1  . esomeprazole (NEXIUM) 40 MG capsule Take 1 capsule (40 mg total) by mouth daily as needed (for acid reflux). 90 capsule 3  . Lactobacillus Casei-Folic Acid (RESTORA RX) 60-1.25 MG CAPS Take 1 tablet by mouth daily as needed (for digestive health).     Ernest Mallick FLEXTOUCH 100 UNIT/ML Pen Inject 15 Units into the skin 2 (two) times daily.     . Multiple Vitamin (MULTIVITAMIN WITH MINERALS) TABS tablet Take 1 tablet by mouth every other day. Advanced Senior Formula    . rosuvastatin (CRESTOR) 40 MG tablet Take 1 tablet (40 mg total) by mouth daily. 90 tablet 1   No current facility-administered medications for this visit.     REVIEW OF SYSTEMS:  [X]  denotes positive finding, [ ]  denotes negative finding Vascular    Leg swelling    Cardiac    Chest pain or chest pressure:     Shortness of breath upon exertion:    Short of breath when lying flat:    Irregular heart rhythm:    Constitutional    Fever or chills:     PHYSICAL EXAM:   Vitals:   05/07/18 1141 05/07/18 1145  BP: 106/69 121/78  Pulse: 63 64  Resp: 16   Temp: (!) 96.6 F (35.9 C)   TempSrc: Oral   SpO2: 95% 96%  Weight: 240 lb (108.9 kg)   Height: 6\' 1"  (1.854 m)     GENERAL: The patient is a well-nourished male, in no acute distress. The vital signs are documented above. CARDIOVASCULAR: There is a regular rate and rhythm. PULMONARY: There is good air exchange bilaterally without wheezing or rales. His right neck incision looks fine. He has no focal weakness or paresthesias.  DATA:   No new data.  MEDICAL ISSUES:   STATUS POST RIGHT CAROTID ENDARTERECTOMY: Patient is doing well status post right carotid endarterectomy.  He is asymptomatic.  He had no significant stenosis on the left side.  I have ordered a follow-up carotid duplex scan in 6 months and I will see him back at that time.  He knows to call sooner if he has problems.  He will remain on his aspirin and statin.  [name] [facility] [phone]

## 2018-05-30 DIAGNOSIS — J439 Emphysema, unspecified: Secondary | ICD-10-CM | POA: Diagnosis not present

## 2018-05-30 DIAGNOSIS — Z0001 Encounter for general adult medical examination with abnormal findings: Secondary | ICD-10-CM | POA: Diagnosis not present

## 2018-05-30 DIAGNOSIS — Z125 Encounter for screening for malignant neoplasm of prostate: Secondary | ICD-10-CM | POA: Diagnosis not present

## 2018-05-30 DIAGNOSIS — Z1211 Encounter for screening for malignant neoplasm of colon: Secondary | ICD-10-CM | POA: Diagnosis not present

## 2018-05-30 DIAGNOSIS — N183 Chronic kidney disease, stage 3 (moderate): Secondary | ICD-10-CM | POA: Diagnosis not present

## 2018-05-30 DIAGNOSIS — Z136 Encounter for screening for cardiovascular disorders: Secondary | ICD-10-CM | POA: Diagnosis not present

## 2018-05-30 DIAGNOSIS — I2510 Atherosclerotic heart disease of native coronary artery without angina pectoris: Secondary | ICD-10-CM | POA: Diagnosis not present

## 2018-05-30 DIAGNOSIS — Z23 Encounter for immunization: Secondary | ICD-10-CM | POA: Diagnosis not present

## 2018-05-30 DIAGNOSIS — E7800 Pure hypercholesterolemia, unspecified: Secondary | ICD-10-CM | POA: Diagnosis not present

## 2018-05-30 DIAGNOSIS — Z87891 Personal history of nicotine dependence: Secondary | ICD-10-CM | POA: Diagnosis not present

## 2018-05-30 DIAGNOSIS — Z79899 Other long term (current) drug therapy: Secondary | ICD-10-CM | POA: Diagnosis not present

## 2018-05-30 DIAGNOSIS — E0822 Diabetes mellitus due to underlying condition with diabetic chronic kidney disease: Secondary | ICD-10-CM | POA: Diagnosis not present

## 2018-06-02 ENCOUNTER — Other Ambulatory Visit: Payer: Self-pay | Admitting: Family Medicine

## 2018-06-02 ENCOUNTER — Other Ambulatory Visit: Payer: Self-pay | Admitting: Acute Care

## 2018-06-02 DIAGNOSIS — Z122 Encounter for screening for malignant neoplasm of respiratory organs: Secondary | ICD-10-CM

## 2018-06-02 DIAGNOSIS — Z0001 Encounter for general adult medical examination with abnormal findings: Secondary | ICD-10-CM | POA: Diagnosis not present

## 2018-06-02 DIAGNOSIS — Z87891 Personal history of nicotine dependence: Secondary | ICD-10-CM

## 2018-06-02 DIAGNOSIS — Z136 Encounter for screening for cardiovascular disorders: Secondary | ICD-10-CM

## 2018-06-06 ENCOUNTER — Ambulatory Visit
Admission: RE | Admit: 2018-06-06 | Discharge: 2018-06-06 | Disposition: A | Discharge status: Home / Self Care | Payer: Medicare Other | Source: Ambulatory Visit | Attending: Family Medicine | Admitting: Family Medicine

## 2018-06-06 DIAGNOSIS — Z136 Encounter for screening for cardiovascular disorders: Secondary | ICD-10-CM | POA: Diagnosis not present

## 2018-06-06 DIAGNOSIS — Z87891 Personal history of nicotine dependence: Secondary | ICD-10-CM | POA: Diagnosis not present

## 2018-06-18 ENCOUNTER — Ambulatory Visit (INDEPENDENT_AMBULATORY_CARE_PROVIDER_SITE_OTHER)
Admission: RE | Admit: 2018-06-18 | Discharge: 2018-06-18 | Disposition: A | Discharge status: Home / Self Care | Payer: Medicare Other | Source: Ambulatory Visit | Attending: Acute Care | Admitting: Acute Care

## 2018-06-18 ENCOUNTER — Ambulatory Visit (INDEPENDENT_AMBULATORY_CARE_PROVIDER_SITE_OTHER): Payer: Medicare Other | Admitting: Acute Care

## 2018-06-18 ENCOUNTER — Encounter: Payer: Self-pay | Admitting: Acute Care

## 2018-06-18 VITALS — BP 130/80 | HR 64 | Ht 74.0 in | Wt 239.4 lb

## 2018-06-18 DIAGNOSIS — Z87891 Personal history of nicotine dependence: Secondary | ICD-10-CM

## 2018-06-18 DIAGNOSIS — Z122 Encounter for screening for malignant neoplasm of respiratory organs: Secondary | ICD-10-CM

## 2018-06-18 NOTE — Progress Notes (Signed)
Shared Decision Making Visit Lung Cancer Screening Program [phone])   Eligibility:  Age 72 y.o.  Pack Years Smoking History Calculation 110 pack year smoking history (# packs/per year x # years smoked)  Recent History of coughing up blood  no  Unexplained weight loss? no ( >Than 15 pounds within the last 6 months )  Prior History Lung / other cancer no (Diagnosis within the last 5 years already requiring surveillance chest CT Scans).  Smoking Status Former Smoker  Former Smokers: Years since quit: < 1 year  Quit Date: 09/2016  Visit Components:  Discussion included one or more decision making aids. Yes  Discussion included risk/benefits of screening. yes  Discussion included potential follow up diagnostic testing for abnormal scans. yes  Discussion included meaning and risk of over diagnosis. yes  Discussion included meaning and risk of False Positives. yes  Discussion included meaning of total radiation exposure. yes  Counseling Included:  Importance of adherence to annual lung cancer LDCT screening. yes  Impact of comorbidities on ability to participate in the program. yes  Ability and willingness to under diagnostic treatment. yes  Smoking Cessation Counseling:  Current Smokers:   Discussed importance of smoking cessation. No, NA  Information about tobacco cessation classes and interventions provided to patient. yes  Patient provided with "ticket" for LDCT Scan. yes  Symptomatic Patient. no  Counseling  Diagnosis Code: Tobacco Use Z72.0  Asymptomatic Patient yes  Counseling (Intermediate counseling: > three minutes counseling) J5701  Former Smokers:   Discussed the importance of maintaining cigarette abstinence. yes  Diagnosis Code: Personal History of Nicotine Dependence. X79.390  Information about tobacco cessation classes and interventions provided to patient. Yes  Patient provided with "ticket" for LDCT Scan. yes  Written Order for Lung  Cancer Screening with LDCT placed in Epic. Yes (CT Chest Lung Cancer Screening Low Dose W/O CM) [id number] Z12.2-Screening of respiratory organs Z87.891-Personal history of nicotine dependence  I spent 25 minutes of face to face time with Mr. Kimball discussing the risks and benefits of lung cancer screening. We viewed a power point together that explained in detail the above noted topics. We took the time to pause the power point at intervals to allow for questions to be asked and answered to ensure understanding. We discussed that he had taken the single most powerful action possible to decrease his risk of developing lung cancer when he quit smoking. I counseled him to remain smoke free, and to contact me if he ever had the desire to smoke again so that I can provide resources and tools to help support the effort to remain smoke free. We discussed the time and location of the scan, and that either  Doroteo Glassman RN or I will call with the results within  24-48 hours of receiving them. He has my card and contact information in the event he needs to speak with me, in addition to a copy of the power point we reviewed as a resource. He verbalized understanding of all of the above and had no further questions upon leaving the office.     I explained to the patient that there has been a high incidence of coronary artery disease noted on these exams. I explained that this is a non-gated exam therefore degree or severity cannot be determined. This patient is currently on statin therapy. I have asked the patient to follow-up with their PCP regarding any incidental finding of coronary artery disease and management with diet or medication as they feel  is clinically indicated. The patient verbalized understanding of the above and had no further questions.     Magdalen Spatz, NP 06/18/2018 11:27 AM

## 2018-06-26 DIAGNOSIS — N184 Chronic kidney disease, stage 4 (severe): Secondary | ICD-10-CM | POA: Diagnosis not present

## 2018-07-01 ENCOUNTER — Other Ambulatory Visit: Payer: Self-pay | Admitting: Acute Care

## 2018-07-01 DIAGNOSIS — Z87891 Personal history of nicotine dependence: Secondary | ICD-10-CM

## 2018-07-01 DIAGNOSIS — Z122 Encounter for screening for malignant neoplasm of respiratory organs: Secondary | ICD-10-CM

## 2018-07-11 DIAGNOSIS — E559 Vitamin D deficiency, unspecified: Secondary | ICD-10-CM | POA: Diagnosis not present

## 2018-07-11 DIAGNOSIS — I129 Hypertensive chronic kidney disease with stage 1 through stage 4 chronic kidney disease, or unspecified chronic kidney disease: Secondary | ICD-10-CM | POA: Diagnosis not present

## 2018-07-11 DIAGNOSIS — N184 Chronic kidney disease, stage 4 (severe): Secondary | ICD-10-CM | POA: Diagnosis not present

## 2018-10-02 ENCOUNTER — Other Ambulatory Visit: Payer: Self-pay | Admitting: *Deleted

## 2018-10-02 MED ORDER — BISOPROLOL FUMARATE 5 MG PO TABS: 5.0000 mg | ORAL_TABLET | Freq: Every morning | ORAL | 0 refills | Status: DC

## 2018-11-04 ENCOUNTER — Other Ambulatory Visit: Payer: Self-pay

## 2018-11-04 MED ORDER — BISOPROLOL FUMARATE 5 MG PO TABS: 5.0000 mg | ORAL_TABLET | Freq: Every morning | ORAL | 0 refills | Status: DC

## 2018-11-10 ENCOUNTER — Other Ambulatory Visit: Payer: Self-pay

## 2018-11-10 MED ORDER — BISOPROLOL FUMARATE 5 MG PO TABS: 5.0000 mg | ORAL_TABLET | Freq: Every morning | ORAL | 1 refills | Status: DC

## 2018-11-28 DIAGNOSIS — J439 Emphysema, unspecified: Secondary | ICD-10-CM | POA: Diagnosis not present

## 2018-11-28 DIAGNOSIS — E1122 Type 2 diabetes mellitus with diabetic chronic kidney disease: Secondary | ICD-10-CM | POA: Diagnosis not present

## 2018-11-28 DIAGNOSIS — E782 Mixed hyperlipidemia: Secondary | ICD-10-CM | POA: Diagnosis not present

## 2018-11-28 DIAGNOSIS — Z794 Long term (current) use of insulin: Secondary | ICD-10-CM | POA: Diagnosis not present

## 2018-11-28 DIAGNOSIS — I2510 Atherosclerotic heart disease of native coronary artery without angina pectoris: Secondary | ICD-10-CM | POA: Diagnosis not present

## 2018-11-28 DIAGNOSIS — N183 Chronic kidney disease, stage 3 (moderate): Secondary | ICD-10-CM | POA: Diagnosis not present

## 2018-12-30 DIAGNOSIS — E1122 Type 2 diabetes mellitus with diabetic chronic kidney disease: Secondary | ICD-10-CM | POA: Diagnosis not present

## 2018-12-30 DIAGNOSIS — E782 Mixed hyperlipidemia: Secondary | ICD-10-CM | POA: Diagnosis not present

## 2018-12-30 DIAGNOSIS — N183 Chronic kidney disease, stage 3 (moderate): Secondary | ICD-10-CM | POA: Diagnosis not present

## 2019-01-12 ENCOUNTER — Telehealth: Payer: Self-pay | Admitting: *Deleted

## 2019-01-12 NOTE — Telephone Encounter (Signed)

## 2019-01-13 ENCOUNTER — Other Ambulatory Visit: Payer: Self-pay

## 2019-01-13 ENCOUNTER — Ambulatory Visit (INDEPENDENT_AMBULATORY_CARE_PROVIDER_SITE_OTHER)
Admission: RE | Admit: 2019-01-13 | Discharge: 2019-01-13 | Disposition: A | Discharge status: Home / Self Care | Payer: Medicare Other | Source: Ambulatory Visit | Attending: Acute Care | Admitting: Acute Care

## 2019-01-13 DIAGNOSIS — Z87891 Personal history of nicotine dependence: Secondary | ICD-10-CM

## 2019-01-13 DIAGNOSIS — R918 Other nonspecific abnormal finding of lung field: Secondary | ICD-10-CM

## 2019-01-13 DIAGNOSIS — Z122 Encounter for screening for malignant neoplasm of respiratory organs: Secondary | ICD-10-CM

## 2019-01-13 DIAGNOSIS — Z0389 Encounter for observation for other suspected diseases and conditions ruled out: Secondary | ICD-10-CM | POA: Diagnosis not present

## 2019-01-19 DIAGNOSIS — N184 Chronic kidney disease, stage 4 (severe): Secondary | ICD-10-CM | POA: Diagnosis not present

## 2019-01-26 ENCOUNTER — Other Ambulatory Visit: Payer: Self-pay | Admitting: *Deleted

## 2019-01-26 DIAGNOSIS — Z122 Encounter for screening for malignant neoplasm of respiratory organs: Secondary | ICD-10-CM

## 2019-01-26 DIAGNOSIS — Z87891 Personal history of nicotine dependence: Secondary | ICD-10-CM

## 2019-01-29 DIAGNOSIS — I2510 Atherosclerotic heart disease of native coronary artery without angina pectoris: Secondary | ICD-10-CM | POA: Diagnosis not present

## 2019-01-29 DIAGNOSIS — E559 Vitamin D deficiency, unspecified: Secondary | ICD-10-CM | POA: Diagnosis not present

## 2019-01-29 DIAGNOSIS — I129 Hypertensive chronic kidney disease with stage 1 through stage 4 chronic kidney disease, or unspecified chronic kidney disease: Secondary | ICD-10-CM | POA: Diagnosis not present

## 2019-01-29 DIAGNOSIS — N184 Chronic kidney disease, stage 4 (severe): Secondary | ICD-10-CM | POA: Diagnosis not present

## 2019-03-24 ENCOUNTER — Other Ambulatory Visit: Payer: Self-pay

## 2019-04-14 DIAGNOSIS — Z23 Encounter for immunization: Secondary | ICD-10-CM | POA: Diagnosis not present

## 2019-04-15 ENCOUNTER — Telehealth: Payer: Self-pay | Admitting: Cardiology

## 2019-04-15 MED ORDER — ROSUVASTATIN CALCIUM 40 MG PO TABS: 40.0000 mg | ORAL_TABLET | Freq: Every day | ORAL | 0 refills | Status: DC

## 2019-04-15 NOTE — Telephone Encounter (Signed)
New Message     *STAT* If patient is at the pharmacy, call can be transferred to refill team.   1. Which medications need to be refilled? (please list name of each medication and dose if known) Rosuvastatin 40mg   2. Which pharmacy/location (including street and city if local pharmacy) is medication to be sent to? [facility] [phone]  3. Do they need a 30 day or 90 day supply? 90 day supply

## 2019-04-15 NOTE — Telephone Encounter (Signed)
Medication sent to pharmacy- advising that patient is overdue for appointment, advised to call office.

## 2019-05-12 ENCOUNTER — Telehealth (HOSPITAL_COMMUNITY): Payer: Self-pay

## 2019-05-12 ENCOUNTER — Other Ambulatory Visit: Payer: Self-pay

## 2019-05-12 DIAGNOSIS — I6523 Occlusion and stenosis of bilateral carotid arteries: Secondary | ICD-10-CM

## 2019-05-12 NOTE — Telephone Encounter (Signed)
The above patient or their representative was contacted and gave the following answers to these questions:         Do you have any of the following symptoms? No  Fever                    Cough                   Shortness of breath  Do  you have any of the following other symptoms?    muscle pain         vomiting,        diarrhea        rash         weakness        red eye        abdominal pain         bruising          bruising or bleeding              joint pain           severe headache    Have you been in contact with someone who was or has been sick in the past 2 weeks? No  Yes                 Unsure                         Unable to assess   Does the person that you were in contact with have any of the following symptoms?   Cough         shortness of breath           muscle pain         vomiting,            diarrhea            rash            weakness           fever            red eye           abdominal pain           bruising  or  bleeding                joint pain                severe headache

## 2019-05-13 ENCOUNTER — Encounter: Payer: Self-pay | Admitting: Vascular Surgery

## 2019-05-13 ENCOUNTER — Ambulatory Visit (HOSPITAL_COMMUNITY)
Admission: RE | Admit: 2019-05-13 | Discharge: 2019-05-13 | Disposition: A | Discharge status: Home / Self Care | Payer: Medicare Other | Source: Ambulatory Visit | Attending: Vascular Surgery | Admitting: Vascular Surgery

## 2019-05-13 ENCOUNTER — Ambulatory Visit (INDEPENDENT_AMBULATORY_CARE_PROVIDER_SITE_OTHER): Payer: Medicare Other | Admitting: Vascular Surgery

## 2019-05-13 ENCOUNTER — Other Ambulatory Visit: Payer: Self-pay

## 2019-05-13 VITALS — BP 134/75 | HR 58 | Temp 97.7°F | Resp 20 | Ht 74.0 in | Wt 252.3 lb

## 2019-05-13 DIAGNOSIS — I6523 Occlusion and stenosis of bilateral carotid arteries: Secondary | ICD-10-CM

## 2019-05-13 NOTE — Progress Notes (Signed)
Patient name: Paul Mcneil MRN: [id number] DOB: Sep 02, 1945 Sex: male  REASON FOR VISIT:   Follow-up of bilateral carotid disease  HPI:   Paul Mcneil is a pleasant 73 y.o. male who I last saw on 05/07/2018.  He had a left carotid endarterectomy along with a CABG in July 2018.  Most recently in August 2019 he had a right carotid endarterectomy.  He was being followed with a moderate right carotid stenosis which progressed to greater than 80% and he also developed symptoms consistent with amaurosis fugax in the right eye.  When I saw him last he was doing well and was asymptomatic.  He was set up for a 84-month follow-up study.  Since I saw him last, he denies any history of stroke, TIAs, expressive or receptive aphasia, or amaurosis fugax.  He has had no significant chest pain and denies significant dyspnea on exertion.  He is on aspirin and is on a statin.  He quit tobacco in 2018.  Past Medical History:  Diagnosis Date  . Anemia 1964  . Arthritis    "touch starting to show up in the joints" (12/11/2013)  . CAD (coronary artery disease), native coronary artery    a. Cath 01/30/17 showing multivessel CAD --> underwent CABG on 02/15/2017 with LIMA-LAD, SVG-PDA, and SVG-Ramus-OM1.   . Carotid artery calcification    a. 01/2017: s/p L CEA performed at time of CABG; Existing Mod-Severe R Carotid stenosis  . Chronic kidney disease    "my kidneys are down to 30% function"  . COPD (chronic obstructive pulmonary disease) ([place])    "they said I had it"  . Diabetes ([place])   . Dyspnea    with exertion  . Former heavy tobacco smoker   . GERD (gastroesophageal reflux disease)    Dr. Michail Sermon  . History of alcohol abuse   . Hyperlipemia     Family History  Problem Relation Age of Onset  . Rheum arthritis Mother   . Cancer Father        BONE MARROW    SOCIAL HISTORY: Social History   Tobacco Use  . Smoking status: Former Smoker    Packs/day: 2.00    Years: 55.00    Pack  years: 110.00    Types: Cigarettes    Quit date: 09/21/2016    Years since quitting: 2.6  . Smokeless tobacco: Never Used  Substance Use Topics  . Alcohol use: No    Alcohol/week: 20.0 standard drinks    Types: 20 Glasses of wine per week    Comment: quit 1 year ago- in 2018    Allergies  Allergen Reactions  . Ceclor [Cefaclor] Anaphylaxis, Hives and Other (See Comments)    BASED ON CRITERIA FOR ANAPHYLAXIS HIVES + HYPOTENSION    Current Outpatient Medications  Medication Sig Dispense Refill  . aspirin 81 MG tablet Take 1 tablet (81 mg total) by mouth daily. (Patient taking differently: Take 162 mg by mouth every morning. ) 30 tablet 6  . bisoprolol (ZEBETA) 5 MG tablet Take 1 tablet (5 mg total) by mouth every morning. 90 tablet 1  . cholecalciferol (VITAMIN D3) 25 MCG (1000 UT) tablet Take 1,000 Units by mouth daily.    Marland Kitchen LEVEMIR FLEXTOUCH 100 UNIT/ML Pen Inject 15 Units into the skin 2 (two) times daily.     . rosuvastatin (CRESTOR) 40 MG tablet Take 1 tablet (40 mg total) by mouth daily. 90 tablet 0  . esomeprazole (NEXIUM) 40 MG capsule Take 1  capsule (40 mg total) by mouth daily as needed (for acid reflux). (Patient not taking: Reported on 05/13/2019) 90 capsule 3  . Lactobacillus Casei-Folic Acid (RESTORA RX) 60-1.25 MG CAPS Take 1 tablet by mouth daily as needed (for digestive health).     . Multiple Vitamin (MULTIVITAMIN WITH MINERALS) TABS tablet Take 1 tablet by mouth every other day. Advanced Senior Formula     No current facility-administered medications for this visit.     REVIEW OF SYSTEMS:  [X]  denotes positive finding, [ ]  denotes negative finding Cardiac  Comments:  Chest pain or chest pressure:    Shortness of breath upon exertion:    Short of breath when lying flat:    Irregular heart rhythm:        Vascular    Pain in calf, thigh, or hip brought on by ambulation:    Pain in feet at night that wakes you up from your sleep:     Blood clot in your veins:     Leg swelling:  x       Pulmonary    Oxygen at home:    Productive cough:     Wheezing:         Neurologic    Sudden weakness in arms or legs:     Sudden numbness in arms or legs:     Sudden onset of difficulty speaking or slurred speech:    Temporary loss of vision in one eye:     Problems with dizziness:         Gastrointestinal    Blood in stool:     Vomited blood:         Genitourinary    Burning when urinating:     Blood in urine:        Psychiatric    Major depression:         Hematologic    Bleeding problems:    Problems with blood clotting too easily:        Skin    Rashes or ulcers:        Constitutional    Fever or chills:     PHYSICAL EXAM:   Vitals:   05/13/19 1402 05/13/19 1405  BP: (!) 142/61 134/75  Pulse: (!) 58   Resp: 20   Temp: 97.7 F (36.5 C)   SpO2: 96%   Weight: 252 lb 4.8 oz (114.4 kg)   Height: 6\' 2"  (1.88 m)     GENERAL: The patient is a well-nourished male, in no acute distress. The vital signs are documented above. CARDIAC: There is a regular rate and rhythm.  VASCULAR: I do not detect carotid bruits. PULMONARY: There is good air exchange bilaterally without wheezing or rales. ABDOMEN: Soft and non-tender with normal pitched bowel sounds.  MUSCULOSKELETAL: There are no major deformities or cyanosis. NEUROLOGIC: No focal weakness or paresthesias are detected. SKIN: There are no ulcers or rashes noted. PSYCHIATRIC: The patient has a normal affect.  DATA:    CAROTID DUPLEX: I have independently interpreted his carotid duplex scan today.  On the right side he has a less than 39% stenosis.  Right carotid endarterectomy site is widely patent.  There is no evidence of recurrent stenosis.  The right vertebral artery is patent with antegrade flow.  On the left side there is a less than 39% stenosis.  The left vertebral artery is patent with antegrade flow.  MEDICAL ISSUES:   STATUS POST BILATERAL CAROTID ENDARTERECTOMIES: The  patient is doing  well status post bilateral carotid endarterectomies.  Both carotid endarterectomy sites are widely patent.  He is on aspirin and is on a statin.  Is not a smoker.  I encouraged him to stay as active as possible.  He also is careful about what he eats now.  He quit smoking in 2018.  I have ordered a follow-up carotid duplex scan in 1 year and I will see him back at that time.  He knows to call sooner if he has problems.  [name] [facility] [phone]

## 2019-05-14 ENCOUNTER — Other Ambulatory Visit: Payer: Self-pay

## 2019-05-14 DIAGNOSIS — I6523 Occlusion and stenosis of bilateral carotid arteries: Secondary | ICD-10-CM

## 2019-05-31 DIAGNOSIS — L02212 Cutaneous abscess of back [any part, except buttock]: Secondary | ICD-10-CM | POA: Diagnosis not present

## 2019-06-17 DIAGNOSIS — E7800 Pure hypercholesterolemia, unspecified: Secondary | ICD-10-CM | POA: Diagnosis not present

## 2019-06-17 DIAGNOSIS — E1122 Type 2 diabetes mellitus with diabetic chronic kidney disease: Secondary | ICD-10-CM | POA: Diagnosis not present

## 2019-06-17 DIAGNOSIS — J439 Emphysema, unspecified: Secondary | ICD-10-CM | POA: Diagnosis not present

## 2019-06-17 DIAGNOSIS — N184 Chronic kidney disease, stage 4 (severe): Secondary | ICD-10-CM | POA: Diagnosis not present

## 2019-06-17 DIAGNOSIS — Z1211 Encounter for screening for malignant neoplasm of colon: Secondary | ICD-10-CM | POA: Diagnosis not present

## 2019-06-17 DIAGNOSIS — I2510 Atherosclerotic heart disease of native coronary artery without angina pectoris: Secondary | ICD-10-CM | POA: Diagnosis not present

## 2019-06-17 DIAGNOSIS — Z0001 Encounter for general adult medical examination with abnormal findings: Secondary | ICD-10-CM | POA: Diagnosis not present

## 2019-06-17 DIAGNOSIS — Z794 Long term (current) use of insulin: Secondary | ICD-10-CM | POA: Diagnosis not present

## 2019-06-26 ENCOUNTER — Other Ambulatory Visit: Payer: Self-pay | Admitting: Cardiology

## 2019-06-26 NOTE — Telephone Encounter (Signed)
°*  STAT* If patient is at the pharmacy, call can be transferred to refill team.   1. Which medications need to be refilled? (please list name of each medication and dose if known)   [place] Prescriptions forRosuvastatin and Bisoprolol  2. Which pharmacy/location (including street and city if local pharmacy) is medication to be sent to? [facility] [phone]  3. Do they need a 30 day or 90 day supply? 90 days and refills

## 2019-07-01 ENCOUNTER — Other Ambulatory Visit: Payer: Self-pay | Admitting: Cardiology

## 2019-07-01 MED ORDER — BISOPROLOL FUMARATE 5 MG PO TABS: 5.0000 mg | ORAL_TABLET | Freq: Every morning | ORAL | 2 refills | Status: DC

## 2019-07-01 MED ORDER — ROSUVASTATIN CALCIUM 40 MG PO TABS: 40.0000 mg | ORAL_TABLET | Freq: Every day | ORAL | 2 refills | Status: DC

## 2019-07-01 NOTE — Telephone Encounter (Signed)
Rx(s) sent to pharmacy electronically. Contacted patient and scheduled him a follow up appt with a app in February; patient voiced understanding.

## 2019-07-01 NOTE — Telephone Encounter (Signed)
°*  STAT* If patient is at the pharmacy, call can be transferred to refill team.   1. Which medications need to be refilled? (please list name of each medication and dose if known)  New prescriptions for Bisoprolol and Rosuvaastatin  2. Which pharmacy/location (including street and city if local pharmacy) is medication to be sent to? [place] [phone]- [phone]- phone number   3. Do they need a 30 day or 90 day supply? 90 days and refills

## 2019-09-15 ENCOUNTER — Telehealth: Payer: Medicare Other | Admitting: Physician Assistant

## 2019-09-17 ENCOUNTER — Telehealth (INDEPENDENT_AMBULATORY_CARE_PROVIDER_SITE_OTHER): Payer: Commercial Managed Care - PPO | Admitting: Physician Assistant

## 2019-09-17 ENCOUNTER — Encounter: Payer: Self-pay | Admitting: Physician Assistant

## 2019-09-17 ENCOUNTER — Other Ambulatory Visit: Payer: Self-pay | Admitting: Physician Assistant

## 2019-09-17 VITALS — BP 146/77 | HR 64 | Temp 97.3°F | Ht 73.0 in | Wt 245.0 lb

## 2019-09-17 DIAGNOSIS — Z951 Presence of aortocoronary bypass graft: Secondary | ICD-10-CM

## 2019-09-17 DIAGNOSIS — I129 Hypertensive chronic kidney disease with stage 1 through stage 4 chronic kidney disease, or unspecified chronic kidney disease: Secondary | ICD-10-CM

## 2019-09-17 DIAGNOSIS — I7789 Other specified disorders of arteries and arterioles: Secondary | ICD-10-CM | POA: Diagnosis not present

## 2019-09-17 DIAGNOSIS — E119 Type 2 diabetes mellitus without complications: Secondary | ICD-10-CM

## 2019-09-17 DIAGNOSIS — I6523 Occlusion and stenosis of bilateral carotid arteries: Secondary | ICD-10-CM

## 2019-09-17 DIAGNOSIS — I10 Essential (primary) hypertension: Secondary | ICD-10-CM

## 2019-09-17 DIAGNOSIS — E1122 Type 2 diabetes mellitus with diabetic chronic kidney disease: Secondary | ICD-10-CM

## 2019-09-17 DIAGNOSIS — I25810 Atherosclerosis of coronary artery bypass graft(s) without angina pectoris: Secondary | ICD-10-CM | POA: Diagnosis not present

## 2019-09-17 DIAGNOSIS — E785 Hyperlipidemia, unspecified: Secondary | ICD-10-CM

## 2019-09-17 DIAGNOSIS — N183 Chronic kidney disease, stage 3 unspecified: Secondary | ICD-10-CM

## 2019-09-17 DIAGNOSIS — J449 Chronic obstructive pulmonary disease, unspecified: Secondary | ICD-10-CM

## 2019-09-17 MED ORDER — OMEGA-3-ACID ETHYL ESTERS 1 G PO CAPS: 1.0000 g | ORAL_CAPSULE | Freq: Two times a day (BID) | ORAL | 3 refills | Status: DC

## 2019-09-17 MED ORDER — BISOPROLOL FUMARATE 5 MG PO TABS: 7.5000 mg | ORAL_TABLET | Freq: Every morning | ORAL | 3 refills | Status: DC

## 2019-09-17 NOTE — Progress Notes (Signed)
Virtual Visit via Telephone Note   This visit type was conducted due to national recommendations for restrictions regarding the COVID-19 Pandemic (e.g. social distancing) in an effort to limit this patient's exposure and mitigate transmission in our community.  Due to his co-morbid illnesses, this patient is at least at moderate risk for complications without adequate follow up.  This format is felt to be most appropriate for this patient at this time.  The patient did not have access to video technology/had technical difficulties with video requiring transitioning to audio format only (telephone).  All issues noted in this document were discussed and addressed.  No physical exam could be performed with this format.  Please refer to the patient's chart for his  consent to telehealth for [facility].   Date:  09/19/2019   ID:  Paul Mcneil, DOB [birth year], MRN [id number]  Patient Location: Home Provider Location: Office  PCP:  [name], MD  Cardiologist:  Glenetta Hew, MD  Electrophysiologist:  None   Evaluation Performed:  Follow-Up Visit  Chief Complaint:  followup  History of Present Illness:    Paul Mcneil is a 74 y.o. male with PMH of CAD s/p CABG, HLD, DM 2, carotid artery disease, COPD, former tobacco use and CKD.  Patient was former Engineer, structural for [employer] and moved down to [place] in 2009.  Myoview performed for atypical chest pain in June 2018 was abnormal.  This led to cardiac catheterization and eventual bypass surgery on 02/15/2017 with LIMA-LAD, SVG-PDA, SVG-ramus and OM1.  He underwent left carotid enterectomy at the same time.  Postop course was complicated by superficial wound infection treated with debridement and antibiotic.  He continued to have a moderate right ICA disease followed by Dr. Scot Dock.  His last follow-up with Dr. [name] was on 09/06/2017 at which time he was doing well.  Since last office visit, patient underwent a right CEA by  Dr. Scot Dock in August 2019.  Most recent carotid Doppler obtained on 05/13/2019 showed less than 40% bilateral ICA stenosis, greater than 50% external carotid artery disease noted on the right side.  Normal flow noted in bilateral subclavian arteries.  Patient presents today for virtual visit.  He denies any chest pain or exertional shortness of breath.  He has no new issues.  Blood pressure has been mildly elevated recently in the 140s all above, I plan to increase his bisoprolol to 7.5 mg daily.  His baseline heart rate is in the high 50s and low 60s range.  He is aware that if his heart rate is less than 50 bpm after it started on the higher dose of bisoprolol he will need to let us know.  Otherwise based on [facility], his last lipid panel obtained by PCP was in December 2020, total cholesterol 131, HDL 25, LDL 44, triglyceride 417.  A1c 6.6.  I recommended addition of Lovaza 1000 mg twice daily to his Crestor.  He says he does have a kidney doctor however he does not remember the name.  He can follow-up with Dr. [name] in 9 to 12 months, earlier if needed.  The patient does not have symptoms concerning for COVID-19 infection (fever, chills, cough, or new shortness of breath).    Past Medical History:  Diagnosis Date  . Anemia 1964  . Arthritis    "touch starting to show up in the joints" (12/11/2013)  . CAD (coronary artery disease), native coronary artery    a. Cath 01/30/17 showing multivessel CAD -->  underwent CABG on 02/15/2017 with LIMA-LAD, SVG-PDA, and SVG-Ramus-OM1.   . Carotid artery calcification    a. 01/2017: s/p L CEA performed at time of CABG; Existing Mod-Severe R Carotid stenosis  . Chronic kidney disease    "my kidneys are down to 30% function"  . COPD (chronic obstructive pulmonary disease) ([place])    "they said I had it"  . Diabetes ([place])   . Dyspnea    with exertion  . Former heavy tobacco smoker   . GERD (gastroesophageal reflux disease)    Dr. Michail Sermon  . History of alcohol  abuse   . Hyperlipemia    Past Surgical History:  Procedure Laterality Date  . APPENDECTOMY  12/11/2013   "laparoscopic"  . BRONCHOSCOPY    . CHOLECYSTECTOMY  ~ 2010  . CORONARY ARTERY BYPASS GRAFT N/A 02/15/2017   Procedure: CORONARY ARTERY BYPASS GRAFTING (CABG) x4 :LAD to  LIMA, SVG to PDA , Sequential SVG to OM1 and Ramus;  Surgeon: Melrose Nakayama, MD;  Location: [facility];  Service: Open Heart Surgery;  Laterality: N/A;  . ENDARTERECTOMY Left 02/15/2017   Procedure: LEFT ENDARTERECTOMY CAROTID;  Surgeon: Angelia Mould, MD;  Location: [place];  Service: Vascular;  Laterality: Left;  . ENDARTERECTOMY Right 03/04/2018   Procedure: ENDARTERECTOMY CAROTID RIGHT WITH PRIMARY CLOSURE OF ARTERY;  Surgeon: Angelia Mould, MD;  Location: [place];  Service: Vascular;  Laterality: Right;  . LAPAROSCOPIC APPENDECTOMY N/A 12/11/2013   Procedure: APPENDECTOMY LAPAROSCOPIC;  Surgeon: Imogene Burn. Georgette Dover, MD;  Location: [place];  Service: General;  Laterality: N/A;  . RIGHT/LEFT HEART CATH AND CORONARY ANGIOGRAPHY N/A 01/30/2017   Procedure: Right/Left Heart Cath and Coronary Angiography;  Surgeon: Troy Sine, MD;  Location: [place] [facility];  Service: Cardiovascular: Normal RHC pressures.  Significant multivessel CAD with 50-60% smooth d-LM, 40% mid LAD. 50% mid LAD stenoses; RI --70% ostial & 80% prox RI; 90% pCx w/  80% and 70% distal; 80% oxt-prox & 90% prox-mid -> 100% CTO distal mid RCA w/ L-R collaterals  . TEE WITHOUT CARDIOVERSION N/A 02/15/2017   Procedure: TRANSESOPHAGEAL ECHOCARDIOGRAM (TEE);  Surgeon: Melrose Nakayama, MD;  [facility];  Service: Open Heart Surgery: Vigorous wall motion.  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  Mild MR noted.  . TONSILLECTOMY AND ADENOIDECTOMY  1950's  . 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  Otherwise normal cavity size.   Mild LVH.  EF 60-65%.  GR 1 DD.  Mild MR.     No outpatient medications have been marked as taking for the 09/17/19 encounter (Telemedicine) with Almyra Deforest, PA.     Allergies:   Ceclor [cefaclor]   Social History   Tobacco Use  . Smoking status: Former Smoker    Packs/day: 2.00    Years: 55.00    Pack years: 110.00    Types: Cigarettes    Quit date: 09/21/2016    Years since quitting: 2.9  . Smokeless tobacco: Never Used  Substance Use Topics  . Alcohol use: No    Alcohol/week: 20.0 standard drinks    Types: 20 Glasses of wine per week    Comment: quit 1 year ago- in 2018  . Drug use: No     Family Hx: The patient's family history includes Cancer in his father; Rheum arthritis in his mother.  ROS:   Please see the history of present illness.  All other systems reviewed and are negative.   Prior CV studies:   The following studies were reviewed today:  Cath 01/30/2017  Ost RCA lesion, 80 %stenosed.  Mid RCA-2 lesion, 100 %stenosed.  Mid RCA-1 lesion, 90 %stenosed.  Ost LM lesion, 60 %stenosed.  Ost Ramus lesion, 70 %stenosed.  Ramus lesion, 80 %stenosed.  Prox Cx lesion, 90 %stenosed.  Mid Cx lesion, 80 %stenosed.  3rd Mrg lesion, 70 %stenosed.  Prox LAD to Mid LAD lesion, 40 %stenosed.  Mid LAD lesion, 40 %stenosed.   Normal right heart pressures.  Significant multivessel CAD with  50-60% smooth distal left main stenosis with the percent proximal mid LAD stenoses; 70% ostial and 80% proximal ramus intermediate stenoses; 90% eccentric proximal circumflex stenoses with 80 and 70% distal stenoses and 80% proximal, 90% proximal to mid and total occlusion of the mid RCA with left-to-right collaterals.  LVEDP 19 mmHg.  Left ventriculography was not done due to the patient's renal insufficiency.  RECOMMENDATION: The patient will be hydrated overnight.  He will be started on IV heparin.  Surgical consultation will be obtained for CABG revascularization  surgery.  Angiographic findings were reviewed with Dr. Glenetta Hew.  Labs/Other Tests and Data Reviewed:    EKG:  An ECG dated 03/11/2017 was personally reviewed today and demonstrated:  Normal sinus rhythm with Q waves in the inferior lead and a T wave inversion in the lateral leads.  Recent Labs: No results found for requested labs within last 8760 hours.   Recent Lipid Panel Lab Results  Component Value Date/Time   CHOL 166 11/14/2017 09:53 AM   TRIG 201 (H) 11/14/2017 09:53 AM   HDL 26 (L) 11/14/2017 09:53 AM   CHOLHDL 6.4 (H) 11/14/2017 09:53 AM   LDLCALC 100 (H) 11/14/2017 09:53 AM    Wt Readings from Last 3 Encounters:  09/17/19 245 lb (111.1 kg)  05/13/19 252 lb 4.8 oz (114.4 kg)  06/18/18 239 lb 6.4 oz (108.6 kg)     Objective:    Vital Signs:  BP (!) 146/77   Pulse 64   Temp (!) 97.3 F (36.3 C)   Ht 6\' 1"  (1.854 m)   Wt 245 lb (111.1 kg)   BMI 32.32 kg/m    VITAL SIGNS:  reviewed  ASSESSMENT & PLAN:    1. CAD s/p CABG: Denies any obvious chest pain.  On aspirin and Crestor  2. Carotid artery disease: Followed by Dr. Scot Dock 3. Hypertension: Blood pressure mildly elevated, will increase bisoprolol to 7.5 mg daily  4. Hyperlipidemia: Triglyceride elevated on the last lab work, will add Lovaza to Crestor  5. DM2: Managed by primary care provider  6. COPD: No acute exacerbation  7. CKD stage III: Followed by nephrology service  COVID-19 Education: The signs and symptoms of COVID-19 were discussed with the patient and how to seek care for testing (follow up with PCP or arrange E-visit).  The importance of social distancing was discussed today.  Time:   Today, I have spent 10 minutes with the patient with telehealth technology discussing the above problems.     Medication Adjustments/Labs and Tests Ordered: Current medicines are reviewed at length with the patient today.  Concerns regarding medicines are outlined above.   Tests Ordered: No orders  of the defined types were placed in this encounter.   Medication Changes: Meds ordered this encounter  Medications  . bisoprolol (ZEBETA) 5 MG tablet    Sig: Take 1.5 tablets (7.5 mg total) by mouth  every morning.    Dispense:  135 tablet    Refill:  3    Dose increase  . omega-3 acid ethyl esters (LOVAZA) 1 g capsule    Sig: Take 1 capsule (1 g total) by mouth 2 (two) times daily.    Dispense:  180 capsule    Refill:  3    Follow Up:  Either In Person or Virtual in [address]  Signed, Almyra Deforest, [place]  09/19/2019 11:24 PM    [place]

## 2019-09-17 NOTE — Patient Instructions (Signed)
Medication Instructions:  INCREASE bisoprolol to 7.5 mg (1.5 tablets) daily  START Lovaza 1 g (1 capsule) two times daily  *If you need a refill on your cardiac medications before your next appointment, please call your pharmacy*  Lab Work: NONE  Testing/Procedures: NONE  Follow-Up: At [facility], you and your health needs are our priority.  As part of our continuing mission to provide you with exceptional heart care, we have created designated Provider Care Teams.  These Care Teams include your primary Cardiologist (physician) and Advanced Practice Providers (APPs -  Physician Assistants and Nurse Practitioners) who all work together to provide you with the care you need, when you need it.  Your next appointment:   9-12 month(s)  The format for your next appointment:   In Person  Provider:   Glenetta Hew, MD  Other Instructions Monitor your blood pressure and heart rate at home.   If you notice your heart rate is less than 50, please let us know.

## 2019-11-12 DIAGNOSIS — N184 Chronic kidney disease, stage 4 (severe): Secondary | ICD-10-CM | POA: Diagnosis not present

## 2019-11-18 DIAGNOSIS — N2581 Secondary hyperparathyroidism of renal origin: Secondary | ICD-10-CM | POA: Diagnosis not present

## 2019-11-18 DIAGNOSIS — N184 Chronic kidney disease, stage 4 (severe): Secondary | ICD-10-CM | POA: Diagnosis not present

## 2019-11-18 DIAGNOSIS — I129 Hypertensive chronic kidney disease with stage 1 through stage 4 chronic kidney disease, or unspecified chronic kidney disease: Secondary | ICD-10-CM | POA: Diagnosis not present

## 2020-01-06 ENCOUNTER — Other Ambulatory Visit: Payer: Self-pay

## 2020-01-06 ENCOUNTER — Encounter: Payer: Medicare Other | Attending: Family Medicine | Admitting: Skilled Nursing Facility1

## 2020-01-06 ENCOUNTER — Encounter: Payer: Self-pay | Admitting: Skilled Nursing Facility1

## 2020-01-06 DIAGNOSIS — E1122 Type 2 diabetes mellitus with diabetic chronic kidney disease: Secondary | ICD-10-CM | POA: Diagnosis present

## 2020-01-06 DIAGNOSIS — N183 Chronic kidney disease, stage 3 unspecified: Secondary | ICD-10-CM

## 2020-01-06 DIAGNOSIS — N189 Chronic kidney disease, unspecified: Secondary | ICD-10-CM | POA: Insufficient documentation

## 2020-01-06 NOTE — Progress Notes (Signed)
  Assessment:  Primary concerns today: CKD.   Pt states he got his GFR from 21 to 27. Has made changes in caffeine, sugar, splenda, and dairy and cutting back on salt intake which he feels has been the answer to healthier kidneys.  Pt states he has bought a Stationary bike which he plans on using more often.  Taking multi and b complex every other day and vitamin E and omega 3 Labs from December: A1C 6.6, Potassium  3.5  MEDICATIONS: see list   DIETARY INTAKE:  Usual eating pattern includes 3 meals and 2 snacks per day.  Everyday foods include none stated.  Avoided foods include none stated.    24-hr recall:  B ( AM): berries + tuna fish sandwich Snk ( AM): nuts and dried fruit L ( PM):  Snk ( PM): salt free corn chips D (5 PM): [place] sandwich Snk ( PM): low sodiumfrozen meal Beverages: water + lemon   Usual physical activity: ADL's   Progress Towards Goal(s):  In progress.     Intervention:  Nutrition counseling. Dietitian educated pt on balanced meals within the context of heart disease, DM, and CKD.  Goals: -30-45 grams of complex Carbohydrate per meal -60 grams of protein per day leaning on plant based options limiting animal based proteins to 4 days a week -Sodium 2000 mg or less per day -Aim to bike 30 minutes 5-6 days a week  Teaching Method Utilized:  Visual Auditory Hands on  Handouts given during visit include:  Kidney friendly meal plan  Barriers to learning/adherence to lifestyle change: restrictive nature of kidney freindly diet   Demonstrated degree of understanding via:  Teach Back   Monitoring/Evaluation:  Dietary intake, exercise, and body weight prn.

## 2020-01-14 ENCOUNTER — Ambulatory Visit: Payer: Commercial Managed Care - PPO

## 2020-01-20 ENCOUNTER — Ambulatory Visit
Admission: RE | Admit: 2020-01-20 | Discharge: 2020-01-20 | Disposition: A | Discharge status: Home / Self Care | Payer: Medicare Other | Source: Ambulatory Visit | Attending: Acute Care | Admitting: Acute Care

## 2020-01-20 DIAGNOSIS — Z87891 Personal history of nicotine dependence: Secondary | ICD-10-CM | POA: Diagnosis not present

## 2020-01-20 DIAGNOSIS — Z122 Encounter for screening for malignant neoplasm of respiratory organs: Secondary | ICD-10-CM

## 2020-01-21 ENCOUNTER — Other Ambulatory Visit: Payer: Self-pay | Admitting: *Deleted

## 2020-01-21 DIAGNOSIS — Z87891 Personal history of nicotine dependence: Secondary | ICD-10-CM

## 2020-01-21 NOTE — Progress Notes (Signed)
Please call patient and let them  know their  low dose Ct was read as a Lung RADS 2: nodules that are benign in appearance and behavior with a very low likelihood of becoming a clinically active cancer due to size or lack of growth. Recommendation per radiology is for a repeat LDCT in 12 months. .Please let them  know we will order and schedule their  annual screening scan for 12/2020 Please let them  know there was notation of CAD on their  scan.  Please remind the patient  that this is a non-gated exam therefore degree or severity of disease  cannot be determined. Please have them  follow up with their PCP regarding potential risk factor modification, dietary therapy or pharmacologic therapy if clinically indicated. Pt.  is currently on statin therapy. Please place order for annual  screening scan for  12/2020 and fax results to PCP. Thanks so much.

## 2020-02-18 DIAGNOSIS — N184 Chronic kidney disease, stage 4 (severe): Secondary | ICD-10-CM | POA: Diagnosis not present

## 2020-02-18 DIAGNOSIS — N2581 Secondary hyperparathyroidism of renal origin: Secondary | ICD-10-CM | POA: Diagnosis not present

## 2020-02-18 DIAGNOSIS — E785 Hyperlipidemia, unspecified: Secondary | ICD-10-CM | POA: Diagnosis not present

## 2020-02-18 DIAGNOSIS — I129 Hypertensive chronic kidney disease with stage 1 through stage 4 chronic kidney disease, or unspecified chronic kidney disease: Secondary | ICD-10-CM | POA: Diagnosis not present

## 2020-03-30 ENCOUNTER — Telehealth: Payer: Self-pay | Admitting: Cardiology

## 2020-03-30 NOTE — Telephone Encounter (Signed)
Patient stated he got a letter stating that it is time to schedule his follow up appointment with Dr. [name]. I offered to schedule an appointment. However, patient declined and requested to speak with clinical staff specifically to discuss the type of procedure Dr. [name] performed on him in the past.

## 2020-04-01 ENCOUNTER — Telehealth: Payer: Self-pay | Admitting: Cardiology

## 2020-04-01 NOTE — Telephone Encounter (Signed)
Patient would like to know if he needs to have a Carotid US prior to his appt in November.  He states he has had this done in the past.  If so can an order be placed.

## 2020-04-01 NOTE — Telephone Encounter (Signed)
Spoke with the pt and informed him that Dr. Scot Dock with [facility] has recalled for the pt to see him back for one year, with carotids same time, as indicated in the pts last office visit note with them.  Informed the pt that he is due to see Dr. Scot Dock and have carotids done, for October of this year.  Advised the pt to call Dr. Nicole Cella office today,  for further assistance with getting both appointments made.  Advised the pt to keep his follow-up appt with Dr. [name] as scheduled for November.  Pt verbalized understanding and agrees with this plan.

## 2020-04-26 DIAGNOSIS — K2270 Barrett's esophagus without dysplasia: Secondary | ICD-10-CM | POA: Diagnosis not present

## 2020-04-26 DIAGNOSIS — Z86010 Personal history of colonic polyps: Secondary | ICD-10-CM | POA: Diagnosis not present

## 2020-04-26 DIAGNOSIS — K219 Gastro-esophageal reflux disease without esophagitis: Secondary | ICD-10-CM | POA: Diagnosis not present

## 2020-05-02 DIAGNOSIS — N184 Chronic kidney disease, stage 4 (severe): Secondary | ICD-10-CM | POA: Diagnosis not present

## 2020-05-02 DIAGNOSIS — E7800 Pure hypercholesterolemia, unspecified: Secondary | ICD-10-CM | POA: Diagnosis not present

## 2020-05-02 DIAGNOSIS — E1122 Type 2 diabetes mellitus with diabetic chronic kidney disease: Secondary | ICD-10-CM | POA: Diagnosis not present

## 2020-05-02 DIAGNOSIS — I10 Essential (primary) hypertension: Secondary | ICD-10-CM | POA: Diagnosis not present

## 2020-05-06 DIAGNOSIS — H40053 Ocular hypertension, bilateral: Secondary | ICD-10-CM | POA: Diagnosis not present

## 2020-05-06 DIAGNOSIS — E119 Type 2 diabetes mellitus without complications: Secondary | ICD-10-CM | POA: Diagnosis not present

## 2020-05-20 ENCOUNTER — Other Ambulatory Visit: Payer: Self-pay | Admitting: Cardiology

## 2020-05-20 MED ORDER — ROSUVASTATIN CALCIUM 40 MG PO TABS: 40.0000 mg | ORAL_TABLET | Freq: Every day | ORAL | 2 refills | Status: DC

## 2020-05-20 NOTE — Telephone Encounter (Signed)
°*  STAT* If patient is at the pharmacy, call can be transferred to refill team.   1. Which medications need to be refilled? (please list name of each medication and dose if known) rosuvastatin (CRESTOR) 40 MG tablet  2. Which pharmacy/location (including street and city if local pharmacy) is medication to be sent to? [place]  3. Do they need a 30 day or 90 day supply? 90 day supply

## 2020-05-26 DIAGNOSIS — N184 Chronic kidney disease, stage 4 (severe): Secondary | ICD-10-CM | POA: Diagnosis not present

## 2020-06-01 ENCOUNTER — Ambulatory Visit (HOSPITAL_COMMUNITY)
Admission: RE | Admit: 2020-06-01 | Discharge: 2020-06-01 | Disposition: A | Discharge status: Home / Self Care | Payer: Medicare Other | Source: Ambulatory Visit | Attending: Vascular Surgery | Admitting: Vascular Surgery

## 2020-06-01 ENCOUNTER — Encounter: Payer: Self-pay | Admitting: Vascular Surgery

## 2020-06-01 ENCOUNTER — Other Ambulatory Visit: Payer: Self-pay

## 2020-06-01 ENCOUNTER — Ambulatory Visit (INDEPENDENT_AMBULATORY_CARE_PROVIDER_SITE_OTHER): Payer: Medicare Other | Admitting: Vascular Surgery

## 2020-06-01 VITALS — BP 165/74 | HR 63 | Temp 98.0°F | Resp 20 | Ht 73.0 in | Wt 256.0 lb

## 2020-06-01 DIAGNOSIS — I6523 Occlusion and stenosis of bilateral carotid arteries: Secondary | ICD-10-CM

## 2020-06-01 NOTE — Progress Notes (Signed)
REASON FOR VISIT:   Follow-up of bilateral carotid disease  MEDICAL ISSUES:   BILATERAL CAROTID ARTERY DISEASE: This patient has undergone a previous left carotid endarterectomy at the time of his CABG in July 2018.  He subsequently had a right carotid endarterectomy in August 2019.  Both carotid endarterectomy sites are widely patent.  He quit smoking in 2018.  He is on aspirin and and on a statin.  I have ordered a follow-up carotid duplex scan in 1 year and I will see him back at that time.  LEFT SUBCLAVIAN ARTERY STENOSIS: The patient does have a left subclavian artery stenosis by duplex.  There is a slight discrepancy in the arm pressures and I have instructed him to have his blood pressure always taken on the right arm which would be more accurate.  However the subclavian stenosis is asymptomatic and I would not recommend any further work-up for this.  PERIPHERAL VASCULAR DISEASE: She has evidence of infrainguinal arterial occlusive disease on the right.  I can palpate a posterior tibial pulse on the left but no pulses on the right.  However the foot is warm and well perfused.  I encouraged him to stay as active as possible.  LEG SWELLING: He has had some mild leg swelling and I have instructed him on the correct position to elevate his legs.  He does not have evidence of advanced venous disease.   HPI:   Paul Mcneil is a pleasant 74 y.o. male with a history of bilateral carotid disease.  He underwent a left carotid endarterectomy concomitant with CABG on 02/15/2017.  He subsequently had a right carotid endarterectomy in August 2019.  He comes in for yearly follow-up visit.  He denies any history of stroke, TIAs, expressive or receptive aphasia, or amaurosis fugax.  His only complaint has been some mild leg swelling.  He was recently started on a diuretic at a very small dose given his chronic kidney disease.  He is not a smoker.  He is on aspirin and is on a statin.  Past  Medical History:  Diagnosis Date  . Anemia 1964  . Arthritis    "touch starting to show up in the joints" (12/11/2013)  . CAD (coronary artery disease), native coronary artery    a. Cath 01/30/17 showing multivessel CAD --> underwent CABG on 02/15/2017 with LIMA-LAD, SVG-PDA, and SVG-Ramus-OM1.   . Carotid artery calcification    a. 01/2017: s/p L CEA performed at time of CABG; Existing Mod-Severe R Carotid stenosis  . Chronic kidney disease    "my kidneys are down to 30% function"  . COPD (chronic obstructive pulmonary disease) ([place])    "they said I had it"  . Diabetes ([place])   . Dyspnea    with exertion  . Former heavy tobacco smoker   . GERD (gastroesophageal reflux disease)    Dr. Michail Sermon  . History of alcohol abuse   . Hyperlipemia     Family History  Problem Relation Age of Onset  . Rheum arthritis Mother   . Cancer Father        BONE MARROW    SOCIAL HISTORY: Social History   Tobacco Use  . Smoking status: Former Smoker    Packs/day: 2.00    Years: 55.00    Pack years: 110.00    Types: Cigarettes    Quit date: 09/21/2016    Years since quitting: 3.6  . Smokeless tobacco: Never Used  Substance Use Topics  . Alcohol  use: No    Alcohol/week: 20.0 standard drinks    Types: 20 Glasses of wine per week    Comment: quit 1 year ago- in 2018    Allergies  Allergen Reactions  . Ceclor [Cefaclor] Anaphylaxis, Hives and Other (See Comments)    BASED ON CRITERIA FOR ANAPHYLAXIS HIVES + HYPOTENSION    Current Outpatient Medications  Medication Sig Dispense Refill  . aspirin 81 MG tablet Take 1 tablet (81 mg total) by mouth daily. (Patient taking differently: Take 162 mg by mouth every morning. ) 30 tablet 6  . bisoprolol (ZEBETA) 5 MG tablet Take 1.5 tablets (7.5 mg total) by mouth every morning. 135 tablet 3  . cholecalciferol (VITAMIN D3) 25 MCG (1000 UT) tablet Take 1,000 Units by mouth daily.    Marland Kitchen esomeprazole (NEXIUM) 40 MG capsule Take 1 capsule (40 mg total) by  mouth daily as needed (for acid reflux). 90 capsule 3  . Lactobacillus Casei-Folic Acid (RESTORA RX) 60-1.25 MG CAPS Take 1 tablet by mouth daily as needed (for digestive health).     Ernest Mallick FLEXTOUCH 100 UNIT/ML Pen Inject 15 Units into the skin 2 (two) times daily.     . Multiple Vitamin (MULTIVITAMIN WITH MINERALS) TABS tablet Take 1 tablet by mouth every other day. Advanced Senior Formula    . omega-3 acid ethyl esters (LOVAZA) 1 g capsule Take 1 capsule (1 g total) by mouth 2 (two) times daily. 180 capsule 3  . rosuvastatin (CRESTOR) 40 MG tablet Take 1 tablet (40 mg total) by mouth daily. 90 tablet 2   No current facility-administered medications for this visit.    REVIEW OF SYSTEMS:  [X]  denotes positive finding, [ ]  denotes negative finding Cardiac  Comments:  Chest pain or chest pressure:    Shortness of breath upon exertion:    Short of breath when lying flat:    Irregular heart rhythm:        Vascular    Pain in calf, thigh, or hip brought on by ambulation:    Pain in feet at night that wakes you up from your sleep:     Blood clot in your veins:    Leg swelling:  x       Pulmonary    Oxygen at home:    Productive cough:     Wheezing:         Neurologic    Sudden weakness in arms or legs:     Sudden numbness in arms or legs:     Sudden onset of difficulty speaking or slurred speech:    Temporary loss of vision in one eye:     Problems with dizziness:         Gastrointestinal    Blood in stool:     Vomited blood:         Genitourinary    Burning when urinating:     Blood in urine:        Psychiatric    Major depression:         Hematologic    Bleeding problems:    Problems with blood clotting too easily:        Skin    Rashes or ulcers:        Constitutional    Fever or chills:     PHYSICAL EXAM:   Vitals:   06/01/20 1335 06/01/20 1337  BP: 135/80 (!) 165/74  Pulse: 63   Resp: 20   Temp: 98 F (36.7 C)  SpO2: 95%   Weight: 256 lb (116.1  kg)   Height: 6\' 1"  (1.854 m)     GENERAL: The patient is a well-nourished male, in no acute distress. The vital signs are documented above. CARDIAC: There is a regular rate and rhythm.  VASCULAR: I do not detect carotid bruits. I cannot palpate pulses in the right foot however the right foot is warm and well-perfused. He has a palpable posterior tibial pulse on the left. PULMONARY: There is good air exchange bilaterally without wheezing or rales. ABDOMEN: Soft and non-tender with normal pitched bowel sounds.  MUSCULOSKELETAL: He has had a previous right great toe amputation. NEUROLOGIC: No focal weakness or paresthesias are detected. SKIN: There are no ulcers or rashes noted. PSYCHIATRIC: The patient has a normal affect.  DATA:    CAROTID DUPLEX: I have independently interpreted his carotid duplex scan today.  On the right side, the carotid endarterectomy site is widely patent without evidence of restenosis.  The right vertebral artery is patent with antegrade flow.  On the left side the left carotid endarterectomy site is widely patent without evidence of restenosis.  The left vertebral artery is patent with antegrade flow it is somewhat atypical.  There is evidence of a left subclavian artery stenosis.  [name] [facility] [phone]

## 2020-06-14 DIAGNOSIS — N189 Chronic kidney disease, unspecified: Secondary | ICD-10-CM | POA: Diagnosis not present

## 2020-06-14 DIAGNOSIS — N2581 Secondary hyperparathyroidism of renal origin: Secondary | ICD-10-CM | POA: Diagnosis not present

## 2020-06-14 DIAGNOSIS — N184 Chronic kidney disease, stage 4 (severe): Secondary | ICD-10-CM | POA: Diagnosis not present

## 2020-06-14 DIAGNOSIS — D631 Anemia in chronic kidney disease: Secondary | ICD-10-CM | POA: Diagnosis not present

## 2020-06-14 DIAGNOSIS — R609 Edema, unspecified: Secondary | ICD-10-CM | POA: Diagnosis not present

## 2020-06-14 DIAGNOSIS — I129 Hypertensive chronic kidney disease with stage 1 through stage 4 chronic kidney disease, or unspecified chronic kidney disease: Secondary | ICD-10-CM | POA: Diagnosis not present

## 2020-06-20 ENCOUNTER — Ambulatory Visit (INDEPENDENT_AMBULATORY_CARE_PROVIDER_SITE_OTHER): Payer: Medicare Other | Admitting: Cardiology

## 2020-06-20 ENCOUNTER — Other Ambulatory Visit: Payer: Self-pay

## 2020-06-20 ENCOUNTER — Encounter: Payer: Self-pay | Admitting: Cardiology

## 2020-06-20 VITALS — BP 105/60 | HR 53 | Ht 73.0 in | Wt 253.2 lb

## 2020-06-20 DIAGNOSIS — E785 Hyperlipidemia, unspecified: Secondary | ICD-10-CM | POA: Diagnosis not present

## 2020-06-20 DIAGNOSIS — I10 Essential (primary) hypertension: Secondary | ICD-10-CM | POA: Diagnosis not present

## 2020-06-20 DIAGNOSIS — I739 Peripheral vascular disease, unspecified: Secondary | ICD-10-CM | POA: Diagnosis not present

## 2020-06-20 DIAGNOSIS — I200 Unstable angina: Secondary | ICD-10-CM

## 2020-06-20 DIAGNOSIS — Z951 Presence of aortocoronary bypass graft: Secondary | ICD-10-CM | POA: Diagnosis not present

## 2020-06-20 DIAGNOSIS — I2510 Atherosclerotic heart disease of native coronary artery without angina pectoris: Secondary | ICD-10-CM | POA: Diagnosis not present

## 2020-06-20 DIAGNOSIS — E1169 Type 2 diabetes mellitus with other specified complication: Secondary | ICD-10-CM

## 2020-06-20 DIAGNOSIS — I425 Other restrictive cardiomyopathy: Secondary | ICD-10-CM

## 2020-06-20 MED ORDER — ASPIRIN EC 81 MG PO TBEC: 81.0000 mg | DELAYED_RELEASE_TABLET | Freq: Two times a day (BID) | ORAL | 3 refills | Status: DC

## 2020-06-20 NOTE — Progress Notes (Signed)
Primary Care Provider: [name], MD Cardiologist: Glenetta Hew, MD  Nephrologist: Dr. Carolin Sicks Electrophysiologist: None  Clinic Note: Chief Complaint  Patient presents with  . Follow-up    Delayed annual; first in person and several years.  . Coronary Artery Disease    No angina.     HPI:    Paul Mcneil is a 74 y.o. male with a PMH notable for CAD-CABG who presents today for essentially 65-month follow-up.  Initial consult in June 2018 for atypical anginal chest pain.  Referred for Myoview stress test that was abnormal.  Underwent cardiac catheterization showing severe CAD and referred for CABG.  CABG + L CEA -  02/15/2017: LIMA to LAD, SVG to PDA, SVG to Ramus and OM1 w/ Left Carotid CEA. (Dr. Roxan Hockey & Dr. Scot Dock) ? Postop course in the outpatient setting complicated by superficial wound infection treated with debridement and oral antibiotics. -->  Now fully healed ? Also noted to have 60-79% right ICA disease.  Followed by Dr. Scot Dock ? Carotid Dopplers August 14, 2017: RICA 60-79%. L ICA s/p CEA - < 39%.  Right vertebral artery with patent antegrade flow.  Partial left subclavian steal noted.  Left subclavian artery is stenotic.  Right subclavian flow normal.  Problem List Items Addressed This Visit    CAD, multiple vessel - Primary (Chronic)    Pretty significant multivessel CAD found on cath in July 2018.  Referred for CABG x4 with Left CEA. Symptoms leading to this with progressive angina/dyspnea on exertion.  Since revascularization, he has not had any further episodes.  Maybe some mild residual diastolic dysfunction.    Plan: Continue current dose of beta-blocker and furosemide. On aspirin which apparently he has been taking 2 tabs a day-this is likely related to his carotid disease. Continue statin and Lovaza      Relevant Medications   furosemide (LASIX) 40 MG tablet   bisoprolol (ZEBETA) 10 MG tablet   aspirin EC 81 MG tablet   Other  Relevant Orders   EKG 12-Lead (Completed)   Unstable angina (HCC) (Chronic)    No symptoms of unstable angina/exertional dyspnea since CABG revascularization.  Plan: Continue beta-blocker, statin and Lovaza along with aspirin 81 mg. Not requiring nitroglycerin or additional reduction.      Relevant Medications   furosemide (LASIX) 40 MG tablet   bisoprolol (ZEBETA) 10 MG tablet   aspirin EC 81 MG tablet   Hyperlipidemia associated with type 2 diabetes mellitus ([place]) (Chronic)    Labs are 2019 were way out of control.  As of 2020 we added Lovaza to his Crestor.  Since doing that now most recent labs in June showed LDL elevated at 54.  Triglycerides are still little high.  This may be related to glycemic control.  Consider Wilder Glade or potentially Ozempic which may also help obesity.      Relevant Medications   aspirin EC 81 MG tablet   Essential hypertension (Chronic)    Blood pressure is pretty well controlled on 10 mg of bisoprolol plus STEMI dose of furosemide.      Relevant Medications   furosemide (LASIX) 40 MG tablet   bisoprolol (ZEBETA) 10 MG tablet   aspirin EC 81 MG tablet   S/P CABG x 4 (Chronic)    Just about 3 and half years out from his CABG.  Would be due for surveillance stress test to be done probably after next visit unless he begins having symptoms sooner.      Claudication  of both lower extremities (HCC)   Relevant Medications   aspirin EC 81 MG tablet     Paul Mcneil was last seen in person on September 06, 2017--he is doing quite well from a cardiac standpoint.  Was released from cardiac surgery.  Doing more frequent walking and increasing activity.  Was scheduled to see a nephrologist for worsening renal function.  Energy level improving.  Doing well with smoking cessation.  No further chest pain.  Mild end of day swelling.  Most recent cardiology evaluation was February 2021 via telemedicine with Almyra Deforest, PA.  He denied any exertional chest  discomfort or dyspnea.  Blood pressures are little bit elevated usually in the 329J systolic.--Plan was to increase bisoprolol to 7.5 mg.  Recent Hospitalizations: Follow-up  Reviewed  CV studies:    The following studies were reviewed today: (if available, images/films reviewed: From Epic Chart or Care Everywhere) . Carotid Dopplers 06/01/2020: Bilateral carotid ICA 1-39%.  Patent CEA.  Bilateral vertebral arteries show antegrade flow.  Atypical right sided waveform.  Left subclavian artery is stenotic (relatively new) with normal flow hemodynamics in the right subclavian.   Interval History:   Paul COMMISSO returns here for delayed in person follow-up with no major cardiac complaints.  He is accompanied by his daughter.  She concurs that really he has not had any chest pain or dyspnea with rest or exertion.  No further dizziness or wooziness. His prior anginal equivalent was mostly exertional dyspnea and fatigue with tightness if he kept going.  He has not had any of the symptoms.  He says he has edema off and on but it has gone down with change in his diet.  Also if he puts his feet up any residual edema will go down.  His exercise regimen includes riding his stationary bicycle as well as doing some calisthenics she needs.  He does sometimes exercise at least every day.  He has made a significant change to his diet with attempt to try to lose weight and eat more healthy.  He has has titrated up his beta-blocker dose to 2tablets daily as opposed to 1.5 (totaling 10 mg).  CV Review of Symptoms (Summary): no chest pain or dyspnea on exertion positive for - Well-controlled edema. negative for - irregular heartbeat, orthopnea, palpitations, paroxysmal nocturnal dyspnea, rapid heart rate, shortness of breath or Lightheadedness, dizziness, wooziness, syncope/near syncope or TIA/amaurosis fugax, claudication  The patient does not have symptoms concerning for COVID-19 infection (fever, chills,  cough, or new shortness of breath).   REVIEWED OF SYSTEMS   Review of Systems  Constitutional: Negative for malaise/fatigue and weight loss (He is now trying to lose weight having gained 20 pounds since his last in person visit).  HENT: Negative for congestion and nosebleeds.   Respiratory: Positive for cough (Sometimes has a morning cough.).   Cardiovascular: Positive for leg swelling (Stable).  Gastrointestinal: Negative for blood in stool and melena.  Genitourinary: Negative for hematuria.  Musculoskeletal: Positive for back pain and joint pain (Hips and knees).  Neurological: Negative for dizziness, focal weakness and headaches.  Psychiatric/Behavioral: Negative for memory loss. The patient is not nervous/anxious and does not have insomnia.    I have reviewed and (if needed) personally updated the patient's problem list, medications, allergies, past medical and surgical history, social and family history.   PAST MEDICAL HISTORY   Past Medical History:  Diagnosis Date  . Anemia 1964  . Arthritis    "touch starting to  show up in the joints" (12/11/2013)  . CAD (coronary artery disease), native coronary artery    a. Cath 01/30/17 showing multivessel CAD --> underwent CABG on 02/15/2017 with LIMA-LAD, SVG-PDA, and SVG-Ramus-OM1.   . Carotid artery calcification    a. 01/2017: s/p L CEA performed at time of CABG; Existing Mod-Severe R Carotid stenosis  . Chronic kidney disease, stage IV (severe) ([place])    "my kidneys are down to 30% function"; nephrologist-Dr. Carolin Sicks  . COPD (chronic obstructive pulmonary disease) ([place])    "they said I had it"  . Diabetes ([place])   . Dyspnea    with exertion  . Former heavy tobacco smoker   . GERD (gastroesophageal reflux disease)    Dr. Michail Sermon  . History of alcohol abuse   . Hx of CABG x 4 02/15/2017   LIMA-LAD, SVG-PDA, SVG-RI-OM 1  . Hyperlipemia   -> CKD-4   PAST SURGICAL HISTORY   Past Surgical History:  Procedure Laterality Date  .  APPENDECTOMY  12/11/2013   "laparoscopic"  . BRONCHOSCOPY    . CHOLECYSTECTOMY  ~ 2010  . CORONARY ARTERY BYPASS GRAFT N/A 02/15/2017   Procedure: CORONARY ARTERY BYPASS GRAFTING (CABG) x4 :LAD to  LIMA, SVG to PDA , Sequential SVG to OM1 and Ramus;  Surgeon: Melrose Nakayama, MD;  Location: [facility];  Service: Open Heart Surgery;  Laterality: N/A;  . ENDARTERECTOMY Left 02/15/2017   Procedure: LEFT ENDARTERECTOMY CAROTID;  Surgeon: Angelia Mould, MD;  Location: [facility];  Service: Vascular;  Laterality: Left;  . ENDARTERECTOMY Right 03/04/2018   Procedure: ENDARTERECTOMY CAROTID RIGHT WITH PRIMARY CLOSURE OF ARTERY;  Surgeon: Angelia Mould, MD;  Location: [facility];  Service: Vascular;  Laterality: Right;  . LAPAROSCOPIC APPENDECTOMY N/A 12/11/2013   Procedure: APPENDECTOMY LAPAROSCOPIC;  Surgeon: Imogene Burn. Georgette Dover, MD;  Location: [facility];  Service: General;  Laterality: N/A;  . RIGHT/LEFT HEART CATH AND CORONARY ANGIOGRAPHY N/A 01/30/2017   Procedure: Right/Left Heart Cath and Coronary Angiography;  Surgeon: Troy Sine, MD;  Location: [facility];  Service: Cardiovascular: Normal RHC pressures.  Significant multivessel CAD with 50-60% smooth d-LM, 40% mid LAD. 50% mid LAD stenoses; RI --70% ostial & 80% prox RI; 90% pCx w/  80% and 70% distal; 80% oxt-prox & 90% prox-mid -> 100% CTO distal mid RCA w/ L-R collaterals  . TEE WITHOUT CARDIOVERSION N/A 02/15/2017   Procedure: TRANSESOPHAGEAL ECHOCARDIOGRAM (TEE);  Surgeon: Melrose Nakayama, MD;  Location: [facility];  Service: Open Heart Surgery: Vigorous wall motion.  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  Mild MR noted.  . TONSILLECTOMY AND ADENOIDECTOMY  1950's  . 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  Otherwise normal cavity size.  Mild LVH.  EF 60-65%.  GR 1 DD.  Mild MR.    Right And Left Heart Cath 01/30/2017:   Normal right heart pressures.  --Precath    Immunization History  Administered Date(s) Administered  . Pneumococcal Polysaccharide-23 02/01/2017    MEDICATIONS/ALLERGIES   Current Meds  Medication Sig  . bisoprolol (ZEBETA) 10 MG tablet Take 10 mg by mouth daily.  . cholecalciferol (VITAMIN D) 25 MCG (1000 UNIT) tablet Take 4,000 Units by mouth daily.  Marland Kitchen esomeprazole (NEXIUM) 40 MG capsule Take 1 capsule (40 mg total) by mouth daily as needed (for acid reflux).  . furosemide (LASIX) 40 MG tablet Take 40 mg by mouth daily.  Marland Kitchen  Lactobacillus Casei-Folic Acid (RESTORA RX) 60-1.25 MG CAPS Take 1 tablet by mouth daily as needed (for digestive health).   Ernest Mallick FLEXTOUCH 100 UNIT/ML Pen Inject 15 Units into the skin 2 (two) times daily.   . Multiple Vitamin (MULTIVITAMIN WITH MINERALS) TABS tablet Take 1 tablet by mouth every other day. Advanced Senior Formula  . omega-3 acid ethyl esters (LOVAZA) 1 g capsule Take 1 capsule (1 g total) by mouth 2 (two) times daily.  . rosuvastatin (CRESTOR) 40 MG tablet Take 1 tablet (40 mg total) by mouth daily.  . [DISCONTINUED] aspirin 81 MG tablet Take 1 tablet (81 mg total) by mouth daily. (Patient taking differently: Take 162 mg by mouth every morning. )  . [DISCONTINUED] aspirin EC 81 MG tablet Take 162 mg by mouth daily. Swallow whole.  . [DISCONTINUED] bisoprolol (ZEBETA) 5 MG tablet Take 1.5 tablets (7.5 mg total) by mouth every morning.  . [DISCONTINUED] cholecalciferol (VITAMIN D3) 25 MCG (1000 UT) tablet Take 1,000 Units by mouth daily.    Allergies  Allergen Reactions  . Ceclor [Cefaclor] Anaphylaxis, Hives and Other (See Comments)    BASED ON CRITERIA FOR ANAPHYLAXIS HIVES + HYPOTENSION    SOCIAL HISTORY/FAMILY HISTORY   Reviewed in Epic:  Pertinent findings: Exercising routinely riding exercise bicycle and some exercise machines.  He also does yard work including raking leaves, mowing, weed whacking etc.  He also splits  wood.  OBJCTIVE -PE, EKG, labs   Wt Readings from Last 3 Encounters:  06/20/20 253 lb 3.2 oz (114.9 kg)  06/01/20 256 lb (116.1 kg)  01/06/20 250 lb (113.4 kg)    Physical Exam: BP 105/60   Pulse (!) 53   Ht 6\' 1"  (1.854 m)   Wt 253 lb 3.2 oz (114.9 kg)   SpO2 96%   BMI 33.41 kg/m  Physical Exam Vitals reviewed.  Constitutional:      General: He is not in acute distress.    Appearance: Normal appearance. He is obese. He is not ill-appearing or toxic-appearing.  HENT:     Head: Normocephalic and atraumatic.  Neck:     Vascular: No carotid bruit.  Cardiovascular:     Rate and Rhythm: Regular rhythm. Bradycardia present.  No extrasystoles are present.    Pulses: Normal pulses and intact distal pulses.     Heart sounds: S1 normal and S2 normal. Heart sounds are distant. No murmur heard. No gallop.   Pulmonary:     Effort: Pulmonary effort is normal. No respiratory distress.     Breath sounds: Normal breath sounds.  Chest:     Chest wall: No tenderness.  Musculoskeletal:        General: Swelling (Trivial bilateral LE) present. Normal range of motion.     Cervical back: Normal range of motion and neck supple.  Neurological:     General: No focal deficit present.     Mental Status: He is alert and oriented to person, place, and time.  Psychiatric:        Mood and Affect: Mood normal.        Behavior: Behavior normal.        Thought Content: Thought content normal.        Judgment: Judgment normal.     Comments: Somewhat stoic.      Adult ECG Report  Rate: 53 ;  Rhythm: sinus bradycardia and 1  AVB, inferior migration current.  ST and T wave changes in lateral leads.;   Narrative Interpretation: Stable EKG.  Recent Labs:   December 31, 2019: TC 121, TG 253, HDL 26, LDL 54.  Number 11/10/2019: Cr 3.05, K+ 4.0.  December 2020, total cholesterol 131, HDL 25, LDL 44, triglyceride 417.  A1c 6.6.  ->  We recommended adding Lovaza 1000 mg twice daily to Crestor. Lab Results   Component Value Date   CHOL 166 11/14/2017   HDL 26 (L) 11/14/2017   LDLCALC 100 (H) 11/14/2017   TRIG 201 (H) 11/14/2017   CHOLHDL 6.4 (H) 11/14/2017   Lab Results  Component Value Date   CREATININE 2.42 (H) 03/05/2018   BUN 38 (H) 03/05/2018   NA 137 03/05/2018   K 4.3 03/05/2018   CL 112 (H) 03/05/2018   CO2 18 (L) 03/05/2018   Lab Results  Component Value Date   TSH 2.29 01/10/2017    ASSESSMENT/PLAN    Problem List Items Addressed This Visit    CAD, multiple vessel - Primary (Chronic)    Pretty significant multivessel CAD found on cath in July 2018.  Referred for CABG x4 with Left CEA. Symptoms leading to this with progressive angina/dyspnea on exertion.  Since revascularization, he has not had any further episodes.  Maybe some mild residual diastolic dysfunction.    Plan: Continue current dose of beta-blocker and furosemide. On aspirin which apparently he has been taking 2 tabs a day-this is likely related to his carotid disease. Continue statin and Lovaza      Relevant Medications   furosemide (LASIX) 40 MG tablet   bisoprolol (ZEBETA) 10 MG tablet   aspirin EC 81 MG tablet   Other Relevant Orders   EKG 12-Lead (Completed)   Unstable angina (HCC) (Chronic)    No symptoms of unstable angina/exertional dyspnea since CABG revascularization.  Plan: Continue beta-blocker, statin and Lovaza along with aspirin 81 mg. Not requiring nitroglycerin or additional reduction.      Relevant Medications   furosemide (LASIX) 40 MG tablet   bisoprolol (ZEBETA) 10 MG tablet   aspirin EC 81 MG tablet   Hyperlipidemia associated with type 2 diabetes mellitus ([place]) (Chronic)    Labs are 2019 were way out of control.  As of 2020 we added Lovaza to his Crestor.  Since doing that now most recent labs in June showed LDL elevated at 54.  Triglycerides are still little high.  This may be related to glycemic control.  Consider Wilder Glade or potentially Ozempic which may also help  obesity.      Relevant Medications   aspirin EC 81 MG tablet   Essential hypertension (Chronic)    Blood pressure is pretty well controlled on 10 mg of bisoprolol plus STEMI dose of furosemide.      Relevant Medications   furosemide (LASIX) 40 MG tablet   bisoprolol (ZEBETA) 10 MG tablet   aspirin EC 81 MG tablet   S/P CABG x 4 (Chronic)    Just about 3 and half years out from his CABG.  Would be due for surveillance stress test to be done probably after next visit unless he begins having symptoms sooner.      Claudication of both lower extremities (HCC)   Relevant Medications   aspirin EC 81 MG tablet       COVID-19 Education: The signs and symptoms of COVID-19 were discussed with the patient and how to seek care for testing (follow up with PCP or arrange E-visit).   The importance of social distancing and COVID-19 vaccination was discussed today. 1 min The patient is practicing  social distancing & Masking.   I spent a total of 10minutes with the patient spent in direct patient consultation.  Additional time spent with chart review  / charting (studies, outside notes, etc): 10 Total Time: 38 min   Current medicines are reviewed at length with the patient today.  (+/- concerns) n/a  This visit occurred during the SARS-CoV-2 public health emergency.  Safety protocols were in place, including screening questions prior to the visit, additional usage of staff PPE, and extensive cleaning of exam room while observing appropriate contact time as indicated for disinfecting solutions.  Notice: This dictation was prepared with Dragon dictation along with smaller phrase technology. Any transcriptional errors that result from this process are unintentional and may not be corrected upon review.  Patient Instructions / Medication Changes & Studies & Tests Ordered   Patient Instructions  Medication Instructions:   Continue taking Aspirin 81 mg twice a day   cont intine taking Lovaza 1  gm  (1 tablet)  twice a day   *If you need a refill on your cardiac medications before your next appointment, please call your pharmacy*   Lab Work: Not needed    Testing/Procedures: Not needed   Follow-Up: At [facility], you and your health needs are our priority.  As part of our continuing mission to provide you with exceptional heart care, we have created designated Provider Care Teams.  These Care Teams include your primary Cardiologist (physician) and Advanced Practice Providers (APPs -  Physician Assistants and Nurse Practitioners) who all work together to provide you with the care you need, when you need it.     Your next appointment:   12 month(s)  The format for your next appointment:   In Person  Provider:   Glenetta Hew, MD      Studies Ordered:   Orders Placed This Encounter  Procedures  . EKG 12-Lead     Glenetta Hew, M.D., M.S. Interventional Cardiologist   Pager # [phone] Phone # [phone] [address] [place], [place] [address]   Thank you for choosing Heartcare at [facility]!!

## 2020-06-20 NOTE — Patient Instructions (Addendum)
Medication Instructions:   Continue taking Aspirin 81 mg twice a day   cont intine taking Lovaza 1 gm  (1 tablet)  twice a day   *If you need a refill on your cardiac medications before your next appointment, please call your pharmacy*   Lab Work: Not needed    Testing/Procedures: Not needed   Follow-Up: At [facility], you and your health needs are our priority.  As part of our continuing mission to provide you with exceptional heart care, we have created designated Provider Care Teams.  These Care Teams include your primary Cardiologist (physician) and Advanced Practice Providers (APPs -  Physician Assistants and Nurse Practitioners) who all work together to provide you with the care you need, when you need it.     Your next appointment:   12 month(s)  The format for your next appointment:   In Person  Provider:   Glenetta Hew, MD

## 2020-06-23 DIAGNOSIS — Z1159 Encounter for screening for other viral diseases: Secondary | ICD-10-CM | POA: Diagnosis not present

## 2020-07-01 ENCOUNTER — Encounter: Payer: Self-pay | Admitting: Cardiology

## 2020-07-01 NOTE — Assessment & Plan Note (Signed)
Pretty significant multivessel CAD found on cath in July 2018.  Referred for CABG x4 with Left CEA. Symptoms leading to this with progressive angina/dyspnea on exertion.  Since revascularization, he has not had any further episodes.  Maybe some mild residual diastolic dysfunction.    Plan: Continue current dose of beta-blocker and furosemide. On aspirin which apparently he has been taking 2 tabs a day-this is likely related to his carotid disease. Continue statin and Lovaza

## 2020-07-01 NOTE — Assessment & Plan Note (Signed)
Just about 3 and half years out from his CABG.  Would be due for surveillance stress test to be done probably after next visit unless he begins having symptoms sooner.

## 2020-07-01 NOTE — Assessment & Plan Note (Signed)
Blood pressure is pretty well controlled on 10 mg of bisoprolol plus STEMI dose of furosemide.

## 2020-07-01 NOTE — Assessment & Plan Note (Signed)
Labs are 2019 were way out of control.  As of 2020 we added Lovaza to his Crestor.  Since doing that now most recent labs in June showed LDL elevated at 54.  Triglycerides are still little high.  This may be related to glycemic control.  Consider Wilder Glade or potentially Ozempic which may also help obesity.

## 2020-07-01 NOTE — Assessment & Plan Note (Signed)
No symptoms of unstable angina/exertional dyspnea since CABG revascularization.  Plan: Continue beta-blocker, statin and Lovaza along with aspirin 81 mg. Not requiring nitroglycerin or additional reduction.

## 2020-07-06 DIAGNOSIS — E1122 Type 2 diabetes mellitus with diabetic chronic kidney disease: Secondary | ICD-10-CM | POA: Diagnosis not present

## 2020-07-06 DIAGNOSIS — I10 Essential (primary) hypertension: Secondary | ICD-10-CM | POA: Diagnosis not present

## 2020-07-06 DIAGNOSIS — Z0001 Encounter for general adult medical examination with abnormal findings: Secondary | ICD-10-CM | POA: Diagnosis not present

## 2020-07-06 DIAGNOSIS — N184 Chronic kidney disease, stage 4 (severe): Secondary | ICD-10-CM | POA: Diagnosis not present

## 2020-07-06 DIAGNOSIS — J439 Emphysema, unspecified: Secondary | ICD-10-CM | POA: Diagnosis not present

## 2020-07-06 DIAGNOSIS — Z79899 Other long term (current) drug therapy: Secondary | ICD-10-CM | POA: Diagnosis not present

## 2020-07-06 DIAGNOSIS — I700 Atherosclerosis of aorta: Secondary | ICD-10-CM | POA: Diagnosis not present

## 2020-07-06 DIAGNOSIS — E7800 Pure hypercholesterolemia, unspecified: Secondary | ICD-10-CM | POA: Diagnosis not present

## 2020-08-15 DIAGNOSIS — E1122 Type 2 diabetes mellitus with diabetic chronic kidney disease: Secondary | ICD-10-CM | POA: Diagnosis not present

## 2020-08-15 DIAGNOSIS — Z794 Long term (current) use of insulin: Secondary | ICD-10-CM | POA: Diagnosis not present

## 2020-08-22 DIAGNOSIS — R609 Edema, unspecified: Secondary | ICD-10-CM | POA: Diagnosis not present

## 2020-08-22 DIAGNOSIS — I129 Hypertensive chronic kidney disease with stage 1 through stage 4 chronic kidney disease, or unspecified chronic kidney disease: Secondary | ICD-10-CM | POA: Diagnosis not present

## 2020-08-22 DIAGNOSIS — D631 Anemia in chronic kidney disease: Secondary | ICD-10-CM | POA: Diagnosis not present

## 2020-08-22 DIAGNOSIS — N2581 Secondary hyperparathyroidism of renal origin: Secondary | ICD-10-CM | POA: Diagnosis not present

## 2020-08-22 DIAGNOSIS — N184 Chronic kidney disease, stage 4 (severe): Secondary | ICD-10-CM | POA: Diagnosis not present

## 2020-10-19 ENCOUNTER — Other Ambulatory Visit: Payer: Self-pay

## 2020-10-19 MED ORDER — OMEGA-3-ACID ETHYL ESTERS 1 G PO CAPS: 1.0000 g | ORAL_CAPSULE | Freq: Two times a day (BID) | ORAL | 3 refills | Status: AC

## 2020-10-21 DIAGNOSIS — D631 Anemia in chronic kidney disease: Secondary | ICD-10-CM | POA: Diagnosis not present

## 2020-10-21 DIAGNOSIS — N2581 Secondary hyperparathyroidism of renal origin: Secondary | ICD-10-CM | POA: Diagnosis not present

## 2020-10-21 DIAGNOSIS — I129 Hypertensive chronic kidney disease with stage 1 through stage 4 chronic kidney disease, or unspecified chronic kidney disease: Secondary | ICD-10-CM | POA: Diagnosis not present

## 2020-10-21 DIAGNOSIS — I2510 Atherosclerotic heart disease of native coronary artery without angina pectoris: Secondary | ICD-10-CM | POA: Diagnosis not present

## 2020-10-21 DIAGNOSIS — N184 Chronic kidney disease, stage 4 (severe): Secondary | ICD-10-CM | POA: Diagnosis not present

## 2020-10-21 DIAGNOSIS — R609 Edema, unspecified: Secondary | ICD-10-CM | POA: Diagnosis not present

## 2020-10-21 DIAGNOSIS — N189 Chronic kidney disease, unspecified: Secondary | ICD-10-CM | POA: Diagnosis not present

## 2020-11-15 DIAGNOSIS — J439 Emphysema, unspecified: Secondary | ICD-10-CM | POA: Diagnosis not present

## 2020-11-15 DIAGNOSIS — I2510 Atherosclerotic heart disease of native coronary artery without angina pectoris: Secondary | ICD-10-CM | POA: Diagnosis not present

## 2020-11-15 DIAGNOSIS — Z794 Long term (current) use of insulin: Secondary | ICD-10-CM | POA: Diagnosis not present

## 2020-11-15 DIAGNOSIS — I10 Essential (primary) hypertension: Secondary | ICD-10-CM | POA: Diagnosis not present

## 2020-11-15 DIAGNOSIS — E7800 Pure hypercholesterolemia, unspecified: Secondary | ICD-10-CM | POA: Diagnosis not present

## 2020-11-15 DIAGNOSIS — E1122 Type 2 diabetes mellitus with diabetic chronic kidney disease: Secondary | ICD-10-CM | POA: Diagnosis not present

## 2020-11-15 DIAGNOSIS — Z79899 Other long term (current) drug therapy: Secondary | ICD-10-CM | POA: Diagnosis not present

## 2020-11-29 ENCOUNTER — Telehealth: Payer: Self-pay | Admitting: Cardiology

## 2020-11-29 NOTE — Telephone Encounter (Signed)
Pt c/o medication issue:  1. Name of Medication: omega-3 acid ethyl esters (LOVAZA) 1 g capsule  2. How are you currently taking this medication (dosage and times per day)? 1 tablet twice a day  3. Are you having a reaction (difficulty breathing--STAT)? no  4. What is your medication issue? Anderson Malta from [facility] states the medication requires a prior authorization. She states they only need a call back when it is approved. Phone: [phone]

## 2020-12-22 DIAGNOSIS — E7800 Pure hypercholesterolemia, unspecified: Secondary | ICD-10-CM | POA: Diagnosis not present

## 2020-12-22 DIAGNOSIS — N184 Chronic kidney disease, stage 4 (severe): Secondary | ICD-10-CM | POA: Diagnosis not present

## 2020-12-22 DIAGNOSIS — E1122 Type 2 diabetes mellitus with diabetic chronic kidney disease: Secondary | ICD-10-CM | POA: Diagnosis not present

## 2020-12-26 NOTE — Telephone Encounter (Signed)
Anderson Malta from [facility] called back checking on the status of the prior authorization

## 2021-01-31 ENCOUNTER — Telehealth: Payer: Self-pay | Admitting: *Deleted

## 2021-01-31 NOTE — Telephone Encounter (Signed)
[employer] for omega-3 acid ethyl esters ( lovaza) 1 gram capsules received call - patient needing prior authorization   Patient next appointment recall 05/2021  Last set of labs 12/21 obtained by primary's office.  Dx  CAD -  I 25.10 HYPERLIPDEMIA   E11.69  Paul Mcneil (Key: [id number])

## 2021-02-07 ENCOUNTER — Telehealth: Payer: Self-pay | Admitting: Cardiology

## 2021-02-07 ENCOUNTER — Other Ambulatory Visit: Payer: Self-pay

## 2021-02-07 MED ORDER — ROSUVASTATIN CALCIUM 40 MG PO TABS: 40.0000 mg | ORAL_TABLET | Freq: Every day | ORAL | 1 refills | Status: DC

## 2021-02-07 MED ORDER — BISOPROLOL FUMARATE 10 MG PO TABS: 10.0000 mg | ORAL_TABLET | Freq: Every day | ORAL | 2 refills | Status: AC

## 2021-02-07 NOTE — Telephone Encounter (Signed)
*  STAT* If patient is at the pharmacy, call can be transferred to refill team.   1. Which medications need to be refilled? (please list name of each medication and dose if known) bisoprolol (ZEBETA) 10 MG tablet  2. Which pharmacy/location (including street and city if local pharmacy) is medication to be sent to? [place]  3. Do they need a 30 day or 90 day supply? 90 day   Patient states he would like a call when the refill is sent.

## 2021-02-07 NOTE — Telephone Encounter (Signed)
*  STAT* If patient is at the pharmacy, call can be transferred to refill team.   1. Which medications need to be refilled? (please list name of each medication and dose if known)  rosuvastatin (CRESTOR) 40 MG tablet  2. Which pharmacy/location (including street and city if local pharmacy) is medication to be sent to? [place]   3. Do they need a 30 day or 90 day supply?  90 day supply

## 2021-02-17 ENCOUNTER — Telehealth: Payer: Self-pay | Admitting: *Deleted

## 2021-02-17 NOTE — Telephone Encounter (Signed)
Faxed prior author. papersFor generic Lovaza1 gm twice a day   Awaiting for respone

## 2021-03-01 DIAGNOSIS — N184 Chronic kidney disease, stage 4 (severe): Secondary | ICD-10-CM | POA: Diagnosis not present

## 2021-03-02 NOTE — Telephone Encounter (Signed)
Received confirmation  medication has been approved  02/21/21 to 02/22/22 Omega -3 ethyl esters 1 gm cap  # approval number [id number] for medication    Insurance information  Group :[place]  id# [id number]

## 2021-03-08 DIAGNOSIS — I129 Hypertensive chronic kidney disease with stage 1 through stage 4 chronic kidney disease, or unspecified chronic kidney disease: Secondary | ICD-10-CM | POA: Diagnosis not present

## 2021-03-08 DIAGNOSIS — D631 Anemia in chronic kidney disease: Secondary | ICD-10-CM | POA: Diagnosis not present

## 2021-03-08 DIAGNOSIS — R609 Edema, unspecified: Secondary | ICD-10-CM | POA: Diagnosis not present

## 2021-03-08 DIAGNOSIS — N184 Chronic kidney disease, stage 4 (severe): Secondary | ICD-10-CM | POA: Diagnosis not present

## 2021-03-08 DIAGNOSIS — N2581 Secondary hyperparathyroidism of renal origin: Secondary | ICD-10-CM | POA: Diagnosis not present

## 2021-03-10 DIAGNOSIS — R110 Nausea: Secondary | ICD-10-CM | POA: Diagnosis not present

## 2021-03-10 DIAGNOSIS — K219 Gastro-esophageal reflux disease without esophagitis: Secondary | ICD-10-CM | POA: Diagnosis not present

## 2021-03-10 DIAGNOSIS — E1122 Type 2 diabetes mellitus with diabetic chronic kidney disease: Secondary | ICD-10-CM | POA: Diagnosis not present

## 2021-03-10 DIAGNOSIS — N184 Chronic kidney disease, stage 4 (severe): Secondary | ICD-10-CM | POA: Diagnosis not present

## 2021-03-10 DIAGNOSIS — I10 Essential (primary) hypertension: Secondary | ICD-10-CM | POA: Diagnosis not present

## 2021-05-30 ENCOUNTER — Other Ambulatory Visit: Payer: Self-pay

## 2021-05-30 DIAGNOSIS — I6523 Occlusion and stenosis of bilateral carotid arteries: Secondary | ICD-10-CM

## 2021-06-11 DIAGNOSIS — U071 COVID-19: Secondary | ICD-10-CM | POA: Diagnosis not present

## 2021-06-14 DIAGNOSIS — J449 Chronic obstructive pulmonary disease, unspecified: Secondary | ICD-10-CM | POA: Diagnosis not present

## 2021-06-14 DIAGNOSIS — U071 COVID-19: Secondary | ICD-10-CM | POA: Diagnosis not present

## 2021-06-14 DIAGNOSIS — R059 Cough, unspecified: Secondary | ICD-10-CM | POA: Diagnosis not present

## 2021-06-22 ENCOUNTER — Encounter (HOSPITAL_COMMUNITY): Payer: Medicare Other

## 2021-06-22 ENCOUNTER — Ambulatory Visit: Payer: Medicare Other | Admitting: Vascular Surgery

## 2021-06-30 DIAGNOSIS — N184 Chronic kidney disease, stage 4 (severe): Secondary | ICD-10-CM | POA: Diagnosis not present

## 2021-07-06 DIAGNOSIS — I129 Hypertensive chronic kidney disease with stage 1 through stage 4 chronic kidney disease, or unspecified chronic kidney disease: Secondary | ICD-10-CM | POA: Diagnosis not present

## 2021-07-06 DIAGNOSIS — N2581 Secondary hyperparathyroidism of renal origin: Secondary | ICD-10-CM | POA: Diagnosis not present

## 2021-07-06 DIAGNOSIS — E1122 Type 2 diabetes mellitus with diabetic chronic kidney disease: Secondary | ICD-10-CM | POA: Diagnosis not present

## 2021-07-06 DIAGNOSIS — D631 Anemia in chronic kidney disease: Secondary | ICD-10-CM | POA: Diagnosis not present

## 2021-07-06 DIAGNOSIS — R609 Edema, unspecified: Secondary | ICD-10-CM | POA: Diagnosis not present

## 2021-07-06 DIAGNOSIS — N184 Chronic kidney disease, stage 4 (severe): Secondary | ICD-10-CM | POA: Diagnosis not present

## 2021-07-27 DIAGNOSIS — N184 Chronic kidney disease, stage 4 (severe): Secondary | ICD-10-CM | POA: Diagnosis not present

## 2021-07-27 DIAGNOSIS — I2510 Atherosclerotic heart disease of native coronary artery without angina pectoris: Secondary | ICD-10-CM | POA: Diagnosis not present

## 2021-07-27 DIAGNOSIS — E7800 Pure hypercholesterolemia, unspecified: Secondary | ICD-10-CM | POA: Diagnosis not present

## 2021-07-27 DIAGNOSIS — Z23 Encounter for immunization: Secondary | ICD-10-CM | POA: Diagnosis not present

## 2021-07-27 DIAGNOSIS — I10 Essential (primary) hypertension: Secondary | ICD-10-CM | POA: Diagnosis not present

## 2021-07-27 DIAGNOSIS — E113292 Type 2 diabetes mellitus with mild nonproliferative diabetic retinopathy without macular edema, left eye: Secondary | ICD-10-CM | POA: Diagnosis not present

## 2021-07-27 DIAGNOSIS — J439 Emphysema, unspecified: Secondary | ICD-10-CM | POA: Diagnosis not present

## 2021-07-27 DIAGNOSIS — I700 Atherosclerosis of aorta: Secondary | ICD-10-CM | POA: Diagnosis not present

## 2021-08-24 ENCOUNTER — Telehealth: Payer: Self-pay | Admitting: Cardiology

## 2021-08-24 ENCOUNTER — Other Ambulatory Visit: Payer: Self-pay

## 2021-08-24 MED ORDER — ROSUVASTATIN CALCIUM 40 MG PO TABS: 40.0000 mg | ORAL_TABLET | Freq: Every day | ORAL | 1 refills | Status: AC

## 2021-08-24 NOTE — Telephone Encounter (Signed)
°*  STAT* If patient is at the pharmacy, call can be transferred to refill team.   1. Which medications need to be refilled? (please list name of each medication and dose if known)  rosuvastatin (CRESTOR) 40 MG tablet (Expired)  2. Which pharmacy/location (including street and city if local pharmacy) is medication to be sent to?  [place] - [phone]  3. Do they need a 30 day or 90 day supply? 90 ds

## 2021-09-20 DIAGNOSIS — Z03818 Encounter for observation for suspected exposure to other biological agents ruled out: Secondary | ICD-10-CM | POA: Diagnosis not present

## 2021-09-20 DIAGNOSIS — K921 Melena: Secondary | ICD-10-CM | POA: Diagnosis not present

## 2021-09-20 DIAGNOSIS — R109 Unspecified abdominal pain: Secondary | ICD-10-CM | POA: Diagnosis not present

## 2021-09-20 DIAGNOSIS — J988 Other specified respiratory disorders: Secondary | ICD-10-CM | POA: Diagnosis not present

## 2021-09-20 DIAGNOSIS — M549 Dorsalgia, unspecified: Secondary | ICD-10-CM | POA: Diagnosis not present

## 2021-09-21 ENCOUNTER — Other Ambulatory Visit: Payer: Self-pay

## 2021-09-21 ENCOUNTER — Inpatient Hospital Stay (HOSPITAL_COMMUNITY)
Admission: EM | Admit: 2021-09-21 | Discharge: 2021-09-25 | DRG: 682 | Disposition: A | Discharge status: Home / Self Care | Payer: Medicare Other | Attending: Family Medicine | Admitting: Family Medicine

## 2021-09-21 ENCOUNTER — Emergency Department (HOSPITAL_COMMUNITY): Payer: Medicare Other

## 2021-09-21 ENCOUNTER — Encounter (HOSPITAL_COMMUNITY): Payer: Self-pay

## 2021-09-21 DIAGNOSIS — Z6827 Body mass index (BMI) 27.0-27.9, adult: Secondary | ICD-10-CM | POA: Diagnosis not present

## 2021-09-21 DIAGNOSIS — I10 Essential (primary) hypertension: Secondary | ICD-10-CM | POA: Diagnosis not present

## 2021-09-21 DIAGNOSIS — R040 Epistaxis: Secondary | ICD-10-CM | POA: Diagnosis present

## 2021-09-21 DIAGNOSIS — N400 Enlarged prostate without lower urinary tract symptoms: Secondary | ICD-10-CM | POA: Diagnosis not present

## 2021-09-21 DIAGNOSIS — R918 Other nonspecific abnormal finding of lung field: Secondary | ICD-10-CM | POA: Diagnosis not present

## 2021-09-21 DIAGNOSIS — C7951 Secondary malignant neoplasm of bone: Secondary | ICD-10-CM | POA: Diagnosis present

## 2021-09-21 DIAGNOSIS — G893 Neoplasm related pain (acute) (chronic): Secondary | ICD-10-CM | POA: Diagnosis present

## 2021-09-21 DIAGNOSIS — C787 Secondary malignant neoplasm of liver and intrahepatic bile duct: Secondary | ICD-10-CM | POA: Diagnosis present

## 2021-09-21 DIAGNOSIS — E872 Acidosis, unspecified: Secondary | ICD-10-CM | POA: Diagnosis present

## 2021-09-21 DIAGNOSIS — I129 Hypertensive chronic kidney disease with stage 1 through stage 4 chronic kidney disease, or unspecified chronic kidney disease: Secondary | ICD-10-CM | POA: Diagnosis present

## 2021-09-21 DIAGNOSIS — K219 Gastro-esophageal reflux disease without esophagitis: Secondary | ICD-10-CM | POA: Diagnosis present

## 2021-09-21 DIAGNOSIS — C801 Malignant (primary) neoplasm, unspecified: Secondary | ICD-10-CM | POA: Diagnosis not present

## 2021-09-21 DIAGNOSIS — K921 Melena: Secondary | ICD-10-CM | POA: Diagnosis not present

## 2021-09-21 DIAGNOSIS — Z888 Allergy status to other drugs, medicaments and biological substances status: Secondary | ICD-10-CM | POA: Diagnosis not present

## 2021-09-21 DIAGNOSIS — I700 Atherosclerosis of aorta: Secondary | ICD-10-CM | POA: Diagnosis not present

## 2021-09-21 DIAGNOSIS — N179 Acute kidney failure, unspecified: Secondary | ICD-10-CM | POA: Diagnosis not present

## 2021-09-21 DIAGNOSIS — Z7982 Long term (current) use of aspirin: Secondary | ICD-10-CM

## 2021-09-21 DIAGNOSIS — E440 Moderate protein-calorie malnutrition: Secondary | ICD-10-CM | POA: Diagnosis present

## 2021-09-21 DIAGNOSIS — Z79899 Other long term (current) drug therapy: Secondary | ICD-10-CM

## 2021-09-21 DIAGNOSIS — E785 Hyperlipidemia, unspecified: Secondary | ICD-10-CM | POA: Diagnosis present

## 2021-09-21 DIAGNOSIS — R059 Cough, unspecified: Secondary | ICD-10-CM | POA: Diagnosis not present

## 2021-09-21 DIAGNOSIS — N2889 Other specified disorders of kidney and ureter: Secondary | ICD-10-CM | POA: Diagnosis not present

## 2021-09-21 DIAGNOSIS — Z66 Do not resuscitate: Secondary | ICD-10-CM | POA: Diagnosis present

## 2021-09-21 DIAGNOSIS — Z87891 Personal history of nicotine dependence: Secondary | ICD-10-CM | POA: Diagnosis not present

## 2021-09-21 DIAGNOSIS — Z951 Presence of aortocoronary bypass graft: Secondary | ICD-10-CM

## 2021-09-21 DIAGNOSIS — I2510 Atherosclerotic heart disease of native coronary artery without angina pectoris: Secondary | ICD-10-CM | POA: Diagnosis present

## 2021-09-21 DIAGNOSIS — Z20822 Contact with and (suspected) exposure to covid-19: Secondary | ICD-10-CM | POA: Diagnosis present

## 2021-09-21 DIAGNOSIS — K7689 Other specified diseases of liver: Secondary | ICD-10-CM | POA: Diagnosis not present

## 2021-09-21 DIAGNOSIS — N184 Chronic kidney disease, stage 4 (severe): Secondary | ICD-10-CM | POA: Diagnosis not present

## 2021-09-21 DIAGNOSIS — R7989 Other specified abnormal findings of blood chemistry: Secondary | ICD-10-CM

## 2021-09-21 DIAGNOSIS — J439 Emphysema, unspecified: Secondary | ICD-10-CM

## 2021-09-21 DIAGNOSIS — K5730 Diverticulosis of large intestine without perforation or abscess without bleeding: Secondary | ICD-10-CM | POA: Diagnosis not present

## 2021-09-21 DIAGNOSIS — Z8261 Family history of arthritis: Secondary | ICD-10-CM

## 2021-09-21 DIAGNOSIS — K769 Liver disease, unspecified: Secondary | ICD-10-CM

## 2021-09-21 DIAGNOSIS — E1122 Type 2 diabetes mellitus with diabetic chronic kidney disease: Secondary | ICD-10-CM | POA: Diagnosis present

## 2021-09-21 DIAGNOSIS — R911 Solitary pulmonary nodule: Secondary | ICD-10-CM | POA: Diagnosis not present

## 2021-09-21 DIAGNOSIS — J189 Pneumonia, unspecified organism: Secondary | ICD-10-CM | POA: Diagnosis present

## 2021-09-21 DIAGNOSIS — C3411 Malignant neoplasm of upper lobe, right bronchus or lung: Secondary | ICD-10-CM | POA: Diagnosis present

## 2021-09-21 DIAGNOSIS — K625 Hemorrhage of anus and rectum: Secondary | ICD-10-CM

## 2021-09-21 LAB — CBC WITH DIFFERENTIAL/PLATELET
Abs Immature Granulocytes: 0.05 10*3/uL (ref 0.00–0.07)
Basophils Absolute: 0 10*3/uL (ref 0.0–0.1)
Basophils Relative: 1 %
Eosinophils Absolute: 0.1 10*3/uL (ref 0.0–0.5)
Eosinophils Relative: 1 %
HCT: 40.3 % (ref 39.0–52.0)
Hemoglobin: 13.1 g/dL (ref 13.0–17.0)
Immature Granulocytes: 1 %
Lymphocytes Relative: 14 %
Lymphs Abs: 0.8 10*3/uL (ref 0.7–4.0)
MCH: 28.6 pg (ref 26.0–34.0)
MCHC: 32.5 g/dL (ref 30.0–36.0)
MCV: 88 fL (ref 80.0–100.0)
Monocytes Absolute: 0.5 10*3/uL (ref 0.1–1.0)
Monocytes Relative: 8 %
Neutro Abs: 4.1 10*3/uL (ref 1.7–7.7)
Neutrophils Relative %: 75 %
Platelets: 177 10*3/uL (ref 150–400)
RBC: 4.58 MIL/uL (ref 4.22–5.81)
RDW: 15.2 % (ref 11.5–15.5)
WBC: 5.4 10*3/uL (ref 4.0–10.5)
nRBC: 0 % (ref 0.0–0.2)

## 2021-09-21 LAB — URINALYSIS, ROUTINE W REFLEX MICROSCOPIC
Bilirubin Urine: NEGATIVE
Glucose, UA: NEGATIVE mg/dL
Ketones, ur: NEGATIVE mg/dL
Leukocytes,Ua: NEGATIVE
Nitrite: NEGATIVE
Protein, ur: 100 mg/dL — AB
Specific Gravity, Urine: 1.013 (ref 1.005–1.030)
pH: 5 (ref 5.0–8.0)

## 2021-09-21 LAB — COMPREHENSIVE METABOLIC PANEL
ALT: 130 U/L — ABNORMAL HIGH (ref 0–44)
AST: 218 U/L — ABNORMAL HIGH (ref 15–41)
Albumin: 3.5 g/dL (ref 3.5–5.0)
Alkaline Phosphatase: 344 U/L — ABNORMAL HIGH (ref 38–126)
Anion gap: 15 (ref 5–15)
BUN: 101 mg/dL — ABNORMAL HIGH (ref 8–23)
CO2: 18 mmol/L — ABNORMAL LOW (ref 22–32)
Calcium: 10.1 mg/dL (ref 8.9–10.3)
Chloride: 103 mmol/L (ref 98–111)
Creatinine, Ser: 5.3 mg/dL — ABNORMAL HIGH (ref 0.61–1.24)
GFR, Estimated: 11 mL/min — ABNORMAL LOW (ref >60)
Glucose, Bld: 82 mg/dL (ref 70–99)
Potassium: 4.4 mmol/L (ref 3.5–5.1)
Sodium: 136 mmol/L (ref 135–145)
Total Bilirubin: 0.9 mg/dL (ref 0.3–1.2)
Total Protein: 7.5 g/dL (ref 6.5–8.1)

## 2021-09-21 LAB — PROTIME-INR
INR: 1.1 (ref 0.8–1.2)
Prothrombin Time: 14.3 seconds (ref 11.4–15.2)

## 2021-09-21 LAB — CREATININE, URINE, RANDOM: Creatinine, Urine: 44.97 mg/dL

## 2021-09-21 LAB — POC OCCULT BLOOD, ED: Fecal Occult Bld: NEGATIVE

## 2021-09-21 LAB — RESP PANEL BY RT-PCR (FLU A&B, COVID) ARPGX2
Influenza A by PCR: NEGATIVE
Influenza B by PCR: NEGATIVE
SARS Coronavirus 2 by RT PCR: NEGATIVE

## 2021-09-21 LAB — SODIUM, URINE, RANDOM: Sodium, Ur: 83 mmol/L

## 2021-09-21 MED ORDER — PANTOPRAZOLE SODIUM 40 MG PO TBEC
40.0000 mg | DELAYED_RELEASE_TABLET | Freq: Every day | ORAL | Status: DC
  Administered 2021-09-21 – 2021-09-25 (×5): 40 mg via ORAL
  Filled 2021-09-21 (×5): qty 1

## 2021-09-21 MED ORDER — ONDANSETRON HCL 4 MG/2ML IJ SOLN
4.0000 mg | Freq: Four times a day (QID) | INTRAMUSCULAR | Status: DC | PRN
  Administered 2021-09-22 – 2021-09-24 (×6): 4 mg via INTRAVENOUS
  Filled 2021-09-21 (×6): qty 2

## 2021-09-21 MED ORDER — BISOPROLOL FUMARATE 5 MG PO TABS
10.0000 mg | ORAL_TABLET | Freq: Every day | ORAL | Status: DC
  Administered 2021-09-22 – 2021-09-25 (×4): 10 mg via ORAL
  Filled 2021-09-21 (×4): qty 2

## 2021-09-21 MED ORDER — ACETAMINOPHEN 325 MG PO TABS: 650.0000 mg | ORAL_TABLET | Freq: Four times a day (QID) | ORAL | Status: DC | PRN

## 2021-09-21 MED ORDER — SODIUM CHLORIDE 0.9 % IV BOLUS
1000.0000 mL | Freq: Once | INTRAVENOUS | Status: AC
Start: 2021-09-21 — End: 2021-09-21
  Administered 2021-09-21: 1000 mL via INTRAVENOUS

## 2021-09-21 MED ORDER — SODIUM CHLORIDE 0.9 % IV BOLUS
1000.0000 mL | Freq: Once | INTRAVENOUS | Status: AC
  Administered 2021-09-21: 1000 mL via INTRAVENOUS

## 2021-09-21 MED ORDER — INSULIN DETEMIR 100 UNIT/ML ~~LOC~~ SOLN
15.0000 [IU] | Freq: Two times a day (BID) | SUBCUTANEOUS | Status: DC
  Administered 2021-09-21 – 2021-09-24 (×7): 15 [IU] via SUBCUTANEOUS
  Filled 2021-09-21 (×8): qty 0.15

## 2021-09-21 MED ORDER — ONDANSETRON HCL 4 MG PO TABS: 4.0000 mg | ORAL_TABLET | Freq: Four times a day (QID) | ORAL | Status: DC | PRN

## 2021-09-21 MED ORDER — SODIUM CHLORIDE 0.9 % IV SOLN: INTRAVENOUS | Status: DC

## 2021-09-21 MED ORDER — ACETAMINOPHEN 650 MG RE SUPP: 650.0000 mg | Freq: Four times a day (QID) | RECTAL | Status: DC | PRN

## 2021-09-21 MED ORDER — OXYCODONE HCL 5 MG PO TABS
5.0000 mg | ORAL_TABLET | ORAL | Status: DC | PRN
  Administered 2021-09-21 – 2021-09-22 (×3): 5 mg via ORAL
  Filled 2021-09-21 (×4): qty 1

## 2021-09-21 NOTE — Assessment & Plan Note (Signed)
--  asymptomatic, does not limit at home, does not use meds ?

## 2021-09-21 NOTE — Assessment & Plan Note (Addendum)
--  intermittent, has h/o bleeding hemorrhoids.  Also with intermittent very mild epistaxis, suspect related to uremia.  No evidence of active bleeding.  Follow CBC intermittently. ?

## 2021-09-21 NOTE — ED Provider Notes (Signed)
?[facility] DEPT ?Provider Note ? ? ?CSN: [id number] ?Arrival date & time: 09/21/21  1334 ? ?  ? ?History ? ?No chief complaint on file. ? ? ?Paul Mcneil is a 76 y.o. male hx of HTN, DM, here with renal failure, weight loss, blood in stool.  Patient states that he had about 15 pound weight loss over the last 2 months.  Patient has poor appetite.  Patient also has been having blood in his stool when he wipes.  Patient went to [facility] family physician yesterday for above complaints and had labs drawn and has acute renal failure.  I was able to see the chart at [facility] and the last creatinine was August of last year and the creatinine was 3.  I was unable to see what his creatinine today is. Patient's labs came back today and apparently Cr is more elevated and was sent here for admission and also CT scan to rule out abdominal mass.  Patient also has a cough and tested negative for COVID yesterday.  Patient was started on doxycycline for possible early pneumonia versus bronchitis.  ? ?The history is provided by the patient.  ? ?  ? ?Home Medications ?Prior to Admission medications   ?Medication Sig Start Date End Date Taking? Authorizing Provider  ?aspirin EC 81 MG tablet Take 1 tablet (81 mg total) by mouth 2 (two) times daily. Swallow whole. 06/20/20   Leonie Man, MD  ?bisoprolol (ZEBETA) 10 MG tablet Take 1 tablet (10 mg total) by mouth daily. 02/07/21   Leonie Man, MD  ?cholecalciferol (VITAMIN D) 25 MCG (1000 UNIT) tablet Take 4,000 Units by mouth daily.    [provider]  ?esomeprazole (NEXIUM) 40 MG capsule Take 1 capsule (40 mg total) by mouth daily as needed (for acid reflux). 03/11/17   Ahmed Prima, Fransisco Hertz, PA-C  ?furosemide (LASIX) 40 MG tablet Take 40 mg by mouth daily. 05/17/20   [provider]  ?Lactobacillus Casei-Folic Acid (RESTORA RX) 60-1.25 MG CAPS Take 1 tablet by mouth daily as needed (for digestive health).     [provider]   ?LEVEMIR FLEXTOUCH 100 UNIT/ML Pen Inject 15 Units into the skin 2 (two) times daily.  08/28/17   [provider]  ?Multiple Vitamin (MULTIVITAMIN WITH MINERALS) TABS tablet Take 1 tablet by mouth every other day. Advanced Senior Formula    [provider]  ?omega-3 acid ethyl esters (LOVAZA) 1 g capsule Take 1 capsule (1 g total) by mouth 2 (two) times daily. 10/19/20   Almyra Deforest, [place]  ?rosuvastatin (CRESTOR) 40 MG tablet Take 1 tablet (40 mg total) by mouth daily. 08/24/21 11/22/21  Leonie Man, MD  ?   ? ?Allergies    ?Ceclor [cefaclor]   ? ?Review of Systems   ?Review of Systems  ?Constitutional:  Positive for fatigue and unexpected weight change.  ?Respiratory:  Positive for cough.   ?All other systems reviewed and are negative. ? ?Physical Exam ?Updated Vital Signs ?BP 133/69   Pulse 74   Temp 97.8 ?F (36.6 ?C) (Oral)   Resp 15   Ht 6\' 1"  (1.854 m)   Wt 95.7 kg   SpO2 97%   BMI 27.84 kg/m?  ?Physical Exam ?Vitals and nursing note reviewed.  ?Constitutional:   ?   Comments: Chronically ill, pale  ?HENT:  ?   Head: Normocephalic.  ?   Nose: Nose normal.  ?   Mouth/Throat:  ?   Mouth: Mucous membranes  are dry.  ?Eyes:  ?   Extraocular Movements: Extraocular movements intact.  ?   Pupils: Pupils are equal, round, and reactive to light.  ?Cardiovascular:  ?   Rate and Rhythm: Normal rate and regular rhythm.  ?   Pulses: Normal pulses.  ?   Heart sounds: Normal heart sounds.  ?Pulmonary:  ?   Comments: Slightly tachypneic and diminished bilateral bases ?Abdominal:  ?   Comments: Distended and patient appears to have hepatomegaly versus a mass in the right upper quadrant.  Patient also has some tenderness in the right flank area as well.  No lower abdominal tenderness.  ?Genitourinary: ?   Comments: Patient has small external hemorrhoids with no obvious thrombosis or bleeding ?Musculoskeletal:     ?   General: Normal range of motion.  ?   Cervical back: Normal range of motion and neck supple.   ?Skin: ?   General: Skin is warm.  ?   Capillary Refill: Capillary refill takes less than 2 seconds.  ?Neurological:  ?   General: No focal deficit present.  ?   Mental Status: He is oriented to person, place, and time.  ?Psychiatric:     ?   Mood and Affect: Mood normal.     ?   Behavior: Behavior normal.  ? ? ?ED Results / Procedures / Treatments   ?Labs ?(all labs ordered are listed, but only abnormal results are displayed) ?Labs Reviewed  ?RESP PANEL BY RT-PCR (FLU A&B, COVID) ARPGX2  ?CBC WITH DIFFERENTIAL/PLATELET  ?COMPREHENSIVE METABOLIC PANEL  ?URINALYSIS, ROUTINE W REFLEX MICROSCOPIC  ?PROTIME-INR  ?POC OCCULT BLOOD, ED  ? ? ?EKG ?EKG Interpretation ? ?Date/Time:  Thursday September 21 2021 15:35:26 EST ?Ventricular Rate:  75 ?PR Interval:  173 ?QRS Duration: 118 ?QT Interval:  407 ?QTC Calculation: 455 ?R Axis:   1 ?Text Interpretation: Sinus rhythm Nonspecific intraventricular conduction delay Inferior infarct, old No significant change since last tracing Confirmed by Wandra Arthurs [phone]) on 09/21/2021 3:44:22 PM ? ?Radiology ?No results found. ? ?Procedures ?Procedures  ? ? ?CRITICAL CARE ?Performed by: Wandra Arthurs ? ? ?Total critical care time: 30 minutes ? ?Critical care time was exclusive of separately billable procedures and treating other patients. ? ?Critical care was necessary to treat or prevent imminent or life-threatening deterioration. ? ?Critical care was time spent personally by me on the following activities: development of treatment plan with patient and/or surrogate as well as nursing, discussions with consultants, evaluation of patient's response to treatment, examination of patient, obtaining history from patient or surrogate, ordering and performing treatments and interventions, ordering and review of laboratory studies, ordering and review of radiographic studies, pulse oximetry and re-evaluation of patient's condition. ? ? ?Medications Ordered in ED ?Medications  ?sodium chloride 0.9 %  bolus 1,000 mL (has no administration in time range)  ? ? ?ED Course/ Medical Decision Making/ A&P ?  ?                        ?Medical Decision Making ?NIVEK POWLEY is a 76 y.o. male hx of HTN, DM, here with weight loss, abdominal pain and renal failure. Patient appears to have tenderness in the right upper quadrant.  Concern for possible hepatomegaly versus abdominal mass.  Patient also has acute renal failure.  We will get CT renal stone and repeat chemistry.  Will hydrate patient and likely patient will need admission for further work-up ? ?4:49 PM ?CT today showed hepatic  mets and also lumbar lesion.  I am concerned that he may have metastatic cancer.  His creatinine today is 5.  I discussed case with Dr. Posey Pronto from nephrology.  He states that since patient has metastatic cancer, he would not offer dialysis at this time.  He recommend IV fluids and admission to the hospitalist service at [facility] long and goals of care discussion and possible oncology evaluation.  If his kidney function is not improving with IV fluids then hospitalist can consult nephrology inpatient ? ? 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 ?Amount and/or Complexity of Data Reviewed ?External Data Reviewed: notes. ?Labs: ordered. Decision-making details documented in ED Course. ?Radiology: ordered and independent interpretation performed. Decision-making details documented in ED Course. ?ECG/medicine tests: ordered. ? ?Risk ?Prescription drug management. ?Decision regarding hospitalization. ? ?Final Clinical Impression(s) / ED Diagnoses ?Final diagnoses:  ?None  ? ? ?Rx / DC Orders ?ED Discharge Orders   ? ? None  ? ?  ? ? ?  ?Drenda Freeze, MD ?09/21/21 1657 ? ?

## 2021-09-21 NOTE — Assessment & Plan Note (Signed)
--   Can resume Crestor on discharge ?

## 2021-09-21 NOTE — Assessment & Plan Note (Addendum)
--   stable. Was changed to morphine given intolerance to oxycodone (vomiting) but has not used yet ?

## 2021-09-21 NOTE — ED Triage Notes (Signed)
Patient states he had blood work completed yesterday and was called today and instructed the patient to come to the ED because he had decreased kidney function and for a CT Scan. ?

## 2021-09-21 NOTE — Hospital Course (Addendum)
76 year old man former smoker with 2-week history of fatigue, seen by PCP 48 hours ago, found to have abnormal blood work and sent to the ED for further evaluation.  Work-up revealed acute kidney injury and concern for metastatic cancer.  Treated with IV fluids with gradual clinical improvement. Renal function much improved. Discussed with primary nephrologist Dr. Carolin Sicks, ok to discharge home today, he will arrange f/u in about a week; requests outpt renal function panel from PCP prior to follow-up. Has been referred to oncology for follow-up. ?

## 2021-09-21 NOTE — Assessment & Plan Note (Addendum)
--   Innumerable liver lesions, destructive lytic lesion right L4 transverse process with associated soft tissue mass by CT.  Concerning for metastatic cancer.  CT with right lung mass concerning for bronchogenic carcinoma ?-- Plan for CT-guided biopsy soft tissue mass Monday. ?

## 2021-09-21 NOTE — Progress Notes (Signed)
Pt states he brought all of his medications with him in a pill box. I told him not to take any of the medications that he brought from home. I told him that the doctor will order what medications he wants him to have and the nurses will give him those medications.  ?Pt states he wants to talk to a chaplain and will wait until tomorrow. States he does not need to see them tonight. He wants some help with advance directives and he also states that he found out he has cancer today and will not be living much longer.Order put in computer for spiritual services. ?TDoroteo Bradford BSN, RN-BC ?Admissions Nurse / Discharge Review Nurse ?09/21/2021 ?7:49 PM ? ?

## 2021-09-21 NOTE — Assessment & Plan Note (Signed)
--   Followed by Dr. Carolin Sicks as an outpatient. ?

## 2021-09-21 NOTE — Assessment & Plan Note (Signed)
--   Secondary to metastatic disease to the liver, no further evaluation suggested ?

## 2021-09-21 NOTE — H&P (Signed)
History and Physical    Patient: Paul Mcneil GEX:[id number] DOB: 04/21/1946 DOA: 09/21/2021 DOS: the patient was seen and examined on 09/21/2021 PCP: [name], MD  Patient coming from: Home  Chief Complaint: back pain   HPI:  76 year old man former smoker with 2-week history of fatigue, seen by PCP 48 hours ago, found to have abnormal blood work and sent to the ED for further evaluation.  Work-up revealed acute kidney injury and concern for metastatic cancer.  Patient quit smoking several years ago, was previously a heavy smoker.  Has been doing well, lives in a duplex by himself with his daughter, son-in-law and 2 grandchildren nearby.  Takes care of all his own ADLs.  Approximately 2 weeks ago developed fatigue, "leaden" legs, intermittent nosebleeds when blowing his nose and mid to low paravertebral back pain. Has been urinating fine. No specific aggravating or alleviating factors.  Went to see his primary care physician, blood work drawn, he was called last night and told that his kidney function was worse and recommendation was for him to go to the ED.  Review of Systems: Negative for fever, new visual changes, positive for sore throat, negative for new muscle aches, reports of some rash on his right shoulder, no chest pain, no shortness of breath although he does feel a tight sensation in his chest, some upper right quadrant abdominal pain, no dysuria or difficulty urinating, some bleeding when wiping rectum.  Reports he has chronic intermittent bleeding hemorrhoids.  Otherwise as above.  Past Medical History:  Diagnosis Date   Anemia 1964   Arthritis    "touch starting to show up in the joints" (12/11/2013)   CAD (coronary artery disease), native coronary artery    a. Cath 01/30/17 showing multivessel CAD --> underwent CABG on 02/15/2017 with LIMA-LAD, SVG-PDA, and SVG-Ramus-OM1.    Carotid artery calcification    a. 01/2017: s/p L CEA performed at time of CABG; Existing  Mod-Severe R Carotid stenosis   Chronic kidney disease, stage IV (severe) ([place])    "my kidneys are down to 30% function"; nephrologist-Dr. Carolin Sicks   COPD (chronic obstructive pulmonary disease) ([place])    "they said I had it"   Diabetes ([place])    Dyspnea    with exertion   Former heavy tobacco smoker    GERD (gastroesophageal reflux disease)    Dr. 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   LIMA-LAD, SVG-PDA, SVG-RI-OM 1   Hyperlipemia    Past Surgical History:  Procedure Laterality Date   APPENDECTOMY  12/11/2013   "laparoscopic"   BRONCHOSCOPY     CHOLECYSTECTOMY  ~ 2010   CORONARY ARTERY BYPASS GRAFT N/A 02/15/2017   Procedure: CORONARY ARTERY BYPASS GRAFTING (CABG) x4 :LAD to  LIMA, SVG to PDA , Sequential SVG to OM1 and Ramus;  Surgeon: Melrose Nakayama, MD;  Location: [facility];  Service: Open Heart Surgery;  Laterality: N/A;   ENDARTERECTOMY Left 02/15/2017   Procedure: LEFT ENDARTERECTOMY CAROTID;  Surgeon: Angelia Mould, MD;  Location: [facility];  Service: Vascular;  Laterality: Left;   ENDARTERECTOMY Right 03/04/2018   Procedure: ENDARTERECTOMY CAROTID RIGHT WITH PRIMARY CLOSURE OF ARTERY;  Surgeon: Angelia Mould, MD;  Location: [facility];  Service: Vascular;  Laterality: Right;   LAPAROSCOPIC APPENDECTOMY N/A 12/11/2013   Procedure: APPENDECTOMY LAPAROSCOPIC;  Surgeon: Imogene Burn. Georgette Dover, MD;  Location: [facility];  Service: General;  Laterality: N/A;   RIGHT/LEFT HEART CATH AND  CORONARY ANGIOGRAPHY N/A 01/30/2017   Procedure: Right/Left Heart Cath and Coronary Angiography;  Surgeon: Troy Sine, MD;  Location: [facility] CV LAB;  Service: Cardiovascular: Normal RHC pressures.  Significant multivessel CAD with 50-60% smooth d-LM, 40% mid LAD. 50% mid LAD stenoses; RI --70% ostial & 80% prox RI; 90% pCx w/  80% and 70% distal; 80% oxt-prox & 90% prox-mid -> 100% CTO distal mid RCA w/ L-R collaterals   TEE WITHOUT CARDIOVERSION N/A 02/15/2017    Procedure: TRANSESOPHAGEAL ECHOCARDIOGRAM (TEE);  Surgeon: Melrose Nakayama, MD;  Location: [facility] OR;  Service: Open Heart Surgery: Vigorous wall motion.  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  Mild MR noted.   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  Otherwise normal cavity size.  Mild LVH.  EF 60-65%.  GR 1 DD.  Mild MR.   Social History:  reports that he quit smoking about 5 years ago. His smoking use included cigarettes. He has a 110.00 pack-year smoking history. He has never used smokeless tobacco. He reports that he does not drink alcohol and does not use drugs.  Allergies  Allergen Reactions   Ceclor [Cefaclor] Anaphylaxis, Hives and Other (See Comments)    BASED ON CRITERIA FOR ANAPHYLAXIS HIVES + HYPOTENSION    Family History  Problem Relation Age of Onset   Rheum arthritis Mother    Cancer Father        BONE MARROW    Prior to Admission medications   Medication Sig Start Date End Date Taking? Authorizing Provider  aspirin EC 81 MG tablet Take 1 tablet (81 mg total) by mouth 2 (two) times daily. Swallow whole. 06/20/20   Leonie Man, MD  bisoprolol (ZEBETA) 10 MG tablet Take 1 tablet (10 mg total) by mouth daily. 02/07/21   Leonie Man, MD  cholecalciferol (VITAMIN D) 25 MCG (1000 UNIT) tablet Take 4,000 Units by mouth daily.    [provider]  esomeprazole (NEXIUM) 40 MG capsule Take 1 capsule (40 mg total) by mouth daily as needed (for acid reflux). 03/11/17   Strader, Fransisco Hertz, PA-C  furosemide (LASIX) 40 MG tablet Take 40 mg by mouth daily. 05/17/20   [provider]  Lactobacillus Casei-Folic Acid (RESTORA RX) 60-1.25 MG CAPS Take 1 tablet by mouth daily as needed (for digestive health).     [provider]  LEVEMIR FLEXTOUCH 100 UNIT/ML Pen Inject 15 Units into the skin 2 (two)  times daily.  08/28/17   [provider]  Multiple Vitamin (MULTIVITAMIN WITH MINERALS) TABS tablet Take 1 tablet by mouth every other day. Advanced Senior Formula    [provider]  omega-3 acid ethyl esters (LOVAZA) 1 g capsule Take 1 capsule (1 g total) by mouth 2 (two) times daily. 10/19/20   Almyra Deforest, PA  rosuvastatin (CRESTOR) 40 MG tablet Take 1 tablet (40 mg total) by mouth daily. 08/24/21 11/22/21  Leonie Man, MD    Physical Exam: Vitals:   09/21/21 1700 09/21/21 1800 09/21/21 1830 09/21/21 1900  BP: (!) 143/82 (!) 143/69 (!) 147/73 (!) 160/84  Pulse: 75 77 82 87  Resp: 19 20 16 18   Temp:      TempSrc:      SpO2: 96% 97% 96% 96%  Weight:      Height:       Physical Exam Vitals reviewed.  Constitutional:      General: He is not in acute distress.    Appearance: He is not ill-appearing or toxic-appearing.  HENT:     Head: Normocephalic.     Nose: Nose normal.  Eyes:     Comments: Appear grossly normal  Cardiovascular:     Rate and Rhythm: Normal rate and regular rhythm.     Heart sounds: No murmur heard. Pulmonary:     Effort: Pulmonary effort is normal. No respiratory distress.     Breath sounds: No wheezing, rhonchi or rales.  Abdominal:     General: Abdomen is flat. There is no distension.     Palpations: Abdomen is soft.     Tenderness: There is abdominal tenderness (mild -- RUQ).  Musculoskeletal:     Cervical back: No tenderness.     Right lower leg: No edema.     Left lower leg: No edema.  Neurological:     General: No focal deficit present.     Mental Status: He is alert.  Psychiatric:        Mood and Affect: Mood normal.        Behavior: Behavior normal.     Data Reviewed:  Creatinine 5.30, alkaline phosphatase 344, AST 218, ALT 130, CBC unremarkable CT renal stone study showed innumerable liver lesions concerning for hepatic metastases, destructive lytic lesion right L4 transverse process with associated soft tissue mass,  aortic atherosclerosis and emphysema.  Chest x-ray independently reviewed. With right perihilar and upper lobe airspace opacity.  Concerning for malignancy.  Assessment and Plan: * AKI (acute kidney injury) ([place]) -- Superimposed on CKD stage IV, etiology unclear, no hydronephrosis seen, voiding well.  Prerenal pattern. -- Check urinalysis, strict Is/O, IV fluids.  Potassium within normal limits.  Repeat BMP in AM.  No indication for hemodialysis.  Consider nephrology consultation based on progress.  Liver lesion -- Innumerable liver lesions, destructive lytic lesion right L4 transverse process with associated soft tissue mass by CT.  Concerning for metastatic cancer.  Given abnormal chest x-ray, with smoking history, suspect primary lung cancer. Not a candidate for contrast, will pursue non contrast study tomorrow. -- N.p.o. after midnight, IR consultation tomorrow for consideration of CT-guided biopsy soft tissue mass.  Elevated LFTs -- Secondary to metastatic disease to the liver, no further evaluation suggested  CKD stage IV (HCC) -- Followed by Dr. Carolin Sicks as an outpatient.  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  Also with intermittent very mild epistaxis, suspect related to uremia.  No evidence of active bleeding.  Follow CBC intermittently.  Advance Care Planning: Code status: DNR  Consults: none  Family Communication: none  Severity of Illness: The appropriate patient status for this patient is INPATIENT. Inpatient status is judged to be reasonable and necessary in order to provide the required intensity of service to ensure the patient's safety. The patient's presenting symptoms, physical exam findings, and initial radiographic and  laboratory data in the context of their chronic comorbidities is felt to place them at high risk for further clinical deterioration. Furthermore, it is not anticipated that the patient will be medically stable for discharge from the hospital within 2 midnights of admission.   * I certify that at the point of admission it is my clinical judgment that the patient  will require inpatient hospital care spanning beyond 2 midnights from the point of admission due to high intensity of service, high risk for further deterioration and high frequency of surveillance required.*  Author: Murray Hodgkins, MD 09/21/2021 7:30 PM  For on call review [email or web address].

## 2021-09-21 NOTE — Assessment & Plan Note (Addendum)
--   Superimposed on CKD stage IV, etiology unclear, no hydronephrosis seen.  Prerenal pattern. ?--Urine output unmeasured, emphasize strict I/O, but not recorded ?--Continue bicarbonate infusion for acidosis.  Continue IV fluids.  Creatinine improving gradually, check BMP in AM ?

## 2021-09-21 NOTE — ED Notes (Signed)
Being sent from MD for worsening kidney function, currently started on Doxycycline. ?

## 2021-09-22 LAB — BASIC METABOLIC PANEL
Anion gap: 18 — ABNORMAL HIGH (ref 5–15)
BUN: 96 mg/dL — ABNORMAL HIGH (ref 8–23)
CO2: 15 mmol/L — ABNORMAL LOW (ref 22–32)
Calcium: 9.2 mg/dL (ref 8.9–10.3)
Chloride: 105 mmol/L (ref 98–111)
Creatinine, Ser: 4.8 mg/dL — ABNORMAL HIGH (ref 0.61–1.24)
GFR, Estimated: 12 mL/min — ABNORMAL LOW (ref >60)
Glucose, Bld: 78 mg/dL (ref 70–99)
Potassium: 3.9 mmol/L (ref 3.5–5.1)
Sodium: 138 mmol/L (ref 135–145)

## 2021-09-22 LAB — CBC
HCT: 35.1 % — ABNORMAL LOW (ref 39.0–52.0)
Hemoglobin: 11.2 g/dL — ABNORMAL LOW (ref 13.0–17.0)
MCH: 28.3 pg (ref 26.0–34.0)
MCHC: 31.9 g/dL (ref 30.0–36.0)
MCV: 88.6 fL (ref 80.0–100.0)
Platelets: 140 10*3/uL — ABNORMAL LOW (ref 150–400)
RBC: 3.96 MIL/uL — ABNORMAL LOW (ref 4.22–5.81)
RDW: 15.2 % (ref 11.5–15.5)
WBC: 4.8 10*3/uL (ref 4.0–10.5)
nRBC: 0 % (ref 0.0–0.2)

## 2021-09-22 LAB — GLUCOSE, CAPILLARY: Glucose-Capillary: 105 mg/dL — ABNORMAL HIGH (ref 70–99)

## 2021-09-22 MED ORDER — ADULT MULTIVITAMIN W/MINERALS CH
1.0000 | ORAL_TABLET | Freq: Every day | ORAL | Status: DC
  Administered 2021-09-22 – 2021-09-25 (×4): 1 via ORAL
  Filled 2021-09-22 (×4): qty 1

## 2021-09-22 MED ORDER — ENSURE MAX PROTEIN PO LIQD
11.0000 [oz_av] | Freq: Every day | ORAL | Status: DC
  Administered 2021-09-22 – 2021-09-23 (×2): 11 [oz_av] via ORAL
  Filled 2021-09-22 (×4): qty 330

## 2021-09-22 MED ORDER — STERILE WATER FOR INJECTION IV SOLN
INTRAMUSCULAR | Status: DC
  Filled 2021-09-22: qty 150
  Filled 2021-09-22 (×4): qty 1000
  Filled 2021-09-22: qty 150
  Filled 2021-09-22: qty 1000
  Filled 2021-09-22: qty 150
  Filled 2021-09-22: qty 1000

## 2021-09-22 MED ORDER — BOOST / RESOURCE BREEZE PO LIQD CUSTOM
1.0000 | Freq: Two times a day (BID) | ORAL | Status: DC
  Administered 2021-09-23: 1 via ORAL

## 2021-09-22 NOTE — Progress Notes (Signed)
?  Progress Note ? ? ?Patient: Paul Mcneil MEB:[id number] DOB: [birth year] DOA: 09/21/2021     1 ?DOS: the patient was seen and examined on 09/22/2021 ?  ?Brief hospital course: ?76 year old man former smoker with 2-week history of fatigue, seen by PCP 48 hours ago, found to have abnormal blood work and sent to the ED for further evaluation.  Work-up revealed acute kidney injury and concern for metastatic cancer. ? ?Assessment and Plan: ?* AKI (acute kidney injury) ([place]) ?-- Superimposed on CKD stage IV, etiology unclear, no hydronephrosis seen..  Prerenal pattern. ?--Urine output unmeasured, emphasize strict I/O ?--Change to bicarbonate infusion for acidosis.  Continue IV fluids.  Check BMP. ? ?Liver lesion ?-- Innumerable liver lesions, destructive lytic lesion right L4 transverse process with associated soft tissue mass by CT.  Concerning for metastatic cancer.  Given abnormal chest x-ray, with smoking history, suspect primary lung cancer. Not a candidate for contrast, will pursue non contrast study tomorrow. ?-- Plan for CT-guided biopsy soft tissue mass perhaps Monday. ? ?Elevated LFTs ?-- Secondary to metastatic disease to the liver, no further evaluation suggested ? ?CKD stage IV ([place]) ?-- Followed by Dr. Carolin Sicks as an outpatient. ? 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 ?Emphysema ([place]) ?--asymptomatic, does not limit at home, does not use meds ? ?Aortic atherosclerosis ([place]) ?-- Can resume Crestor on discharge ? ?Rectal bleeding ?--intermittent, has h/o bleeding hemorrhoids.  Also with intermittent very mild epistaxis, suspect related to uremia.  No evidence of active bleeding.  Follow CBC intermittently. ? ? ? ? ? ? ? ?Subjective:  ?Feels better ?Urinating frequently ? ?Physical Exam: ?Vitals:  ? 09/22/21 0440 09/22/21 0800 09/22/21 0825 09/22/21 1350  ?BP: (!) 106/55 122/68 123/68 118/69  ?Pulse: 84 78 77 78  ?Resp: 20 20 20  (!) 21  ?Temp:  98.2 ?F (36.8 ?C) 97.8 ?F (36.6 ?C) 97.8 ?F (36.6 ?C) 97.8 ?F (36.6 ?C)  ?TempSrc: Oral Oral Oral Oral  ?SpO2: 94% 95% 97% 96%  ?Weight:      ?Height:      ? ?Physical Exam ?Constitutional:   ?   General: He is not in acute distress. ?   Appearance: He is not ill-appearing or toxic-appearing.  ?Cardiovascular:  ?   Rate and Rhythm: Normal rate and regular rhythm.  ?   Heart sounds: No murmur heard. ?   Comments: Telemetry SR ?Pulmonary:  ?   Effort: Pulmonary effort is normal. No respiratory distress.  ?   Breath sounds: No wheezing, rhonchi or rales.  ?Musculoskeletal:  ?   Right lower leg: No edema.  ?   Left lower leg: No edema.  ?Neurological:  ?   Mental Status: He is alert.  ?Psychiatric:     ?   Mood and Affect: Mood normal.     ?   Behavior: Behavior normal.  ? ? ? ?Data Reviewed: ? ?Creatinine down to 4.8, BUN now uneril 100 ? ?Family Communication: daughter by telephone ? ?Disposition: ?Status is: Inpatient ?Remains inpatient appropriate because: AKI ? ? ? ? ? ? ? ? ? Planned Discharge Destination: Home ? ? ? ? ?Time spent: 25 minutes ? ?Author: ?Murray Hodgkins, MD ?09/22/2021 4:18 PM ? ?For on call review [email or web address].  ? ?

## 2021-09-22 NOTE — Consult Note (Signed)
Chief Complaint: Hypodense lesions in liver. Request is for liver biopsy  Referring Physician(s): Dr. Allie Bossier  Supervising Physician: Ruthann Cancer  Patient Status: [facility] - In-pt  History of Present Illness: Paul Mcneil is a 76 y.o. male 76 y.o. male inpatient. Former smoker. History of  HLD, GERD, DM, COPD, CAD s/p CABG X 4, alcohol abuse, . Presented to the ED at [facility] on 3.2.23 with abnormal blood work. Patient found to be in AKI with concern for metastatic cancer. CT renal stone from 3.2.23 reads Innumerable hypodensities throughout the liver consistent with diffuse hepatic metastases. Team is requesting liver biopsy for further evaluation of malignancy.  Patient alert and laying in bed, calm.  Endorses RUQ pain. Denies any fevers, headache, chest pain, SOB, cough, nausea, vomiting or bleeding. Return precautions and treatment recommendations and follow-up discussed with the patient who is agreeable with the plan.    Past Medical History:  Diagnosis Date   Anemia 1964   Arthritis    "touch starting to show up in the joints" (12/11/2013)   CAD (coronary artery disease), native coronary artery    a. Cath 01/30/17 showing multivessel CAD --> underwent CABG on 02/15/2017 with LIMA-LAD, SVG-PDA, and SVG-Ramus-OM1.    Carotid artery calcification    a. 01/2017: s/p L CEA performed at time of CABG; Existing Mod-Severe R Carotid stenosis   Chronic kidney disease, stage IV (severe) ([place])    "my kidneys are down to 30% function"; nephrologist-Dr. Carolin Sicks   COPD (chronic obstructive pulmonary disease) ([place])    "they said I had it"   Diabetes ([place])    Dyspnea    with exertion   Former heavy tobacco smoker    GERD (gastroesophageal reflux disease)    Dr. 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   LIMA-LAD, SVG-PDA, SVG-RI-OM 1   Hyperlipemia     Past Surgical History:  Procedure Laterality Date   APPENDECTOMY  12/11/2013   "laparoscopic"   BRONCHOSCOPY      CHOLECYSTECTOMY  ~ 2010   CORONARY ARTERY BYPASS GRAFT N/A 02/15/2017   Procedure: CORONARY ARTERY BYPASS GRAFTING (CABG) x4 :LAD to  LIMA, SVG to PDA , Sequential SVG to OM1 and Ramus;  Surgeon: Melrose Nakayama, MD;  Location: [place];  Service: Open Heart Surgery;  Laterality: N/A;   ENDARTERECTOMY Left 02/15/2017   Procedure: LEFT ENDARTERECTOMY CAROTID;  Surgeon: Angelia Mould, MD;  Location: [place];  Service: Vascular;  Laterality: Left;   ENDARTERECTOMY Right 03/04/2018   Procedure: ENDARTERECTOMY CAROTID RIGHT WITH PRIMARY CLOSURE OF ARTERY;  Surgeon: Angelia Mould, MD;  Location: [place];  Service: Vascular;  Laterality: Right;   LAPAROSCOPIC APPENDECTOMY N/A 12/11/2013   Procedure: APPENDECTOMY LAPAROSCOPIC;  Surgeon: Imogene Burn. Georgette Dover, MD;  Location: [place];  Service: General;  Laterality: N/A;   RIGHT/LEFT HEART CATH AND CORONARY ANGIOGRAPHY N/A 01/30/2017   Procedure: Right/Left Heart Cath and Coronary Angiography;  Surgeon: Troy Sine, MD;  Location: [place] [facility];  Service: Cardiovascular: Normal RHC pressures.  Significant multivessel CAD with 50-60% smooth d-LM, 40% mid LAD. 50% mid LAD stenoses; RI --70% ostial & 80% prox RI; 90% pCx w/  80% and 70% distal; 80% oxt-prox & 90% prox-mid -> 100% CTO distal mid RCA w/ L-R collaterals   TEE WITHOUT CARDIOVERSION N/A 02/15/2017   Procedure: TRANSESOPHAGEAL ECHOCARDIOGRAM (TEE);  Surgeon: Melrose Nakayama, MD;  Location: [place];  Service: Open Heart Surgery:  Vigorous wall motion.  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  Mild MR noted.   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  Otherwise normal cavity size.  Mild LVH.  EF 60-65%.  GR 1 DD.  Mild MR.    Allergies: Ceclor [cefaclor]  Medications: Prior to Admission medications   Medication Sig Start Date  End Date Taking? Authorizing Provider  acetaminophen (TYLENOL) 500 MG tablet Take 1,000 mg by mouth every 6 (six) hours as needed for moderate pain.   Yes [provider]  aspirin EC 81 MG tablet Take 1 tablet (81 mg total) by mouth 2 (two) times daily. Swallow whole. 06/20/20  Yes Leonie Man, MD  bisoprolol (ZEBETA) 10 MG tablet Take 1 tablet (10 mg total) by mouth daily. 02/07/21  Yes Leonie Man, MD  cholecalciferol (VITAMIN D) 25 MCG (1000 UNIT) tablet Take 4,000 Units by mouth daily.   Yes [provider]  doxycycline (VIBRA-TABS) 100 MG tablet Take 100 mg by mouth 2 (two) times daily. 09/20/21  Yes [provider]  esomeprazole (NEXIUM) 40 MG capsule Take 1 capsule (40 mg total) by mouth daily as needed (for acid reflux). 03/11/17  Yes Strader, [place], PA-C  furosemide (LASIX) 40 MG tablet Take 40 mg by mouth daily. 05/17/20  Yes [provider]  Lactobacillus Casei-Folic Acid 79-8.92 MG CAPS Take 1 tablet by mouth daily as needed (for digestive health).    Yes [provider]  LEVEMIR FLEXTOUCH 100 UNIT/ML Pen Inject 10 Units into the skin 2 (two) times daily. 08/28/17  Yes [provider]  Multiple Vitamin (MULTIVITAMIN WITH MINERALS) TABS tablet Take 1 tablet by mouth every other day. Advanced Senior Formula   Yes [provider]  omega-3 acid ethyl esters (LOVAZA) 1 g capsule Take 1 capsule (1 g total) by mouth 2 (two) times daily. Patient taking differently: Take 2 g by mouth daily. 10/19/20  Yes Meng, Hao, PA  OZEMPIC, 0.25 OR 0.5 MG/DOSE, 2 MG/1.5ML SOPN Inject 0.25 mg into the skin once a week. Mondays 07/10/21  Yes [provider]  rosuvastatin (CRESTOR) 40 MG tablet Take 1 tablet (40 mg total) by mouth daily. 08/24/21 11/22/21 Yes Leonie Man, MD     Family History  Problem Relation Age of Onset   Rheum arthritis Mother    Cancer Father        BONE MARROW    Social History   Socioeconomic  History   Marital status: Divorced    Spouse name: Not on file   Number of children: Not on file   Years of education: Not on file   Highest education level: Not on file  Occupational History   Occupation: Engineer, structural    Comment: Retired from [employer], [place]  Tobacco Use   Smoking status: Former    Packs/day: 2.00    Years: 55.00    Pack years: 110.00    Types: Cigarettes    Quit date: 09/21/2016    Years since quitting: 5.0   Smokeless tobacco: Never  Vaping Use   Vaping Use: Never used  Substance and Sexual Activity   Alcohol use: No    Alcohol/week: 20.0 standard drinks    Types: 20 Glasses of wine per week    Comment: quit 1 year ago- in 2018   Drug use: No   Sexual activity:  Never  Other Topics Concern   Not on file  Social History Narrative   He is a former Engineer, 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     He lives by himself, having been divorced. He has only the one daughter and 2 grandchildren.   Does not routinely exercise.      He quit smoking along with alcohol - March 2018   Social Determinants of Health   Financial Resource Strain: Not on file  Food Insecurity: Not on file  Transportation Needs: Not on file  Physical Activity: Not on file  Stress: Not on file  Social Connections: Not on file      Review of Systems: A 12 point ROS discussed and pertinent positives are indicated in the HPI above.  All other systems are negative.  Review of Systems  Constitutional:  Negative for fever.  HENT:  Negative for congestion.   Respiratory:  Negative for cough and shortness of breath.   Cardiovascular:  Negative for chest pain.  Gastrointestinal:  Positive for abdominal pain (RUQ).  Neurological:  Negative for headaches.  Psychiatric/Behavioral:  Negative for behavioral problems and confusion.    Vital Signs: BP 122/68 (BP Location: Left Arm)    Pulse 78    Temp 97.8 F (36.6 C) (Oral)     Resp 20    Ht 6\' 1"  (1.854 m)    Wt 211 lb (95.7 kg)    SpO2 95%    BMI 27.84 kg/m   Physical Exam Vitals and nursing note reviewed.  Constitutional:      Appearance: He is well-developed.  HENT:     Head: Normocephalic.  Cardiovascular:     Rate and Rhythm: Normal rate and regular rhythm.     Heart sounds: Normal heart sounds.  Pulmonary:     Effort: Pulmonary effort is normal.     Breath sounds: Normal breath sounds.  Abdominal:     General: Bowel sounds are normal. Distension: RUQ.     Palpations: Abdomen is soft.     Tenderness: There is abdominal tenderness.  Musculoskeletal:        General: Normal range of motion.     Cervical back: Normal range of motion.  Skin:    General: Skin is dry.  Neurological:     Mental Status: He is alert and oriented to person, place, and time.    Imaging: DG Chest Port 1 View  Result Date: 09/21/2021 CLINICAL DATA:  cough EXAM: PORTABLE CHEST 1 VIEW COMPARISON:  Chest x-ray 03/12/2017, CT chest 01/20/2020 FINDINGS: The heart and mediastinal contours are unchanged. Aortic calcification. Vascular clips overlie the mediastinum. Interval development of right perihilar and upper lobe airspace and interstitial opacities. No pulmonary edema. No pleural effusion. No pneumothorax. No acute osseous abnormality. IMPRESSION: 1. Interval development of right perihilar and upper lobe airspace and interstitial opacities. Recommend CT with intravenous contrast for further evaluation. 2.  Aortic Atherosclerosis (ICD10-I70.0). Electronically Signed   By: Iven Finn M.D.   On: 09/21/2021 15:53   CT Renal Stone Study  Result Date: 09/21/2021 CLINICAL DATA:  Worsening renal function EXAM: CT ABDOMEN AND PELVIS WITHOUT CONTRAST TECHNIQUE: Multidetector CT imaging of the abdomen and pelvis was performed following the standard protocol without IV contrast. Unenhanced CT was performed per clinician order. Lack of IV contrast limits sensitivity and specificity,  especially for evaluation of abdominal/pelvic solid viscera. RADIATION DOSE REDUCTION: This exam was  performed according to the departmental dose-optimization program which includes automated exposure control, adjustment of the mA and/or kV according to patient size and/or use of iterative reconstruction technique. COMPARISON:  12/11/2013 FINDINGS: Lower chest: No acute pleural or parenchymal lung disease. Mild background emphysema. Hepatobiliary: Innumerable hypodensities are seen throughout the liver parenchyma consistent with metastatic disease. Evaluation limited on unenhanced exam. No intrahepatic duct dilation. The gallbladder is surgically absent. Pancreas: Unremarkable unenhanced appearance. Spleen: Unremarkable unenhanced appearance. Adrenals/Urinary Tract: No urinary tract calculi or obstructive uropathy within either kidney. Cortical thinning lower pole right kidney. Bilateral renal hypodensities are identified. 3.4 cm hypodensity within the lower pole right kidney and a 1.5 cm hypodensity within the lower pole left kidney compatible with cyst. Small hypodensities within the upper pole right kidney are too small to characterize accurately. The adrenals are unremarkable. Bladder is minimally distended without focal abnormality. Stomach/Bowel: No bowel obstruction or ileus. Normal appendix right lower quadrant. Scattered diverticulosis of the sigmoid colon without evidence of diverticulitis. No bowel wall thickening or inflammatory change. Vascular/Lymphatic: Aortic atherosclerosis. No enlarged abdominal or pelvic lymph nodes. Reproductive: Mild prostate enlargement measuring 4.6 x 5.7 cm. Other: No free fluid or free gas.  No abdominal wall hernia. Musculoskeletal: There is a destructive lytic lesion within the right L4 transverse process with associated soft tissue component, measuring 2.5 x 3.3 cm reference image 37/2. No other acute or destructive bony lesions. Reconstructed images demonstrate no  additional findings. IMPRESSION: 1. Innumerable hypodensities throughout the liver consistent with diffuse hepatic metastases. 2. Destructive lytic lesion right L4 transverse process with associated soft tissue mass as above, consistent with metastatic disease. This would be the most efficacious location for tissue biopsy if desired. 3. Mild prostate enlargement. 4. No urinary tract calculi or obstructive uropathy. 5. Multiple bilateral renal hypodensities, the largest of which are compatible with cysts. Others within the upper pole right kidney are too small to characterize accurately. 6. Prominent cortical thinning/scarring lower pole right kidney. 7. Aortic Atherosclerosis (ICD10-I70.0) and Emphysema (ICD10-J43.9). Electronically Signed   By: Randa Ngo M.D.   On: 09/21/2021 16:17    Labs:  CBC: Recent Labs    09/21/21 1516 09/22/21 0355  WBC 5.4 4.8  HGB 13.1 11.2*  HCT 40.3 35.1*  PLT 177 140*    COAGS: Recent Labs    09/21/21 1516  INR 1.1    BMP: Recent Labs    09/21/21 1516 09/22/21 0355  NA 136 138  K 4.4 3.9  CL 103 105  CO2 18* 15*  GLUCOSE 82 78  BUN 101* 96*  CALCIUM 10.1 9.2  CREATININE 5.30* 4.80*  GFRNONAA 11* 12*    LIVER FUNCTION TESTS: Recent Labs    09/21/21 1516  BILITOT 0.9  AST 218*  ALT 130*  ALKPHOS 344*  PROT 7.5  ALBUMIN 3.5     Assessment and Plan:  76 y.o. male inpatient. Former smoker. History of  HLD, GERD, DM, COPD, CAD s/p CABG X 4, alcohol abuse, . Presented to the ED at [facility] on 3.2.23 with abnormal blood work. Patient found to be in AKI with concern for metastatic cancer. CT renal stone from 3.2.23 reads Innumerable hypodensities throughout the liver consistent with diffuse hepatic metastases. Team is requesting liver biopsy for further evaluation of malignancy.  BUN 96. Cr 4.8, alkaline phosphatase 344, AST 218, ALT 130. Aspirin 81 mg listed has a home medication. Last dose taken on 3.2.23. No pertinent allergies.   IR  consulted for possible liver biopsy. Case  has been reviewed and procedure approved by Dr. Serafina Royals.  Patient tentatively scheduled for 3.6.23.  Team instructed to: Keep Patient to be NPO after midnight Hold prophylactic anticoagulation 24 hours prior to scheduled procedure. Should anti-coagulation be started prior to scheduled procedure. Team made aware should the patient be discharged prior to scheduled procedure, the procedure can be performed as an outpatient. IR will call patient when ready.  Risks and benefits of liver biopsy was discussed with the patient and/or patient's family including, but not limited to bleeding, infection, damage to adjacent structures or low yield requiring additional tests.  All of the questions were answered and there is agreement to proceed.  Consent signed and in chart.   Thank you for this interesting consult.  I greatly enjoyed meeting LAMEL MCCARLEY and look forward to participating in their care.  A copy of this report was sent to the requesting provider on this date.  Electronically Signed: Jacqualine Mau, NP 09/22/2021, 9:10 AM   I spent a total of 40 Minutes    in face to face in clinical consultation, greater than 50% of which was counseling/coordinating care for liver biopsy

## 2021-09-22 NOTE — Progress Notes (Signed)
Initial Nutrition Assessment ? ?DOCUMENTATION CODES:  ? ?Non-severe (moderate) malnutrition in context of chronic illness ? ?INTERVENTION:  ?- will liberalize diet from Renal to 2 gram Na as serum K WDL and Mg and Phos have not been checked this hospitalization, patient not on dialysis.  ?- will order Boost Breeze BID, each supplement provides 250 kcal and 9 grams of protein ?- will order [employer] with 2% milk BID. ?- will order Ensure Max once/day, each supplement provides 150 kcal and 30 grams of protein. ?- will order 1 tablet Multivitamin with minerals daily. ? ? ?NUTRITION DIAGNOSIS:  ? ?Moderate Malnutrition related to chronic illness as evidenced by moderate fat depletion, moderate muscle depletion, percent weight loss. ? ?GOAL:  ? ?Patient will meet greater than or equal to 90% of their needs ? ?MONITOR:  ? ?PO intake, Supplement acceptance, Labs, Weight trends ? ?REASON FOR ASSESSMENT:  ? ?Malnutrition Screening Tool ? ?ASSESSMENT:  ? ?76 year old male with medical history of anemia, GERD, hx of alcohol abuse, former heavy smoker, CAD, DM, HLD, arthritis, COPD, stage 4 CKD. He presented to the ED after seeing his PCP 48 hours prior d/t 2 weeks of fatigue, heavy-feeling legs, intermittent nosebleeds, and mid to low paravertebral back pain. He was admitted with dx of AKI. ? ?Patient sitting on edge of bed. No visitors present at the time of RD visit. Patient talks frankly about suspected prognosis. He shares that biopsy was originally scheduled for today but is likely to be on Monday.  ? ?He ate 100% of dinner last night and 100% of lunch today. He shares that he vomited ~15 minutes after breakfast this AM and that the same thing occurred after lunch but then he received a second lunch and RN gave him anti-emetic and he was able to keep the meal down. This was not occurring PTA and he feels it was 2/2 pain medication. ? ?Talked with patient about diet liberalization and rationale. He is  agreeable and interested in this change.  ? ?He reports that he has lost 50 lb in the past 4 months. He reports that Cardiologist put him on a diuretic sometime prior to 4 months ago and that when he was losing so much weight he initially thought it was fluid loss.  ? ?Weight yesterday was 211 lb and weight on 06/14/21 was 231 lb which indicates 20 lb weight loss (8.6% body weight) in the past 3.5 months; significant for time frame.  ? ? ?Labs reviewed; BUN: 96 mg/dl, creatinine: 4.8 mg/dl, GFR: 12 ml/min. ? ?Medications reviewed; 15 units levemir BID, 40 mg oral protonix/day.  ?  ? ?NUTRITION - FOCUSED PHYSICAL EXAM: ? ?Flowsheet Row Most Recent Value  ?Orbital Region Moderate depletion  ?Upper Arm Region Moderate depletion  ?Thoracic and Lumbar Region Unable to assess  ?Buccal Region Moderate depletion  ?Temple Region Mild depletion  ?Clavicle Bone Region Moderate depletion  ?Clavicle and Acromion Bone Region Moderate depletion  ?Scapular Bone Region Unable to assess  ?Dorsal Hand Mild depletion  ?Patellar Region Moderate depletion  ?Anterior Thigh Region Moderate depletion  ?Posterior Calf Region Moderate depletion  ?Edema (RD Assessment) None  ?Hair Reviewed  ?Eyes Reviewed  ?Mouth Reviewed  ?Skin Reviewed  ?Nails Reviewed  ? ?  ? ? ?Diet Order:   ?Diet Order   ? ?       ?  Diet 2 gram sodium Room service appropriate? Yes; Fluid consistency: Thin  Diet effective now       ?  ? ?  ?  ? ?  ? ? ?  EDUCATION NEEDS:  ? ?Education needs have been addressed ? ?Skin:  Skin Assessment: Reviewed RN Assessment ? ?Last BM:  PTA/unknown ? ?Height:  ? ?Ht Readings from Last 1 Encounters:  ?09/21/21 6\' 1"  (1.854 m)  ? ? ?Weight:  ? ?Wt Readings from Last 1 Encounters:  ?09/21/21 95.7 kg  ? ? ? ?BMI:  Body mass index is 27.84 kg/m?. ? ?Estimated Nutritional Needs:  ?Kcal:  2200-2400 kcal ?Protein:  110-130 grams ?Fluid:  >/= 2.2 L/day ? ? ? ? ?Jarome Matin, MS, RD, LDN ?Inpatient Clinical Dietitian ?RD pager # available in  [place]  ?After hours/weekend pager # available in [place] ? ?

## 2021-09-23 ENCOUNTER — Inpatient Hospital Stay (HOSPITAL_COMMUNITY): Payer: Medicare Other

## 2021-09-23 DIAGNOSIS — R918 Other nonspecific abnormal finding of lung field: Secondary | ICD-10-CM

## 2021-09-23 DIAGNOSIS — E440 Moderate protein-calorie malnutrition: Secondary | ICD-10-CM

## 2021-09-23 LAB — BASIC METABOLIC PANEL
Anion gap: 16 — ABNORMAL HIGH (ref 5–15)
BUN: 86 mg/dL — ABNORMAL HIGH (ref 8–23)
CO2: 21 mmol/L — ABNORMAL LOW (ref 22–32)
Calcium: 9.1 mg/dL (ref 8.9–10.3)
Chloride: 100 mmol/L (ref 98–111)
Creatinine, Ser: 4.53 mg/dL — ABNORMAL HIGH (ref 0.61–1.24)
GFR, Estimated: 13 mL/min — ABNORMAL LOW (ref >60)
Glucose, Bld: 79 mg/dL (ref 70–99)
Potassium: 3.3 mmol/L — ABNORMAL LOW (ref 3.5–5.1)
Sodium: 137 mmol/L (ref 135–145)

## 2021-09-23 MED ORDER — MORPHINE SULFATE 15 MG PO TABS: 15.0000 mg | ORAL_TABLET | Freq: Four times a day (QID) | ORAL | Status: DC | PRN

## 2021-09-23 MED ORDER — LOPERAMIDE HCL 2 MG PO CAPS
2.0000 mg | ORAL_CAPSULE | Freq: Once | ORAL | Status: AC
  Administered 2021-09-23: 2 mg via ORAL
  Filled 2021-09-23: qty 1

## 2021-09-23 NOTE — Assessment & Plan Note (Addendum)
-   liberalizde diet  ?- Boost Breeze BID, each supplement provides 250 kcal and 9 grams of protein ?- [employer] Instant Breakfast with 2% milk BID. ?- Ensure Max once/day, each supplement provides 150 kcal and 30 grams of protein. ?- 1 tablet Multivitamin with minerals daily. ?

## 2021-09-23 NOTE — Assessment & Plan Note (Signed)
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  Concern for metastatic disease as well as lytic bone metastases. ?--biopsy Monday and close f/u with oncology ?

## 2021-09-23 NOTE — Progress Notes (Signed)
?Progress Note ? ? ?Patient: Paul Mcneil RSW:[id number] DOB: 01-01-1946 DOA: 09/21/2021     2 ?DOS: the patient was seen and examined on 09/23/2021 ?  ?Brief hospital course: ?76 year old man former smoker with 2-week history of fatigue, seen by PCP 48 hours ago, found to have abnormal blood work and sent to the ED for further evaluation.  Work-up revealed acute kidney injury and concern for metastatic cancer. ?--3/4 renal function improving w/ IVF. Continue current Rx, plan for home when further improvement +/- inpatient biopsy ? ?Assessment and Plan: ?* AKI (acute kidney injury) ([facility]) ?-- Superimposed on CKD stage IV, etiology unclear, no hydronephrosis seen..  Prerenal pattern. ?--Urine output unmeasured, emphasize strict I/O, but not recorded ?--Continue bicarbonate infusion for acidosis.  Continue IV fluids.  Creatinine improving gradually, check BMP in AM ? 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  Concern for metastatic disease as well as lytic bone metastases. ?--biopsy Monday and close f/u with oncology ? ?Liver lesion ?-- Innumerable liver lesions, destructive lytic lesion right L4 transverse process with associated soft tissue mass by CT.  Concerning for metastatic cancer.  Given abnormal chest x-ray, with smoking history, suspect primary lung cancer. Not a candidate for contrast, will pursue non contrast study tomorrow. ?-- Plan for CT-guided biopsy soft tissue mass perhaps Monday. ? ?Elevated LFTs ?-- Secondary to metastatic disease to the liver, no further evaluation suggested ? ?CKD stage IV ([place]) ?-- Followed by Dr. Carolin Sicks as an outpatient. ? 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 ?Emphysema ([facility]) ?--asymptomatic, does not  limit at home, does not use meds ? ?Aortic atherosclerosis ([place]) ?-- Can resume Crestor on discharge ? ?Rectal bleeding ?--intermittent, has h/o bleeding hemorrhoids.  Also with intermittent very mild epistaxis, suspect related to uremia.  No evidence of active bleeding.  Follow CBC intermittently. ? ?Malnutrition of moderate degree ?- liberalize diet  ?- Boost Breeze BID, each supplement provides 250 kcal and 9 grams of protein ?- Carnation Instant Breakfast with 2% milk BID. ?- Ensure Max once/day, each supplement provides 150 kcal and 30 grams of protein. ?- 1 tablet Multivitamin with minerals daily. ? ? ? ? ?Subjective:  ?Feels fine ?Urinating frequently ?Oxycodone caused vomiting  ? ?Physical Exam: ?Vitals:  ? 09/22/21 1350 09/22/21 2009 09/23/21 0539 09/23/21 1321  ?BP: 118/69 123/80 137/70 132/71  ?Pulse: 78 81 84 80  ?Resp: (!) 21 18 18 18   ?Temp: 97.8 ?F (36.6 ?C) 97.6 ?F (36.4 ?C) 99.1 ?F (37.3 ?C) 97.7 ?F (36.5 ?C)  ?TempSrc: Oral Oral Oral Oral  ?SpO2: 96% 96% 96% 98%  ?Weight:      ?Height:      ? ?Physical Exam ?Vitals reviewed.  ?Constitutional:   ?   General: He is not in acute distress. ?   Appearance: He is not ill-appearing or toxic-appearing.  ?Cardiovascular:  ?   Rate and Rhythm: Normal rate and regular rhythm.  ?   Heart sounds: No murmur heard. ?Pulmonary:  ?   Effort: Pulmonary effort is normal. No respiratory distress.  ?   Breath sounds: No wheezing, rhonchi or rales.  ?Musculoskeletal:  ?   Right lower leg: No edema.  ?   Left lower leg: No edema.  ?Neurological:  ?   Mental Status: He is alert.  ?Psychiatric:     ?  Mood and Affect: Mood normal.     ?   Behavior: Behavior normal.  ? ? ? ?Data Reviewed: ? ?UOP unrecorded ?K+ 3.3 ?Creatinine down to 4.53 ?CT shows right lung mass, postobstructive pneumonitis, lymphangitic tumor spread ? ?Family Communication: daughter by telephone ? ?Disposition: ?Status is: Inpatient ?Remains inpatient appropriate because: AKI ? ? ? ? ? ? ? ? ? Planned  Discharge Destination: Home ? ? ? ? ?Time spent: 35 minutes ? ?Author: ?Murray Hodgkins, MD ?09/23/2021 2:46 PM ? ?For on call review [email or web address]  ? ?

## 2021-09-23 NOTE — Plan of Care (Signed)
  Problem: Education: Goal: Knowledge of General Education information will improve Description: Including pain rating scale, medication(s)/side effects and non-pharmacologic comfort measures Outcome: Progressing   Problem: Clinical Measurements: Goal: Will remain free from infection Outcome: Progressing   

## 2021-09-24 LAB — BASIC METABOLIC PANEL
Anion gap: 17 — ABNORMAL HIGH (ref 5–15)
BUN: 69 mg/dL — ABNORMAL HIGH (ref 8–23)
CO2: 23 mmol/L (ref 22–32)
Calcium: 9.1 mg/dL (ref 8.9–10.3)
Chloride: 98 mmol/L (ref 98–111)
Creatinine, Ser: 4.11 mg/dL — ABNORMAL HIGH (ref 0.61–1.24)
GFR, Estimated: 14 mL/min — ABNORMAL LOW (ref >60)
Glucose, Bld: 122 mg/dL — ABNORMAL HIGH (ref 70–99)
Potassium: 2.9 mmol/L — ABNORMAL LOW (ref 3.5–5.1)
Sodium: 138 mmol/L (ref 135–145)

## 2021-09-24 MED ORDER — POTASSIUM CHLORIDE 10 MEQ/100ML IV SOLN
10.0000 meq | INTRAVENOUS | Status: DC
  Administered 2021-09-24 (×2): 10 meq via INTRAVENOUS
  Filled 2021-09-24 (×2): qty 100

## 2021-09-24 MED ORDER — LOPERAMIDE HCL 2 MG PO CAPS
2.0000 mg | ORAL_CAPSULE | ORAL | Status: DC | PRN
  Administered 2021-09-24 (×2): 2 mg via ORAL
  Filled 2021-09-24 (×2): qty 1

## 2021-09-24 MED ORDER — POTASSIUM CHLORIDE CRYS ER 20 MEQ PO TBCR
20.0000 meq | EXTENDED_RELEASE_TABLET | Freq: Once | ORAL | Status: AC
  Administered 2021-09-24: 20 meq via ORAL
  Filled 2021-09-24: qty 1

## 2021-09-24 NOTE — Progress Notes (Signed)
Pt potassium level 2.9 this am from lab. NP on-call notified and new orders received. Will continue to closely monitor pt. P.Amo Asmara Backs RN ?

## 2021-09-24 NOTE — Progress Notes (Signed)
?Progress Note ? ? ?Patient: Paul Mcneil OMV:[id number] DOB: [birth year] DOA: 09/21/2021     3 ?DOS: the patient was seen and examined on 09/24/2021 ?  ?Brief hospital course: ?76 year old man former smoker with 2-week history of fatigue, seen by PCP 48 hours ago, found to have abnormal blood work and sent to the ED for further evaluation.  Work-up revealed acute kidney injury and concern for metastatic cancer.  Treated with IV fluids with gradual clinical improvement. ?--3/5 renal function improving w/ IVF. Continue current Rx, plan for biopsy tomorrow, hopefully home next 48 hours if renal function continues to improve. ? ?Assessment and Plan: ?* AKI (acute kidney injury) ([place]) ?-- Superimposed on CKD stage IV, etiology unclear, no hydronephrosis seen.  Prerenal pattern. ?--Urine output unmeasured, emphasize strict I/O, but not recorded ?--Continue bicarbonate infusion for acidosis.  Continue IV fluids.  Creatinine improving gradually, check BMP in AM ? 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  Concern for metastatic disease as well as lytic bone metastases. ?--biopsy Monday and close f/u with oncology ? ?Liver lesion ?-- Innumerable liver lesions, destructive lytic lesion right L4 transverse process with associated soft tissue mass by CT.  Concerning for metastatic cancer.  CT with right lung mass concerning for bronchogenic carcinoma ?-- Plan for CT-guided biopsy soft tissue mass Monday. ? ?Elevated LFTs ?-- Secondary to metastatic disease to the liver, no further evaluation suggested ? ?CKD stage IV ([place]) ?-- Followed by Dr. Carolin Sicks as an outpatient. ? 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 Was changed to morphine given intolerance to oxycodone (vomiting) but has not used yet ? ?Emphysema ([place]) ?--asymptomatic,  does not limit at home, does not use meds ? ?Aortic atherosclerosis ([place]) ?-- Can resume Crestor on discharge ? ?Rectal bleeding-resolved as of 09/24/2021 ?--intermittent, has h/o bleeding hemorrhoids.  Also with intermittent very mild epistaxis, suspect related to uremia.  No evidence of active bleeding.  Follow CBC intermittently. ? ?Malnutrition of moderate degree ?- liberalizde diet  ?- Boost Breeze BID, each supplement provides 250 kcal and 9 grams of protein ?- Carnation Instant Breakfast with 2% milk BID. ?- Ensure Max once/day, each supplement provides 150 kcal and 30 grams of protein. ?- 1 tablet Multivitamin with minerals daily. ? ? ? ? ? ? ? ?Subjective:  ?Feels fine except some nausea, but eating ?Voiding frequently ? ?Physical Exam: ?Vitals:  ? 09/23/21 1321 09/23/21 2011 09/24/21 0503 09/24/21 1255  ?BP: 132/71 138/73 137/67 (!) 143/75  ?Pulse: 80 82 82 75  ?Resp: 18 18 18 20   ?Temp: 97.7 ?F (36.5 ?C) 98.3 ?F (36.8 ?C) 98.2 ?F (36.8 ?C) 98.4 ?F (36.9 ?C)  ?TempSrc: Oral Oral Oral Oral  ?SpO2: 98% 97% 96% 96%  ?Weight:      ?Height:      ? ?Physical Exam ?Vitals reviewed.  ?Constitutional:   ?   General: Paul Mcneil is not in acute distress. ?   Appearance: Paul Mcneil is not ill-appearing or toxic-appearing.  ?Cardiovascular:  ?   Rate and Rhythm: Normal rate and regular rhythm.  ?   Heart sounds: No murmur heard. ?Pulmonary:  ?   Effort: Pulmonary effort is normal. No respiratory distress.  ?   Breath sounds: No wheezing, rhonchi or rales.  ?Neurological:  ?   Mental Status: Paul Mcneil is alert.  ?Psychiatric:     ?   Mood and  Affect: Mood normal.     ?   Behavior: Behavior normal.  ? ? ? ?Data Reviewed: ? ?Voids not tracked ?K+ 2.9 ?Creatinine down to 4.11 ?BUN down to 69 ? ?Family Communication: updated daughter by telephone ? ?Disposition: ?Status is: Inpatient ?Remains inpatient appropriate because: AKI ? ? ? ? ? ? ? ? ? Planned Discharge Destination: Home ? ? ? ? ?Time spent: 20 minutes ? ?Author: ?Murray Hodgkins,  MD ?09/24/2021 1:41 PM ? ?For on call review [email or web address]  ? ?

## 2021-09-24 NOTE — Progress Notes (Signed)
Chaplain engaged in an initial visit with Paul Mcneil.  Christ right away expressed that he was dying of cancer.  When Chaplain asked how he was internalizing that information he voiced that he was still in a space of processing, but had already gone through various emotions.  Chaplain could assess that he was in a state of shock.  Paul Mcneil voiced that he had no indication that he was "sick" on the level that he is.   ? ?Paul Mcneil also spent a significant amount of time expressing his worry over what will happen to his daughter, her husband, and two grandchildren when he dies.  They currently live with him and he expressed not knowing how they would financially cover the mortgage and living expenses without him. Paul Mcneil is deeply thinking about the future of his family and what they will do.  While he has tried to have some conversations with them he voiced that they haven't accepted what is happening.   ? ?Chaplain suggested that he speak with Social Work regarding some possible resources to assist his family when he passes.  Paul Mcneil is forward thinking and wants to set some things in order for them and himself before dying.    ? ?Paul Mcneil has already began to think about his "bucket list" meal before he dies.  Chaplain affirmed Paul Mcneil doing whatever he loves with the time he has left.  ? ?Chaplain offered therapeutic listening, compassionate presence and support, and prayer with him.   ? ? ? 09/24/21 1600  ?Clinical Encounter Type  ?Visited With Patient  ?Visit Type Initial;Spiritual support  ?Spiritual Encounters  ?Spiritual Needs Prayer  ?Stress Factors  ?Patient Stress Factors Major life changes;Financial concerns;Family relationships;Health changes  ? ? ?

## 2021-09-25 ENCOUNTER — Inpatient Hospital Stay (HOSPITAL_COMMUNITY): Payer: Medicare Other

## 2021-09-25 DIAGNOSIS — K769 Liver disease, unspecified: Secondary | ICD-10-CM

## 2021-09-25 LAB — BASIC METABOLIC PANEL
Anion gap: 16 — ABNORMAL HIGH (ref 5–15)
BUN: 62 mg/dL — ABNORMAL HIGH (ref 8–23)
CO2: 28 mmol/L (ref 22–32)
Calcium: 8.8 mg/dL — ABNORMAL LOW (ref 8.9–10.3)
Chloride: 97 mmol/L — ABNORMAL LOW (ref 98–111)
Creatinine, Ser: 3.86 mg/dL — ABNORMAL HIGH (ref 0.61–1.24)
GFR, Estimated: 16 mL/min — ABNORMAL LOW (ref >60)
Glucose, Bld: 56 mg/dL — ABNORMAL LOW (ref 70–99)
Potassium: 3.3 mmol/L — ABNORMAL LOW (ref 3.5–5.1)
Sodium: 141 mmol/L (ref 135–145)

## 2021-09-25 LAB — CBC
HCT: 32.2 % — ABNORMAL LOW (ref 39.0–52.0)
Hemoglobin: 10.7 g/dL — ABNORMAL LOW (ref 13.0–17.0)
MCH: 28.9 pg (ref 26.0–34.0)
MCHC: 33.2 g/dL (ref 30.0–36.0)
MCV: 87 fL (ref 80.0–100.0)
Platelets: 141 10*3/uL — ABNORMAL LOW (ref 150–400)
RBC: 3.7 MIL/uL — ABNORMAL LOW (ref 4.22–5.81)
RDW: 15.7 % — ABNORMAL HIGH (ref 11.5–15.5)
WBC: 4.7 10*3/uL (ref 4.0–10.5)
nRBC: 0 % (ref 0.0–0.2)

## 2021-09-25 LAB — PROTIME-INR
INR: 1.1 (ref 0.8–1.2)
Prothrombin Time: 14.4 seconds (ref 11.4–15.2)

## 2021-09-25 LAB — GLUCOSE, CAPILLARY
Glucose-Capillary: 58 mg/dL — ABNORMAL LOW (ref 70–99)
Glucose-Capillary: 104 mg/dL — ABNORMAL HIGH (ref 70–99)

## 2021-09-25 MED ORDER — FENTANYL CITRATE (PF) 100 MCG/2ML IJ SOLN
INTRAMUSCULAR | Status: AC | PRN
Start: 2021-09-25 — End: 2021-09-25
  Administered 2021-09-25 (×2): 50 ug via INTRAVENOUS

## 2021-09-25 MED ORDER — LIDOCAINE HCL 1 % IJ SOLN
INTRAMUSCULAR | Status: AC
  Administered 2021-09-25: 10 mL
  Filled 2021-09-25: qty 20

## 2021-09-25 MED ORDER — POTASSIUM CHLORIDE 10 MEQ/100ML IV SOLN
10.0000 meq | INTRAVENOUS | Status: AC
  Administered 2021-09-25 (×4): 10 meq via INTRAVENOUS
  Filled 2021-09-25 (×4): qty 100

## 2021-09-25 MED ORDER — ENSURE MAX PROTEIN PO LIQD: 11.0000 [oz_av] | Freq: Every day | ORAL | Status: AC

## 2021-09-25 MED ORDER — MIDAZOLAM HCL 2 MG/2ML IJ SOLN
INTRAMUSCULAR | Status: AC
  Filled 2021-09-25: qty 4

## 2021-09-25 MED ORDER — HYDROMORPHONE HCL 2 MG PO TABS: 2.0000 mg | ORAL_TABLET | Freq: Two times a day (BID) | ORAL | 0 refills | Status: AC | PRN

## 2021-09-25 MED ORDER — GELATIN ABSORBABLE 12-7 MM EX MISC
CUTANEOUS | Status: AC
  Administered 2021-09-25: 1
  Filled 2021-09-25: qty 1

## 2021-09-25 MED ORDER — ONDANSETRON HCL 4 MG PO TABS: 4.0000 mg | ORAL_TABLET | Freq: Three times a day (TID) | ORAL | 1 refills | Status: AC | PRN

## 2021-09-25 MED ORDER — HYDROMORPHONE HCL 2 MG PO TABS: 2.0000 mg | ORAL_TABLET | Freq: Two times a day (BID) | ORAL | 0 refills | Status: DC | PRN

## 2021-09-25 MED ORDER — ASPIRIN EC 81 MG PO TBEC: 81.0000 mg | DELAYED_RELEASE_TABLET | Freq: Every day | ORAL | Status: AC

## 2021-09-25 MED ORDER — ONDANSETRON HCL 4 MG PO TABS: 4.0000 mg | ORAL_TABLET | Freq: Three times a day (TID) | ORAL | 1 refills | Status: DC | PRN

## 2021-09-25 MED ORDER — DEXTROSE 50 % IV SOLN
50.0000 mL | Freq: Once | INTRAVENOUS | Status: AC
  Administered 2021-09-25: 50 mL via INTRAVENOUS
  Filled 2021-09-25: qty 50

## 2021-09-25 MED ORDER — MIDAZOLAM HCL 2 MG/2ML IJ SOLN
INTRAMUSCULAR | Status: AC | PRN
  Administered 2021-09-25 (×2): 1 mg via INTRAVENOUS

## 2021-09-25 MED ORDER — FENTANYL CITRATE (PF) 100 MCG/2ML IJ SOLN
INTRAMUSCULAR | Status: AC
  Filled 2021-09-25: qty 2

## 2021-09-25 NOTE — Procedures (Signed)
Vascular and Interventional Radiology Procedure Note ? ?Patient: Paul Mcneil ?DOB: 04-28-1946 ?Medical Record Number: [id number] ?Note Date/Time: 09/25/21 3:20 PM  ? ?Performing Physician: Michaelle Birks, MD ?Assistant(s): None ? ?Diagnosis: R lung mass w liver lesions. ? ?Procedure: LIVER MASS BIOPSY ? ?Anesthesia: Conscious Sedation ?Complications: None ?Estimated Blood Loss: Minimal ?Specimens: Sent for Pathology ? ?Findings:  ?Successful Ultrasound-guided biopsy of liver mass. ?A total of 3 samples were obtained. ?Hemostasis of the tract was achieved using Gelfoam Slurry Embolization. ? ?Plan: Bed rest for 2 hours. ? ?See detailed procedure note with images in PACS. ?The patient tolerated the procedure well without incident or complication and was returned to Floor Bed in stable condition.  ? ? ?Michaelle Birks, MD ?[facility] ?[facility] ? ? ?Pager. [phone] ?Clinic. [phone] ? ?

## 2021-09-25 NOTE — Progress Notes (Signed)
SATURATION QUALIFICATIONS: (This note is used to comply with regulatory documentation for home oxygen) ? ?Patient Saturations on Room Air at Rest = 94% ? ?Patient Saturations on Room Air while Ambulating = 90% ? ?Patient Saturations on 1 Liters of oxygen while Ambulating = 95% ? ?Please briefly explain why patient needs home oxygen: Pt appears to not need oxygen at this time. MD and CM informed of findings. ?

## 2021-09-25 NOTE — Discharge Summary (Signed)
Physician Discharge Summary   Patient: Paul Mcneil MRN: [id number] DOB: May 01, 1946  Admit date:     09/21/2021  Discharge date: 09/25/21  Discharge Physician: Murray Hodgkins   PCP: [name], MD   Recommendations at discharge:    Follow-up AKI, Dr. Dorthy Cooler, please obtain outpatient renal function panel in 1 week, prior to follow-up with Dr. Louie Boston office. Follow-up biopsy, worrisome for metastatic lung cancer, referral to oncology placed Recommend evaluation by Osborne Oman, NP at [facility] for symptom management, see below Follow-up lung mass, bronchus narrowing and asymptomatic postobstructive pneumonitis, recommendations per oncology.  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  Work-up revealed acute kidney injury and concern for metastatic cancer.  Treated with IV fluids with gradual clinical improvement. Renal function much improved. Discussed with primary nephrologist Dr. Carolin Sicks, ok to discharge home today, he will arrange f/u in about a week; requests outpt renal function panel from PCP prior to follow-up. Has been referred to oncology for follow-up.  * AKI (acute kidney injury) ([place]) -- Superimposed on CKD stage IV, etiology unclear, no hydronephrosis seen.  Prerenal pattern. --continues to improve w/ IVF, creatinine trending down, bvaselin from 06/2021 BUN 47, creatinine 2.55. follow-up with nephrology as above. Hold Lasix. Encourage fluid intake.  Mass of right  lung --presumed primary bronchogenic carcinoma, CT showed perihilar right lung mass, narrowing right upper lobe bronchus, postobstructive pneumonitis, concern for lymphangitic spread of tumor.  Concern for metastatic disease as well as lytic bone metastases. --biopsy 3/6 and close f/u with oncology --discussed informally with pulmonology, no need for pulm intervention at this time  Liver lesion -- Innumerable liver lesions, destructive lytic lesion right L4 transverse process with associated soft tissue mass by CT.  Concerning for metastatic cancer.  CT with right lung mass concerning for bronchogenic carcinoma --  follow-up CT-guided biopsy soft tissue mass.  Elevated LFTs -- Secondary to metastatic disease to the liver, no further evaluation suggested  CKD stage IV (HCC) -- Followed by Dr. Carolin Sicks as an outpatient.  Pain due to malignant neoplasm metastatic to bone [facility]) -- stable for now. Intolerance to oxycodone (vomiting) and has not required alternative narcotic inpatient. D/w palliative care, given renal function, recommended low dose oral Dilaudid and follow-up at [facility]. Could consider methadone.  Emphysema ([place]) --asymptomatic, does not limit at home, does not use meds  Aortic atherosclerosis ([place]) -- Can resume Crestor on discharge  Rectal bleeding-resolved as of 09/24/2021 --intermittent, has h/o bleeding hemorrhoids.  Also with intermittent very mild epistaxis, suspect related to uremia.  No evidence of active bleeding.     Malnutrition of moderate degree - liberalizde diet  - Boost Breeze BID, each supplement provides 250 kcal and 9 grams of protein - [employer] Breakfast with 2% milk BID. - Ensure Max once/day, each supplement provides 150 kcal and 30 grams of protein. - 1 tablet Multivitamin with minerals daily.        Pain control - [facility] Controlled  Substance Reporting System database was reviewed. and patient was instructed, not to drive,  operate heavy machinery. Consultants: IR Procedures performed: LIVER MASS BIOPSY Disposition: Home Diet recommendation:  Regular diet DISCHARGE MEDICATION: Allergies as of 09/25/2021       Reactions   Ceclor [cefaclor] Anaphylaxis, Hives, Other (See Comments)   BASED ON CRITERIA FOR ANAPHYLAXIS HIVES + HYPOTENSION        Medication List     STOP taking these medications    doxycycline 100 MG tablet Commonly known as: VIBRA-TABS   furosemide 40 MG tablet Commonly known as: LASIX       TAKE these medications    acetaminophen 500 MG tablet Commonly known as: TYLENOL Take 1,000 mg by mouth every 6 (six) hours as needed for moderate pain.   aspirin EC 81 MG tablet Take 1 tablet (81 mg total) by mouth daily. Swallow whole. Start 3/9. What changed:  when to take this additional instructions   bisoprolol 10 MG tablet Commonly known as: ZEBETA Take 1 tablet (10 mg total) by mouth daily.   cholecalciferol 25 MCG (1000 UNIT) tablet Commonly known as: VITAMIN D Take 4,000 Units by mouth daily.   Ensure Max Protein Liqd Take 330 mLs (11 oz total) by mouth daily.   esomeprazole 40 MG capsule Commonly known as: NEXIUM Take 1 capsule (40 mg total) by mouth daily as needed (for acid reflux).   HYDROmorphone 2 MG tablet Commonly known as: Dilaudid Take 1 tablet (2 mg total) by mouth every 12 (twelve) hours as needed for up to 5 days for severe pain.   Lactobacillus Casei-Folic Acid 24-2.35 MG Caps Take 1 tablet by mouth daily as needed (for digestive health).   Levemir FlexTouch 100 UNIT/ML FlexPen Generic drug: insulin detemir Inject 10 Units into the skin 2 (two) times daily.   multivitamin with minerals Tabs tablet Take 1 tablet by mouth every other day. Advanced Senior Formula   omega-3 acid ethyl esters 1 g capsule Commonly known as: LOVAZA Take 1 capsule (1 g total) by mouth 2 (two) times daily. What changed:  how much to take when to take this    ondansetron 4 MG tablet Commonly known as: Zofran Take 1 tablet (4 mg total) by mouth every 8 (eight) hours as needed for nausea or vomiting.   Ozempic (0.25 or 0.5 MG/DOSE) 2 MG/1.5ML Sopn Generic drug: Semaglutide(0.25 or 0.5MG /DOS) Inject 0.25 mg into the skin once a week. Mondays   rosuvastatin 40 MG tablet Commonly known as: CRESTOR Take 1 tablet (40 mg total) by mouth daily.        Follow-up Information     Rosita Fire, MD Follow up.   Specialties: Nephrology, Internal Medicine Why: Office will call you with an appointment to follow up your kidneys. Contact information: [place] [address] [phone]         [name], MD. Schedule an appointment as soon as possible for a visit in 1 week(s).   Specialty: Family Medicine Why: Need repeat kidney function test Contact information: [place] [address] [place] Follow up.   Why: Office will contact you with an appointment               Feels fine  Discharge Exam: Filed Weights   09/21/21 1339  Weight: 95.7 kg   Physical Exam Constitutional:      General: He is  not in acute distress.    Appearance: He is not ill-appearing or toxic-appearing.  Cardiovascular:     Rate and Rhythm: Normal rate and regular rhythm.     Heart sounds: No murmur heard. Pulmonary:     Effort: Pulmonary effort is normal. No respiratory distress.     Breath sounds: No wheezing, rhonchi or rales.  Musculoskeletal:     Right lower leg: No edema.     Left lower leg: No edema.  Neurological:     Mental Status: He is alert.  Psychiatric:        Mood and Affect: Mood normal.        Behavior: Behavior normal.     Condition at discharge: good  The results of significant diagnostics from this hospitalization (including imaging, microbiology, ancillary and laboratory) are listed below for reference.   Imaging  Studies: CT CHEST WO CONTRAST  Result Date: 09/23/2021 CLINICAL DATA:  Evaluate for primary lung neoplasm. Abnormal CT of the abdomen pelvis. EXAM: CT CHEST WITHOUT CONTRAST TECHNIQUE: Multidetector CT imaging of the chest was performed following the standard protocol without IV contrast. RADIATION DOSE REDUCTION: This exam was performed according to the departmental dose-optimization program which includes automated exposure control, adjustment of the mA and/or kV according to patient size and/or use of iterative reconstruction technique. COMPARISON:  01/20/2020 FINDINGS: Cardiovascular: Previous median sternotomy and CABG procedure. Heart size appears within normal limits. No pericardial effusion identified. Aortic atherosclerosis. Mediastinum/Nodes: Thyroid gland, trachea, and esophagus demonstrate no significant findings. Right paratracheal adenopathy identified. Index right paratracheal lymph node measures 2.2 cm, image 62/3. Assessment of the hilar lymph nodes is limited reflecting lack of IV contrast material. Lungs/Pleura: No pleural effusion. Moderate paraseptal and centrilobular emphysema. Infiltrative perihilar right lung mass is identified measuring 4.4 x 4.0 cm, image 66/3. There is right hilar invasion with encasement and narrowing of the right upper lobe bronchus. Postobstructive pneumonitis is I had scratch set there are postobstructive changes within the right upper lobe which extend towards the right apex. Interlobular septal scratch set irregular interlobular septal thickening throughout the right upper lobe is worrisome for lymphangitic spread of disease. Stable cystic nodule within the left upper lobe measuring 0.9 cm, image 52/6. Solid nodule within the posterior left lower lobe is unchanged from previous exam measuring 0.9 cm, image 106/6. Upper Abdomen: Limited imaging through the upper abdomen shows numerous ill-defined low-attenuation lesions throughout both lobes of liver concerning for  liver metastases. No acute findings identified. Musculoskeletal: Lucent lesion within the posterior aspect of the T4 vertebral body is concerning for lytic bone metastases. There is also a small lucent lesion within the inferior aspect of T9, image 94/8. Small lucent lesion within the inferior aspect of the left scapula measures 7 mm, image 71/6. Lucent lesion involving the posterior aspect of the left twelfth rib is noted, image 151/6. Inferior endplate of W26 lesion is also noted, image 94/8. IMPRESSION: 1. Infiltrative perihilar right lung mass is identified with right hilar invasion and narrowing of the right upper lobe bronchus. Findings are worrisome for primary bronchogenic carcinoma. 2. There is postobstructive pneumonitis within the right upper lobe and signs of lymphangitic spread of tumor within the right upper lobe interstitium. 3. Right paratracheal adenopathy compatible with metastatic adenopathy. 4. Multiple ill-defined low-attenuation lesions throughout both lobes of liver concerning for liver metastases. 5. Multifocal lucent bone lesions compatible with lytic bone metastases. 6. Aortic Atherosclerosis (ICD10-I70.0) and Emphysema (ICD10-J43.9). Electronically Signed   By: Queen Slough.D.  On: 09/23/2021 12:10   US BIOPSY (LIVER)  Result Date: 09/25/2021 INDICATION: Lung mass and multifocal liver lesions. EXAM: ULTRASOUND GUIDED LIVER LESION BIOPSY COMPARISON:  CT AP, 09/21/2021.  CT chest, 09/23/2021. MEDICATIONS: None ANESTHESIA/SEDATION: Fentanyl 100 mcg IV; Versed 2 mg IV Total Moderate Sedation time:  16 minutes. The patient's level of consciousness and vital signs were monitored continuously by radiology nursing throughout the procedure under my direct supervision. COMPLICATIONS: None immediate. PROCEDURE: Informed written consent was obtained from the patient and/or patient's representative after a discussion of the risks, benefits and alternatives to treatment. The patient understands  and consents the procedure. A timeout was performed prior to the initiation of the procedure. Ultrasound scanning was performed of the right upper abdominal quadrant demonstrates rounded, multifocal liver lesions within the RIGHT hepatic lobe. A dominant RIGHT hepatic lobe mass was selected for biopsy and the procedure was planned. The right upper abdominal quadrant was prepped and draped in the usual sterile fashion. The overlying soft tissues were anesthetized with 1% lidocaine with epinephrine. A 17 gauge, 6.8 cm co-axial needle was advanced into a peripheral aspect of the lesion. This was followed by 3 core biopsies with an 18 gauge core device under direct ultrasound guidance. The coaxial needle tract was embolized with a small amount of Gel-Foam slurry and superficial hemostasis was obtained with manual compression. Post procedural scanning was negative for definitive area of hemorrhage or additional complication. A dressing was placed. The patient tolerated the procedure well without immediate post procedural complication. IMPRESSION: Successful ultrasound guided core needle biopsy of RIGHT hepatic lobe liver mass, as above. Michaelle Birks, MD [facility] Radiology Electronically Signed   By: Michaelle Birks M.D.   On: 09/25/2021 17:20   DG Chest Port 1 View  Result Date: 09/21/2021 CLINICAL DATA:  cough EXAM: PORTABLE CHEST 1 VIEW COMPARISON:  Chest x-ray 03/12/2017, CT chest 01/20/2020 FINDINGS: The heart and mediastinal contours are unchanged. Aortic calcification. Vascular clips overlie the mediastinum. Interval development of right perihilar and upper lobe airspace and interstitial opacities. No pulmonary edema. No pleural effusion. No pneumothorax. No acute osseous abnormality. IMPRESSION: 1. Interval development of right perihilar and upper lobe airspace and interstitial opacities. Recommend CT with intravenous contrast for further evaluation. 2.  Aortic  Atherosclerosis (ICD10-I70.0). Electronically Signed   By: Iven Finn M.D.   On: 09/21/2021 15:53   CT Renal Stone Study  Result Date: 09/21/2021 CLINICAL DATA:  Worsening renal function EXAM: CT ABDOMEN AND PELVIS WITHOUT CONTRAST TECHNIQUE: Multidetector CT imaging of the abdomen and pelvis was performed following the standard protocol without IV contrast. Unenhanced CT was performed per clinician order. Lack of IV contrast limits sensitivity and specificity, especially for evaluation of abdominal/pelvic solid viscera. RADIATION DOSE REDUCTION: This exam was performed according to the departmental dose-optimization program which includes automated exposure control, adjustment of the mA and/or kV according to patient size and/or use of iterative reconstruction technique. COMPARISON:  12/11/2013 FINDINGS: Lower chest: No acute pleural or parenchymal lung disease. Mild background emphysema. Hepatobiliary: Innumerable hypodensities are seen throughout the liver parenchyma consistent with metastatic disease. Evaluation limited on unenhanced exam. No intrahepatic duct dilation. The gallbladder is surgically absent. Pancreas: Unremarkable unenhanced appearance. Spleen: Unremarkable unenhanced appearance. Adrenals/Urinary Tract: No urinary tract calculi or obstructive uropathy within either kidney. Cortical thinning lower pole right kidney. Bilateral renal hypodensities are identified. 3.4 cm hypodensity within the lower pole right kidney and a 1.5 cm hypodensity within the lower pole left kidney compatible  with cyst. Small hypodensities within the upper pole right kidney are too small to characterize accurately. The adrenals are unremarkable. Bladder is minimally distended without focal abnormality. Stomach/Bowel: No bowel obstruction or ileus. Normal appendix right lower quadrant. Scattered diverticulosis of the sigmoid colon without evidence of diverticulitis. No bowel wall thickening or inflammatory change.  Vascular/Lymphatic: Aortic atherosclerosis. No enlarged abdominal or pelvic lymph nodes. Reproductive: Mild prostate enlargement measuring 4.6 x 5.7 cm. Other: No free fluid or free gas.  No abdominal wall hernia. Musculoskeletal: There is a destructive lytic lesion within the right L4 transverse process with associated soft tissue component, measuring 2.5 x 3.3 cm reference image 37/2. No other acute or destructive bony lesions. Reconstructed images demonstrate no additional findings. IMPRESSION: 1. Innumerable hypodensities throughout the liver consistent with diffuse hepatic metastases. 2. Destructive lytic lesion right L4 transverse process with associated soft tissue mass as above, consistent with metastatic disease. This would be the most efficacious location for tissue biopsy if desired. 3. Mild prostate enlargement. 4. No urinary tract calculi or obstructive uropathy. 5. Multiple bilateral renal hypodensities, the largest of which are compatible with cysts. Others within the upper pole right kidney are too small to characterize accurately. 6. Prominent cortical thinning/scarring lower pole right kidney. 7. Aortic Atherosclerosis (ICD10-I70.0) and Emphysema (ICD10-J43.9). Electronically Signed   By: Randa Ngo M.D.   On: 09/21/2021 16:17    Microbiology: Results for orders placed or performed during the hospital encounter of 09/21/21  Resp Panel by RT-PCR (Flu A&B, Covid) Nasopharyngeal Swab     Status: None   Collection Time: 09/21/21  3:21 PM   Specimen: Nasopharyngeal Swab; Nasopharyngeal(NP) swabs in vial transport medium  Result Value Ref Range Status   SARS Coronavirus 2 by RT PCR NEGATIVE NEGATIVE Final    Comment: (NOTE) SARS-CoV-2 target nucleic acids are NOT DETECTED.  The SARS-CoV-2 RNA is generally detectable in upper respiratory specimens during the acute phase of infection. The lowest concentration of SARS-CoV-2 viral copies this assay can detect is 138 copies/mL. A negative  result does not preclude SARS-Cov-2 infection and should not be used as the sole basis for treatment or other patient management decisions. A negative result may occur with  improper specimen collection/handling, submission of specimen other than nasopharyngeal swab, presence of viral mutation(s) within the areas targeted by this assay, and inadequate number of viral copies(<138 copies/mL). A negative result must be combined with clinical observations, patient history, and epidemiological information. The expected result is Negative.  Fact Sheet for Patients:  EntrepreneurPulse.com.au  Fact Sheet for Healthcare Providers:  IncredibleEmployment.be  This test is no t yet approved or cleared by the [place] FDA and  has been authorized for detection and/or diagnosis of SARS-CoV-2 by FDA under an Emergency Use Authorization (EUA). This EUA will remain  in effect (meaning this test can be used) for the duration of the COVID-19 declaration under Section 564(b)(1) of the Act, 21 U.S.C.section 360bbb-3(b)(1), unless the authorization is terminated  or revoked sooner.       Influenza A by PCR NEGATIVE NEGATIVE Final   Influenza B by PCR NEGATIVE NEGATIVE Final    Comment: (NOTE) The Xpert Xpress SARS-CoV-2/FLU/RSV plus assay is intended as an aid in the diagnosis of influenza from Nasopharyngeal swab specimens and should not be used as a sole basis for treatment. Nasal washings and aspirates are unacceptable for Xpert Xpress SARS-CoV-2/FLU/RSV testing.  Fact Sheet for Patients: EntrepreneurPulse.com.au  Fact Sheet for Healthcare Providers: IncredibleEmployment.be  This test is not  yet approved or cleared by the [place] and has been authorized for detection and/or diagnosis of SARS-CoV-2 by FDA under an Emergency Use Authorization (EUA). This EUA will remain in effect (meaning this test can be used)  for the duration of the COVID-19 declaration under Section 564(b)(1) of the Act, 21 U.S.C. section 360bbb-3(b)(1), unless the authorization is terminated or revoked.  Performed at [facility], [place] [address], [place], [place] [address]     Labs: CBC: Recent Labs  Lab 09/21/21 1516 09/22/21 0355 09/25/21 0020  WBC 5.4 4.8 4.7  NEUTROABS 4.1  --   --   HGB 13.1 11.2* 10.7*  HCT 40.3 35.1* 32.2*  MCV 88.0 88.6 87.0  PLT 177 140* 612*   Basic Metabolic Panel: Recent Labs  Lab 09/21/21 1516 09/22/21 0355 09/23/21 0557 09/24/21 0357 09/25/21 0020  NA 136 138 137 138 141  K 4.4 3.9 3.3* 2.9* 3.3*  CL 103 105 100 98 97*  CO2 18* 15* 21* 23 28  GLUCOSE 82 78 79 122* 56*  BUN 101* 96* 86* 69* 62*  CREATININE 5.30* 4.80* 4.53* 4.11* 3.86*  CALCIUM 10.1 9.2 9.1 9.1 8.8*   Liver Function Tests: Recent Labs  Lab 09/21/21 1516  AST 218*  ALT 130*  ALKPHOS 344*  BILITOT 0.9  PROT 7.5  ALBUMIN 3.5   CBG: Recent Labs  Lab 09/22/21 2010 09/25/21 0759 09/25/21 0854  GLUCAP 105* 58* 104*    Discharge time spent: greater than 30 minutes.  Signed: Murray Hodgkins, MD [facility] 09/25/2021

## 2021-09-25 NOTE — Plan of Care (Signed)
Upon returning from biopsy pt educated of 2 hours of bedrest. Upon returning to pt with food. Pt found to be sitting on edge of bed. Pt reeducated that pt must be flat in bed. Put bed in trendelenburg so pt could eat.  PIV removed. DC paperwork reviewed with pt and family. Questions answered. Belongings gathered by family.  ? ? ?Problem: Education: ?Goal: Knowledge of General Education information will improve ?Description: Including pain rating scale, medication(s)/side effects and non-pharmacologic comfort measures ?Outcome: Completed/Met ?  ?Problem: Health Behavior/Discharge Planning: ?Goal: Ability to manage health-related needs will improve ?Outcome: Completed/Met ?  ?Problem: Clinical Measurements: ?Goal: Ability to maintain clinical measurements within normal limits will improve ?Outcome: Completed/Met ?Goal: Will remain free from infection ?Outcome: Completed/Met ?Goal: Diagnostic test results will improve ?Outcome: Completed/Met ?Goal: Respiratory complications will improve ?Outcome: Completed/Met ?Goal: Cardiovascular complication will be avoided ?Outcome: Completed/Met ?  ?Problem: Activity: ?Goal: Risk for activity intolerance will decrease ?Outcome: Completed/Met ?  ?Problem: Nutrition: ?Goal: Adequate nutrition will be maintained ?Outcome: Completed/Met ?  ?Problem: Coping: ?Goal: Level of anxiety will decrease ?Outcome: Completed/Met ?  ?Problem: Elimination: ?Goal: Will not experience complications related to bowel motility ?Outcome: Completed/Met ?Goal: Will not experience complications related to urinary retention ?Outcome: Completed/Met ?  ?Problem: Pain Managment: ?Goal: General experience of comfort will improve ?Outcome: Completed/Met ?  ?Problem: Safety: ?Goal: Ability to remain free from injury will improve ?Outcome: Completed/Met ?  ?Problem: Skin Integrity: ?Goal: Risk for impaired skin integrity will decrease ?Outcome: Completed/Met ?  ?

## 2021-09-26 LAB — SURGICAL PATHOLOGY

## 2021-10-03 DIAGNOSIS — Z20822 Contact with and (suspected) exposure to covid-19: Secondary | ICD-10-CM | POA: Diagnosis not present

## 2021-10-05 DIAGNOSIS — N184 Chronic kidney disease, stage 4 (severe): Secondary | ICD-10-CM | POA: Diagnosis not present

## 2021-10-05 DIAGNOSIS — C3490 Malignant neoplasm of unspecified part of unspecified bronchus or lung: Secondary | ICD-10-CM | POA: Diagnosis not present

## 2021-10-05 DIAGNOSIS — C7951 Secondary malignant neoplasm of bone: Secondary | ICD-10-CM | POA: Diagnosis not present

## 2021-10-05 DIAGNOSIS — E1122 Type 2 diabetes mellitus with diabetic chronic kidney disease: Secondary | ICD-10-CM | POA: Diagnosis not present

## 2021-10-06 ENCOUNTER — Encounter: Payer: Self-pay | Admitting: *Deleted

## 2021-10-06 ENCOUNTER — Telehealth: Payer: Self-pay | Admitting: Internal Medicine

## 2021-10-06 ENCOUNTER — Telehealth: Payer: Self-pay | Admitting: Radiation Oncology

## 2021-10-06 DIAGNOSIS — R918 Other nonspecific abnormal finding of lung field: Secondary | ICD-10-CM

## 2021-10-06 NOTE — Telephone Encounter (Signed)
Scheduled appt per 3/17 referral. Pt is aware of appt date and time. Pt is aware to arrive 15 mins prior to appt time and to bring and updated insurance card. Pt is aware of appt location.   ?

## 2021-10-06 NOTE — Telephone Encounter (Signed)
Called patient to schedule a consultation w. Dr. Sondra Come. No answer, unable to LVM phone line just rings.  ?

## 2021-10-06 NOTE — Progress Notes (Signed)
Oncology Nurse Navigator Documentation ? ?Oncology Nurse Navigator Flowsheets 10/06/2021  ?Navigator Follow Up Date: 10/13/2021  ?Navigator Follow Up Reason: New Patient Appointment  ?Navigator Location [facility]  ?Referral Date to RadOnc/MedOnc 10/06/2021  ?Navigator Encounter Type Initial MedOnc  ?Patient Visit Type Initial  ?Treatment Phase Pre-Tx/Tx Discussion  ?Barriers/Navigation Needs Coordination of Care/I received referral on Paul Mcneil today. I notified new patient coordinator to call and schedule him to be seen next week with labs. Per Dr. Julien Nordmann will make referral to rad onc.   ?Interventions Coordination of Care  ?Acuity Level 2-Minimal Needs (1-2 Barriers Identified)  ?Coordination of Care Other  ?Time Spent with Patient 30  ?  ?

## 2021-10-16 ENCOUNTER — Encounter: Payer: Self-pay | Admitting: *Deleted

## 2021-10-16 ENCOUNTER — Inpatient Hospital Stay: Payer: Medicare Other

## 2021-10-16 ENCOUNTER — Encounter: Payer: Self-pay | Admitting: Internal Medicine

## 2021-10-16 ENCOUNTER — Inpatient Hospital Stay: Payer: Medicare Other | Attending: Internal Medicine | Admitting: Internal Medicine

## 2021-10-16 ENCOUNTER — Other Ambulatory Visit: Payer: Self-pay

## 2021-10-16 VITALS — BP 119/51 | HR 73 | Temp 97.3°F | Resp 18 | Ht 73.0 in | Wt 205.0 lb

## 2021-10-16 DIAGNOSIS — C7951 Secondary malignant neoplasm of bone: Secondary | ICD-10-CM | POA: Diagnosis not present

## 2021-10-16 DIAGNOSIS — Z5112 Encounter for antineoplastic immunotherapy: Secondary | ICD-10-CM | POA: Insufficient documentation

## 2021-10-16 DIAGNOSIS — C3411 Malignant neoplasm of upper lobe, right bronchus or lung: Secondary | ICD-10-CM | POA: Insufficient documentation

## 2021-10-16 DIAGNOSIS — R918 Other nonspecific abnormal finding of lung field: Secondary | ICD-10-CM

## 2021-10-16 DIAGNOSIS — Z5111 Encounter for antineoplastic chemotherapy: Secondary | ICD-10-CM

## 2021-10-16 DIAGNOSIS — C3490 Malignant neoplasm of unspecified part of unspecified bronchus or lung: Secondary | ICD-10-CM

## 2021-10-16 DIAGNOSIS — Z79899 Other long term (current) drug therapy: Secondary | ICD-10-CM | POA: Diagnosis not present

## 2021-10-16 DIAGNOSIS — C787 Secondary malignant neoplasm of liver and intrahepatic bile duct: Secondary | ICD-10-CM | POA: Diagnosis not present

## 2021-10-16 LAB — CMP (CANCER CENTER ONLY)
ALT: 132 U/L — ABNORMAL HIGH (ref 0–44)
AST: 360 U/L (ref 15–41)
Albumin: 3.1 g/dL — ABNORMAL LOW (ref 3.5–5.0)
Alkaline Phosphatase: 729 U/L — ABNORMAL HIGH (ref 38–126)
Anion gap: 26 — ABNORMAL HIGH (ref 5–15)
BUN: 80 mg/dL — ABNORMAL HIGH (ref 8–23)
CO2: 20 mmol/L — ABNORMAL LOW (ref 22–32)
Calcium: 9.3 mg/dL (ref 8.9–10.3)
Chloride: 96 mmol/L — ABNORMAL LOW (ref 98–111)
Creatinine: 6.1 mg/dL (ref 0.61–1.24)
GFR, Estimated: 9 mL/min — ABNORMAL LOW (ref >60)
Glucose, Bld: 79 mg/dL (ref 70–99)
Potassium: 3.1 mmol/L — ABNORMAL LOW (ref 3.5–5.1)
Sodium: 142 mmol/L (ref 135–145)
Total Bilirubin: 3.8 mg/dL (ref 0.3–1.2)
Total Protein: 5.9 g/dL — ABNORMAL LOW (ref 6.5–8.1)

## 2021-10-16 LAB — CBC WITH DIFFERENTIAL (CANCER CENTER ONLY)
Abs Immature Granulocytes: 0.12 10*3/uL — ABNORMAL HIGH (ref 0.00–0.07)
Basophils Absolute: 0 10*3/uL (ref 0.0–0.1)
Basophils Relative: 0 %
Eosinophils Absolute: 0 10*3/uL (ref 0.0–0.5)
Eosinophils Relative: 0 %
HCT: 35.4 % — ABNORMAL LOW (ref 39.0–52.0)
Hemoglobin: 11.8 g/dL — ABNORMAL LOW (ref 13.0–17.0)
Immature Granulocytes: 2 %
Lymphocytes Relative: 9 %
Lymphs Abs: 0.7 10*3/uL (ref 0.7–4.0)
MCH: 29.2 pg (ref 26.0–34.0)
MCHC: 33.3 g/dL (ref 30.0–36.0)
MCV: 87.6 fL (ref 80.0–100.0)
Monocytes Absolute: 0.6 10*3/uL (ref 0.1–1.0)
Monocytes Relative: 8 %
Neutro Abs: 6.7 10*3/uL (ref 1.7–7.7)
Neutrophils Relative %: 81 %
Platelet Count: 144 10*3/uL — ABNORMAL LOW (ref 150–400)
RBC: 4.04 MIL/uL — ABNORMAL LOW (ref 4.22–5.81)
RDW: 18.9 % — ABNORMAL HIGH (ref 11.5–15.5)
WBC Count: 8.2 10*3/uL (ref 4.0–10.5)
nRBC: 0 % (ref 0.0–0.2)

## 2021-10-16 MED ORDER — LIDOCAINE-PRILOCAINE 2.5-2.5 % EX CREA: TOPICAL_CREAM | CUTANEOUS | 0 refills | Status: DC

## 2021-10-16 MED ORDER — PROCHLORPERAZINE MALEATE 10 MG PO TABS
10.0000 mg | ORAL_TABLET | Freq: Four times a day (QID) | ORAL | 0 refills | Status: DC | PRN
Start: 2021-10-16 — End: 2021-10-17

## 2021-10-16 NOTE — Progress Notes (Signed)
Paul Mcneil did have an appt to be seen Friday of last week but could not make that appt. He was seen today.  ?

## 2021-10-16 NOTE — Progress Notes (Signed)
Oncology Nurse Navigator Documentation ? ? ?  10/16/2021  ?  4:00 PM 10/06/2021  ? 12:00 PM  ?Oncology Nurse Navigator Flowsheets  ?Navigator Follow Up Date: 10/18/2021 10/13/2021  ?Navigator Follow Up Reason: Appointment Review New Patient Appointment  ?Navigator Location [facility]  ?Referral Date to RadOnc/MedOnc  10/06/2021  ?Navigator Encounter Type Clinic/MDC;Initial MedOnc Initial MedOnc  ?Patient Visit Type MedOnc;Initial Initial  ?Treatment Phase Pre-Tx/Tx Discussion Pre-Tx/Tx Discussion  ?Barriers/Navigation Needs Education;Coordination of Care Coordination of Care  ?Education Newly Diagnosed Cancer Education;Understanding Cancer/ Treatment Options;Other   ?Interventions Coordination of Care;Education;Psycho-Social Support/I spoke to Paul Mcneil and his daughter today at his first appt with Dr. Julien Nordmann. Per Dr.  Julien Nordmann, patient is dx with extensive stage small cell lung cancer.  He is having back pain and per Dr. Julien Nordmann due to lytic lesion. Patient has an appt to see Dr. Sondra Come with rad on next week. I reached out to him to see if he can see the patient sooner for pain relief. Dr. Julien Nordmann ordered scans and tx plan. I will follow up on his schedule.  Coordination of Care  ?Acuity Level 3-Moderate Needs (3-4 Barriers Identified) Level 2-Minimal Needs (1-2 Barriers Identified)  ?Coordination of Care Other Other  ?Education Method Verbal;Other   ?Time Spent with Patient 60 30  ?  ?

## 2021-10-16 NOTE — Progress Notes (Signed)
START ON PATHWAY REGIMEN - Small Cell Lung ? ? ?  Cycles 1 through 4: A cycle is every 21 days: ?    Durvalumab  ?    Carboplatin  ?    Etoposide  ?  Cycles 5 and beyond: A cycle is every 28 days: ?    Durvalumab  ? ?**Always confirm dose/schedule in your pharmacy ordering system** ? ?Patient Characteristics: ?Newly Diagnosed, Preoperative or Nonsurgical Candidate (Clinical Staging), First Line, Extensive Stage ?Therapeutic Status: Newly Diagnosed, Preoperative or Nonsurgical Candidate (Clinical Staging) ?AJCC T Category: cT3 ?AJCC N Category: cN2 ?AJCC M Category: cM1c ?AJCC 8 Stage Grouping: IVB ?Stage Classification: Extensive ? ?Intent of Therapy: ?Non-Curative / Palliative Intent, Discussed with Patient ?

## 2021-10-16 NOTE — Patient Instructions (Addendum)
PET Scan ?A PET scan, or positron emission tomography, is an imaging test. A large scanner with a camera creates pictures of your organs and tissues. The pictures are used to study and detect diseases such as cancer. ?For this test, a tiny amount of radioactive material, called a tracer, will be injected into your blood. The tracer will travel through your bloodstream and create colors and brightness on the pictures if there are problems. On a PET scan image, for example, cancer tissue appears brighter than normal tissue. ?Tell a health care provider about: ?Any allergies you have. ?All medicines you are taking, including vitamins, herbs, eye drops, creams, and over-the-counter medicines. ?Any blood disorders you have. ?Any surgeries you have had. ?Any medical conditions you have. ?Any fear of cramped spaces (claustrophobia). You may be given medicine to help you relax (sedative) or a medicine to treat anxiety if this is a problem. ?Any trouble you have staying still for long periods of time. ?Whether you are pregnant, may be pregnant, or are breastfeeding. ?What are the risks? ?Generally, this is a safe test. However, problems may occur, including: ?Bleeding, pain, or swelling at the IV site. ?Allergic reaction to the radioactive tracer. This is rare. ?Exposure to radiation (a small amount). ?What happens before the procedure? ?Follow instructions from your health care provider about eating or drinking restrictions. ?Ask your health care provider about: ?Changing or stopping your regular medicines. This is especially important if you are taking diabetes medicines or blood thinners. ?Taking medicines such as aspirin and ibuprofen. These medicines can thin your blood. Do not take these medicines unless your health care provider tells you to take them. ?Taking over-the-counter medicines, vitamins, herbs, and supplements. ?If you have diabetes, ask your health care provider for diet guidelines to control your blood  sugar (glucose) levels on the day of the test. ?What happens during the procedure? ? ?An IV will be inserted into one of your veins. ?You may be given a sedative through the IV. ?A small amount of radioactive tracer will be injected through the IV. ?You will wait 30-60 minutes after the injection. This allows the tracer to travel through your body. ?You will lie on a cushioned table. The table will move through the center of the scanning machine. ?The scanning machine will create pictures of your body. This can last for about 30-60 minutes. You will need to stay very still during this time. ?The procedure may vary among health care providers and hospitals. ?What can I expect after the procedure? ?You may be taken to a recovery area if sedation medicines were used. Your blood pressure, heart rate, breathing rate, and blood oxygen level will be monitored until you leave the hospital or clinic. ?It is up to you to get your test results. Ask your health care provider, or the department that is doing the test, when your results will be ready. Also ask: ?How will I get my results? ?What are my treatment options? ?What other tests do I need? ?What are my next steps? ?Follow these instructions at home: ?Drink 6-8 glasses of water after the test to flush the radioactive tracer out of your body. Drink enough fluid to keep your urine pale yellow. ?You may return to your normal diet and activities as told by your health care provider. ?Summary ?A PET scan, or positron emission tomography, is an imaging test that creates pictures of your organs and tissues. The pictures are used to study and detect diseases such as cancer. ?For this  test, a small amount of radioactive tracer will be injected and travel through your bloodstream. ?You will lie flat on a table and be very still. The table will move through the scanning machine that creates pictures of your body. ?If the images show colors and brightness, that is a sign of a problem  in that area. Ask your health care provider what your results mean. ?This information is not intended to replace advice given to you by your health care provider. Make sure you discuss any questions you have with your health care provider. ?Document Revised: 03/22/2021 Document Reviewed: 11/14/2019 ?Elsevier Patient Education ? [place]. ?Lung Cancer ?Lung cancer is an abnormal growth of cancerous cells that forms a mass (malignant tumor) in a lung. There are several types of lung cancer. The types are based on the appearance of the tumor cells. The two most common types are: ?Non-small cell lung cancer. This type of lung cancer is the most common type. Non-small cell lung cancers include squamous cell carcinoma, adenocarcinoma, and large cell carcinoma. ?Small cell lung cancer. In this type of lung cancer, abnormal cells are smaller than those of non-small cell lung cancer. Small cell lung cancer gets worse (progresses) faster than non-small cell lung cancer. ?What are the causes? ?The most common cause of lung cancer is smoking tobacco. The second most common cause is exposure to a chemical called radon. ?What increases the risk? ?You are more likely to develop this condition if: ?You smoke tobacco. ?You have been exposed to: ?Secondhand tobacco smoke. ?Radon gas. ?Uranium. ?Asbestos. ?Arsenic in drinking water. ?Air pollution and diesel exhaust. ?You have a family or personal history of lung cancer. ?You have had lung radiation therapy in the past. ?You are older than age 61. ?What are the signs or symptoms? ?In the early stages, you may not have any symptoms. As the cancer progresses, symptoms may include: ?A lasting cough, possibly with blood. ?Fatigue. ?Unexplained weight loss. ?Shortness of breath. ?High-pitched whistling sounds when you breathe, most often when you breathe out (wheezing). ?Chest pain. ?Loss of appetite. ?Symptoms of advanced lung cancer include: ?Hoarseness. ?Bone or joint  pain. ?Weakness. ?Change in the structure of the fingernails (clubbing), so that the nail looks like an upside-down spoon. ?Swelling of the face or arms. ?Inability to move the face (paralysis). ?Drooping eyelids. ?How is this diagnosed? ?This condition may be diagnosed based on: ?Your symptoms and medical history. ?A physical exam. ?A chest X-ray. ?A CT scan. ?Blood tests. ?Sputum tests. ?Removal of a sample of lung tissue (lung biopsy) for testing. ?Your cancer will be assessed (staged) to determine how severe it is and how much it has spread (metastasized). ?How is this treated? ?Treatment depends on the type and stage of your cancer. Treatment may include one or more of the following: ?Surgery to remove as much of the cancer as possible. Lymph nodes in the area may be removed and tested for cancer as well. ?Medicines that kill cancer cells (chemotherapy). ?High-energy rays that kill cancer cells (radiation therapy). ?Targeted therapy. This targets specific parts of cancer cells and the area around them to block the growth and spread of the cancer. Targeted therapy can help limit the damage to healthy cells. ?Immunotherapy. This treatment uses a person's own immune system to fight cancer by either boosting the immune system or changing how the immune system works. ?Follow these instructions at home: ? ?Do not use any products that contain nicotine or tobacco. These products include cigarettes, chewing tobacco,  and vaping devices, such as e-cigarettes. If you need help quitting, ask your health care provider. ?Do not drink alcohol. ?If you are admitted to the hospital, make sure your cancer specialist (oncologist) is aware. Your cancer may affect your treatment for other conditions. ?Take over-the-counter and prescription medicines only as told by your health care provider. ?Work with your health care provider to manage any side effects of treatment. ?Keep all follow-up visits. This is important. ?Where to find  support ?Consider joining a local support group for people who have been diagnosed with lung cancer. ?Where to find more information ?American Cancer Society: www.cancer.org ?[place] www.c

## 2021-10-16 NOTE — Progress Notes (Signed)
? ? [place] ?[phone]   [phone] ? ?CONSULT NOTE ? ?REFERRING PHYSICIAN: Dr. Murray Hodgkins ? ?REASON FOR CONSULTATION:  ?76 years old white male recently diagnosed with lung cancer. ? ?HPI ?Paul Mcneil is a 76 y.o. male with past medical history significant for multiple medical problems including history of coronary artery disease status post CABG 5 years ago, COPD, diabetes mellitus, osteoarthritis, GERD as well as dyslipidemia and carotid artery stenosis.  The patient mentioned that he has been doing well until few weeks ago when he started having back pain for 2-3 weeks.  He also had cough and was seen by his primary care physician.  Chest x-ray was performed on September 21, 2021 and showed right perihilar upper lobe airspace disease.  The patient was also found during his evaluation to have worsening renal function and CT renal was performed on September 21, 2021 and that showed innumerable hypodensities throughout the liver consistent with diffuse hepatic metastasis.  There was also destructive lytic lesion in the right L4 transverse process with associated soft tissue mass consistent with metastatic disease.  The patient had CT scan of the chest without contrast on September 20, 2021 and it showed infiltrative perihilar right lung mass measuring 4.4 x 4.0 cm.  There is right hilar invasion with encasement and narrowing of the right upper lobe bronchus with postobstructive pneumonitis.  There was some concern about lymphangitic spread of disease in the right upper lobe.  There was a stable cystic nodule within the left upper lobe measuring 0.9 cm and solid nodule within the posterior left lower lobe unchanged from previous exam and measuring 0.9 cm.  There was also right paratracheal adenopathy compatible with metastatic adenopathy the scan of the upper abdomen showed numerous ill-defined low-attenuation lesions throughout both lobes of the liver concerning for liver  metastasis.  The scan also showed multifocal lucent bone lesions compatible with lytic bone metastasis.  On September 25, 2021 the patient underwent ultrasound-guided liver biopsy by interventional radiology. 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 ?The patient was referred to me today for evaluation and recommendation regarding treatment of his condition. ?When seen today the mentions that he is feeling terrible with burning sensation in the epigastric area.  He is currently on Nexium but does not take it regularly.  He also complains of nausea and constipation.  He has the low back pain as well as weakness in the legs bilaterally.  He also has dizzy spells hard for him to walk he has dry cough with no chest pain, shortness of breath or hemoptysis.  He denied having any headache or visual changes.  He also complained of right shoulder pain. ?Family history significant for mother with rheumatoid arthritis.  Father had bone cancer. ?The patient is single and has 1 daughter, Adriane who accompanied him to the visit today.  He used to work as a Engineer, structural.  He has a history of smoking 3 packs/day for around 55 years and quit 5 years ago.  He also quit alcohol drinking 5 years ago and no history of drug abuse. ? ?HPI ? ?Past Medical History:  ?Diagnosis Date  ? Anemia 1964  ? Arthritis   ? "touch starting to show up in the joints" (12/11/2013)  ? CAD (coronary artery disease), native coronary artery   ? a. Cath 01/30/17 showing multivessel CAD --> underwent CABG on 02/15/2017 with LIMA-LAD, SVG-PDA, and SVG-Ramus-OM1.   ? Carotid artery calcification   ?  a. 01/2017: s/p L CEA performed at time of CABG; Existing Mod-Severe R Carotid stenosis  ? Chronic kidney disease, stage IV (severe) ([place])   ? "my kidneys are down to 30% function"; nephrologist-Dr. Carolin Sicks  ? COPD (chronic obstructive pulmonary disease) [place]   ? "they said I had it"  ? Diabetes ([place])   ? Dyspnea   ? with exertion  ?  Former heavy tobacco smoker   ? GERD (gastroesophageal reflux disease)   ? Dr. Michail Sermon  ? History of alcohol abuse   ? 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 LIMA-LAD, SVG-PDA, SVG-RI-OM 1  ? Hyperlipemia   ? ? ?Past Surgical History:  ?Procedure Laterality Date  ? APPENDECTOMY  12/11/2013  ? "laparoscopic"  ? BRONCHOSCOPY    ? CHOLECYSTECTOMY  ~ 2010  ? CORONARY ARTERY BYPASS GRAFT N/A 02/15/2017  ? Procedure: CORONARY ARTERY BYPASS GRAFTING (CABG) x4 :LAD to  LIMA, SVG to PDA , Sequential SVG to OM1 and Ramus;  Surgeon: Melrose Nakayama, MD;  Location: [facility];  Service: Open Heart Surgery;  Laterality: N/A;  ? ENDARTERECTOMY Left 02/15/2017  ? Procedure: LEFT ENDARTERECTOMY CAROTID;  Surgeon: Angelia Mould, MD;  Location: [facility];  Service: Vascular;  Laterality: Left;  ? ENDARTERECTOMY Right 03/04/2018  ? Procedure: ENDARTERECTOMY CAROTID RIGHT WITH PRIMARY CLOSURE OF ARTERY;  Surgeon: Angelia Mould, MD;  Location: [facility];  Service: Vascular;  Laterality: Right;  ? LAPAROSCOPIC APPENDECTOMY N/A 12/11/2013  ? Procedure: APPENDECTOMY LAPAROSCOPIC;  Surgeon: Imogene Burn. Georgette Dover, MD;  Location: [facility];  Service: General;  Laterality: N/A;  ? RIGHT/LEFT HEART CATH AND CORONARY ANGIOGRAPHY N/A 01/30/2017  ? Procedure: Right/Left Heart Cath and Coronary Angiography;  Surgeon: Troy Sine, MD;  Location: [facility];  Service: Cardiovascular: Normal RHC pressures.  Significant multivessel CAD with 50-60% smooth d-LM, 40% mid LAD. 50% mid LAD stenoses; RI --70% ostial & 80% prox RI; 90% pCx w/  80% and 70% distal; 80% oxt-prox & 90% prox-mid -> 100% CTO distal mid RCA w/ L-R collaterals  ? TEE WITHOUT CARDIOVERSION N/A 02/15/2017  ? Procedure: TRANSESOPHAGEAL ECHOCARDIOGRAM (TEE);  Surgeon: Melrose Nakayama, MD;  Location: [facility];  Service: Open Heart Surgery: Vigorous wall motion.  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  Mild MR noted.  ?  TONSILLECTOMY AND ADENOIDECTOMY  1950's  ? TRANSTHORACIC ECHOCARDIOGRAM  01/2017  ? 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  Otherwise normal cavity size.  Mild LVH.  EF 60-65%.  GR 1 DD.  Mild MR.  ? ? ?Family History  ?Problem Relation Age of Onset  ? Rheum arthritis Mother   ? Cancer Father   ?     BONE MARROW  ? ? ?Social History ?Social History  ? ?Tobacco Use  ? Smoking status: Former  ?  Packs/day: 2.00  ?  Years: 55.00  ?  Pack years: 110.00  ?  Types: Cigarettes  ?  Quit date: 09/21/2016  ?  Years since quitting: 5.0  ? Smokeless tobacco: Never  ?Vaping Use  ? Vaping Use: Never used  ?Substance Use Topics  ? Alcohol use: No  ?  Alcohol/week: 20.0 standard drinks  ?  Types: 20 Glasses of wine per week  ?  Comment: quit 1 year ago- in 2018  ? Drug use: No  ? ? ?Allergies  ?Allergen Reactions  ? Ceclor [Cefaclor] Anaphylaxis, Hives and Other (See Comments)  ?  BASED ON CRITERIA FOR ANAPHYLAXIS ?HIVES + HYPOTENSION  ? ? ?Current Outpatient Medications  ?Medication Sig Dispense Refill  ? acetaminophen (TYLENOL) 500 MG tablet Take 1,000 mg by mouth every 6 (six) hours as needed for moderate pain.    ? aspirin EC 81 MG tablet Take 1 tablet (81 mg total) by mouth daily. Swallow whole. Start 3/9.    ? bisoprolol (ZEBETA) 10 MG tablet Take 1 tablet (10 mg total) by mouth daily. 90 tablet 2  ? cholecalciferol (VITAMIN D) 25 MCG (1000 UNIT) tablet Take 4,000 Units by mouth daily.    ? Ensure Max Protein (ENSURE MAX PROTEIN) LIQD Take 330 mLs (11 oz total) by mouth daily.    ? esomeprazole (NEXIUM) 40 MG capsule Take 1 capsule (40 mg total) by mouth daily as needed (for acid reflux). 90 capsule 3  ? Lactobacillus Casei-Folic Acid 29-9.37 MG CAPS Take 1 tablet by mouth daily as needed (for digestive health).     ? LEVEMIR FLEXTOUCH 100 UNIT/ML Pen Inject 10 Units into the skin 2 (two) times daily.    ? Multiple Vitamin (MULTIVITAMIN WITH MINERALS) TABS tablet Take 1 tablet by mouth every other day. [employer]  Formula    ? omega-3 acid ethyl esters (LOVAZA) 1 g capsule Take 1 capsule (1 g total) by mouth 2 (two) times daily. (Patient taking differently: Take 2 g by mouth daily.) 180 capsule 3  ? ondansetron (ZOFRAN)

## 2021-10-17 ENCOUNTER — Other Ambulatory Visit: Payer: Self-pay | Admitting: Internal Medicine

## 2021-10-17 ENCOUNTER — Ambulatory Visit: Payer: Medicare Other

## 2021-10-17 ENCOUNTER — Encounter: Payer: Self-pay | Admitting: *Deleted

## 2021-10-17 ENCOUNTER — Inpatient Hospital Stay: Payer: Medicare Other

## 2021-10-17 ENCOUNTER — Telehealth: Payer: Self-pay | Admitting: Internal Medicine

## 2021-10-17 VITALS — BP 117/50 | HR 70 | Temp 97.6°F | Resp 18

## 2021-10-17 DIAGNOSIS — I10 Essential (primary) hypertension: Secondary | ICD-10-CM | POA: Diagnosis not present

## 2021-10-17 DIAGNOSIS — C7951 Secondary malignant neoplasm of bone: Secondary | ICD-10-CM | POA: Diagnosis not present

## 2021-10-17 DIAGNOSIS — Z808 Family history of malignant neoplasm of other organs or systems: Secondary | ICD-10-CM | POA: Diagnosis not present

## 2021-10-17 DIAGNOSIS — D649 Anemia, unspecified: Secondary | ICD-10-CM | POA: Diagnosis not present

## 2021-10-17 DIAGNOSIS — C3411 Malignant neoplasm of upper lobe, right bronchus or lung: Secondary | ICD-10-CM

## 2021-10-17 DIAGNOSIS — N179 Acute kidney failure, unspecified: Secondary | ICD-10-CM | POA: Diagnosis not present

## 2021-10-17 DIAGNOSIS — C3490 Malignant neoplasm of unspecified part of unspecified bronchus or lung: Secondary | ICD-10-CM | POA: Diagnosis not present

## 2021-10-17 DIAGNOSIS — N400 Enlarged prostate without lower urinary tract symptoms: Secondary | ICD-10-CM | POA: Diagnosis not present

## 2021-10-17 DIAGNOSIS — Z7189 Other specified counseling: Secondary | ICD-10-CM | POA: Diagnosis not present

## 2021-10-17 DIAGNOSIS — E1122 Type 2 diabetes mellitus with diabetic chronic kidney disease: Secondary | ICD-10-CM | POA: Diagnosis present

## 2021-10-17 DIAGNOSIS — I6521 Occlusion and stenosis of right carotid artery: Secondary | ICD-10-CM | POA: Diagnosis present

## 2021-10-17 DIAGNOSIS — K219 Gastro-esophageal reflux disease without esophagitis: Secondary | ICD-10-CM | POA: Diagnosis present

## 2021-10-17 DIAGNOSIS — E440 Moderate protein-calorie malnutrition: Secondary | ICD-10-CM | POA: Diagnosis not present

## 2021-10-17 DIAGNOSIS — Z951 Presence of aortocoronary bypass graft: Secondary | ICD-10-CM | POA: Diagnosis not present

## 2021-10-17 DIAGNOSIS — R918 Other nonspecific abnormal finding of lung field: Secondary | ICD-10-CM | POA: Diagnosis not present

## 2021-10-17 DIAGNOSIS — C787 Secondary malignant neoplasm of liver and intrahepatic bile duct: Secondary | ICD-10-CM | POA: Diagnosis present

## 2021-10-17 DIAGNOSIS — G893 Neoplasm related pain (acute) (chronic): Secondary | ICD-10-CM | POA: Diagnosis not present

## 2021-10-17 DIAGNOSIS — E8721 Acute metabolic acidosis: Secondary | ICD-10-CM | POA: Diagnosis not present

## 2021-10-17 DIAGNOSIS — I2510 Atherosclerotic heart disease of native coronary artery without angina pectoris: Secondary | ICD-10-CM | POA: Diagnosis not present

## 2021-10-17 DIAGNOSIS — I129 Hypertensive chronic kidney disease with stage 1 through stage 4 chronic kidney disease, or unspecified chronic kidney disease: Secondary | ICD-10-CM | POA: Diagnosis not present

## 2021-10-17 DIAGNOSIS — K7689 Other specified diseases of liver: Secondary | ICD-10-CM | POA: Diagnosis not present

## 2021-10-17 DIAGNOSIS — E872 Acidosis, unspecified: Secondary | ICD-10-CM | POA: Diagnosis not present

## 2021-10-17 DIAGNOSIS — R7989 Other specified abnormal findings of blood chemistry: Secondary | ICD-10-CM | POA: Diagnosis not present

## 2021-10-17 DIAGNOSIS — J439 Emphysema, unspecified: Secondary | ICD-10-CM | POA: Diagnosis not present

## 2021-10-17 DIAGNOSIS — R0602 Shortness of breath: Secondary | ICD-10-CM | POA: Diagnosis not present

## 2021-10-17 DIAGNOSIS — R17 Unspecified jaundice: Secondary | ICD-10-CM | POA: Diagnosis not present

## 2021-10-17 DIAGNOSIS — R160 Hepatomegaly, not elsewhere classified: Secondary | ICD-10-CM | POA: Diagnosis not present

## 2021-10-17 DIAGNOSIS — N184 Chronic kidney disease, stage 4 (severe): Secondary | ICD-10-CM | POA: Diagnosis not present

## 2021-10-17 DIAGNOSIS — I679 Cerebrovascular disease, unspecified: Secondary | ICD-10-CM | POA: Diagnosis present

## 2021-10-17 DIAGNOSIS — N281 Cyst of kidney, acquired: Secondary | ICD-10-CM | POA: Diagnosis not present

## 2021-10-17 DIAGNOSIS — J189 Pneumonia, unspecified organism: Secondary | ICD-10-CM | POA: Diagnosis present

## 2021-10-17 DIAGNOSIS — K6289 Other specified diseases of anus and rectum: Secondary | ICD-10-CM | POA: Diagnosis not present

## 2021-10-17 DIAGNOSIS — Z66 Do not resuscitate: Secondary | ICD-10-CM | POA: Diagnosis not present

## 2021-10-17 DIAGNOSIS — K5730 Diverticulosis of large intestine without perforation or abscess without bleeding: Secondary | ICD-10-CM | POA: Diagnosis not present

## 2021-10-17 DIAGNOSIS — N189 Chronic kidney disease, unspecified: Secondary | ICD-10-CM | POA: Diagnosis not present

## 2021-10-17 DIAGNOSIS — Z515 Encounter for palliative care: Secondary | ICD-10-CM | POA: Diagnosis not present

## 2021-10-17 DIAGNOSIS — E8809 Other disorders of plasma-protein metabolism, not elsewhere classified: Secondary | ICD-10-CM | POA: Diagnosis present

## 2021-10-17 DIAGNOSIS — E785 Hyperlipidemia, unspecified: Secondary | ICD-10-CM | POA: Diagnosis present

## 2021-10-17 DIAGNOSIS — R32 Unspecified urinary incontinence: Secondary | ICD-10-CM | POA: Diagnosis present

## 2021-10-17 DIAGNOSIS — E1169 Type 2 diabetes mellitus with other specified complication: Secondary | ICD-10-CM | POA: Diagnosis not present

## 2021-10-17 DIAGNOSIS — D631 Anemia in chronic kidney disease: Secondary | ICD-10-CM | POA: Diagnosis not present

## 2021-10-17 MED ORDER — LIDOCAINE-PRILOCAINE 2.5-2.5 % EX CREA: TOPICAL_CREAM | CUTANEOUS | 0 refills | Status: AC

## 2021-10-17 MED ORDER — PROCHLORPERAZINE MALEATE 10 MG PO TABS: 10.0000 mg | ORAL_TABLET | Freq: Four times a day (QID) | ORAL | 0 refills | Status: AC | PRN

## 2021-10-17 MED ORDER — SODIUM CHLORIDE 0.9 % IV SOLN: Freq: Once | INTRAVENOUS | Status: AC

## 2021-10-17 NOTE — Patient Instructions (Signed)

## 2021-10-17 NOTE — Telephone Encounter (Signed)
.  Called patient to schedule appointment per 3/28 secure chat, patient is aware of date and time.   ?

## 2021-10-17 NOTE — Progress Notes (Signed)
per Dr. Julien Nordmann - 1 liter NS over 2 hours. ?

## 2021-10-17 NOTE — Progress Notes (Signed)
Oncology Nurse Navigator Documentation ? ? ?  10/17/2021  ? 10:00 AM 10/16/2021  ?  4:00 PM 10/06/2021  ? 12:00 PM  ?Oncology Nurse Navigator Flowsheets  ?Navigator Follow Up Date:  10/18/2021 10/13/2021  ?Navigator Follow Up Reason:  Appointment Review New Patient Appointment  ?Navigator Location [facility]  ?Referral Date to RadOnc/MedOnc   10/06/2021  ?Navigator Encounter Type Appt/Treatment Plan Review;Telephone Clinic/MDC;Initial MedOnc Initial MedOnc  ?Patient Visit Type Other MedOnc;Initial Initial  ?Treatment Phase  Pre-Tx/Tx Discussion Pre-Tx/Tx Discussion  ?Barriers/Navigation Needs Education;Coordination of Care/I called radiology to schedule patient's scans. They are schedule. I called daughter and updated her on the appts and pre-procedure instructions for pet scan.  Patient's prescriptions did not go to the pharmacy they want so Dr. Julien Nordmann changed the order to the pharmacy near [employer].  Dr. Julien Nordmann would like patient to come and get fluids today. I completed scheduling message for them to call today and get him scheduled. The daughter is aware she will get a call.  Education;Coordination of Care Coordination of Care  ?Education Other Newly Diagnosed Cancer Education;Understanding Cancer/ Treatment Options;Other   ?Interventions Coordination of Care Coordination of Care;Education;Psycho-Social Support Coordination of Care  ?Acuity Level 3-Moderate Needs (3-4 Barriers Identified) Level 3-Moderate Needs (3-4 Barriers Identified) Level 2-Minimal Needs (1-2 Barriers Identified)  ?Coordination of Care Appts Other Other  ?Education Method Verbal Verbal;Other   ?Time Spent with Patient 30 60 30  ?  ?

## 2021-10-18 ENCOUNTER — Encounter: Payer: Self-pay | Admitting: *Deleted

## 2021-10-18 ENCOUNTER — Telehealth: Payer: Self-pay | Admitting: *Deleted

## 2021-10-18 ENCOUNTER — Other Ambulatory Visit: Payer: Self-pay

## 2021-10-18 ENCOUNTER — Other Ambulatory Visit: Payer: Self-pay | Admitting: Internal Medicine

## 2021-10-18 ENCOUNTER — Inpatient Hospital Stay: Payer: Medicare Other

## 2021-10-18 VITALS — BP 114/54 | HR 74 | Temp 97.8°F | Resp 20

## 2021-10-18 DIAGNOSIS — C3490 Malignant neoplasm of unspecified part of unspecified bronchus or lung: Secondary | ICD-10-CM

## 2021-10-18 MED ORDER — OXYCODONE-ACETAMINOPHEN 5-325 MG PO TABS: 1.0000 | ORAL_TABLET | Freq: Three times a day (TID) | ORAL | 0 refills | Status: AC | PRN

## 2021-10-18 MED ORDER — MORPHINE SULFATE (PF) 2 MG/ML IV SOLN
2.0000 mg | Freq: Once | INTRAVENOUS | Status: AC
  Administered 2021-10-18: 2 mg via INTRAVENOUS
  Filled 2021-10-18: qty 1

## 2021-10-18 MED ORDER — SODIUM CHLORIDE 0.9 % IV SOLN: Freq: Once | INTRAVENOUS | Status: AC

## 2021-10-18 NOTE — Progress Notes (Signed)
Oncology Nurse Navigator Documentation ? ? ?  10/18/2021  ? 12:00 PM 10/17/2021  ? 10:00 AM 10/16/2021  ?  4:00 PM 10/06/2021  ? 12:00 PM  ?Oncology Nurse Navigator Flowsheets  ?Abnormal Finding Date 09/21/2021     ?Confirmed Diagnosis Date 09/25/2021     ?Diagnosis Status Confirmed Diagnosis Complete     ?Planned Course of Treatment Chemotherapy;Targeted Therapy     ?Phase of Treatment Targeted Therapy     ?Navigator Follow Up Date: 10/23/2021  10/18/2021 10/13/2021  ?Navigator Follow Up Reason: Appointment Review;Radiology  Appointment Review New Patient Appointment  ?Navigator Location [facility]  ?Referral Date to RadOnc/MedOnc    10/06/2021  ?Navigator Encounter Type Appt/Treatment Plan Review Appt/Treatment Plan Review;Telephone Clinic/MDC;Initial MedOnc Initial MedOnc  ?Patient Visit Type Other Other MedOnc;Initial Initial  ?Treatment Phase   Pre-Tx/Tx Discussion Pre-Tx/Tx Discussion  ?Barriers/Navigation Needs Coordination of Care/I followed up on Paul Mcneil treatment plan.  He still needs a third day of tx next week.  I reached out to [facility] for assistance.  Education;Coordination of Care Education;Coordination of Care Coordination of Care  ?Education  Other Newly Diagnosed Cancer Education;Understanding Cancer/ Treatment Options;Other   ?Interventions Coordination of Care Coordination of Care Coordination of Care;Education;Psycho-Social Support Coordination of Care  ?Acuity Level 2-Minimal Needs (1-2 Barriers Identified) Level 3-Moderate Needs (3-4 Barriers Identified) Level 3-Moderate Needs (3-4 Barriers Identified) Level 2-Minimal Needs (1-2 Barriers Identified)  ?Coordination of Care Other Appts Other Other  ?Education Method  Verbal Verbal;Other   ?Time Spent with Patient 30 30 60 30  ?  ?

## 2021-10-18 NOTE — Progress Notes (Signed)
Upon leaving infusion clinic, patient stated he felt worse than when he came in. Patient reported that he had relief with the 2mg  of morphine given. When offered if he wanted to stay longer and be further assessed, patient stated "I just want to go home." Vitals were assessed and were stable upon discharge, patient's daughter was informed of patient's upcoming appointments and his pain during his infusion visit.  ?

## 2021-10-18 NOTE — Progress Notes (Signed)
Oncology Nurse Navigator Documentation ? ? ?  10/18/2021  ?  2:00 PM 10/18/2021  ? 12:00 PM 10/17/2021  ? 10:00 AM 10/16/2021  ?  4:00 PM 10/06/2021  ? 12:00 PM  ?Oncology Nurse Navigator Flowsheets  ?Abnormal Finding Date  09/21/2021     ?Confirmed Diagnosis Date  09/25/2021     ?Diagnosis Status  Confirmed Diagnosis Complete     ?Planned Course of Treatment  Chemotherapy;Targeted Therapy     ?Phase of Treatment  Targeted Therapy     ?Navigator Follow Up Date:  10/23/2021  10/18/2021 10/13/2021  ?Navigator Follow Up Reason:  Appointment Review;Radiology  Appointment Review New Patient Appointment  ?Navigator [facility]  ?Referral Date to RadOnc/MedOnc     10/06/2021  ?Navigator Encounter Type Telephone Appt/Treatment Plan Review Appt/Treatment Plan Review;Telephone Clinic/MDC;Initial MedOnc Initial MedOnc  ?Telephone Outgoing Call      ?Patient Visit Type Other Other Other MedOnc;Initial Initial  ?Treatment Phase    Pre-Tx/Tx Discussion Pre-Tx/Tx Discussion  ?Barriers/Navigation Needs Coordination of Care;Education/I  received a message from [place] that patient has an appt on 4/3 and to call patient with an update. I called and spoke to daughter's husband and updated him on patient's appt on 4/3.   Coordination of Care Education;Coordination of Care Education;Coordination of Care Coordination of Care  ?Education   Other Newly Diagnosed Cancer Education;Understanding Cancer/ Treatment Options;Other   ?Interventions Coordination of Care;Education Coordination of Care Coordination of Care Coordination of Care;Education;Psycho-Social Support Coordination of Care  ?Acuity Level 3-Moderate Needs (3-4 Barriers Identified) Level 2-Minimal Needs (1-2 Barriers Identified) Level 3-Moderate Needs (3-4 Barriers Identified) Level 3-Moderate Needs (3-4 Barriers Identified) Level 2-Minimal Needs (1-2 Barriers Identified)  ?Coordination of Care Other Other Appts  Other Other  ?Education Method   Verbal Verbal;Other   ?Time Spent with Patient 45 30 30 60 30  ?  ?

## 2021-10-18 NOTE — Telephone Encounter (Signed)
I called and updated family on patient's appts  ?

## 2021-10-18 NOTE — Patient Instructions (Signed)

## 2021-10-19 ENCOUNTER — Other Ambulatory Visit: Payer: Self-pay

## 2021-10-19 DIAGNOSIS — C3411 Malignant neoplasm of upper lobe, right bronchus or lung: Secondary | ICD-10-CM

## 2021-10-20 ENCOUNTER — Inpatient Hospital Stay (HOSPITAL_COMMUNITY): Payer: Medicare Other

## 2021-10-20 ENCOUNTER — Inpatient Hospital Stay (HOSPITAL_COMMUNITY)
Admission: EM | Admit: 2021-10-20 | Discharge: 2021-11-20 | DRG: 682 | Disposition: E | Payer: Medicare Other | Source: Ambulatory Visit | Attending: Internal Medicine | Admitting: Internal Medicine

## 2021-10-20 ENCOUNTER — Encounter (HOSPITAL_COMMUNITY): Payer: Self-pay | Admitting: Emergency Medicine

## 2021-10-20 ENCOUNTER — Telehealth: Payer: Self-pay

## 2021-10-20 ENCOUNTER — Other Ambulatory Visit: Payer: Self-pay

## 2021-10-20 ENCOUNTER — Emergency Department (HOSPITAL_COMMUNITY): Payer: Medicare Other

## 2021-10-20 ENCOUNTER — Inpatient Hospital Stay: Payer: Medicare Other

## 2021-10-20 ENCOUNTER — Other Ambulatory Visit: Payer: Medicare Other

## 2021-10-20 DIAGNOSIS — E8721 Acute metabolic acidosis: Secondary | ICD-10-CM | POA: Diagnosis present

## 2021-10-20 DIAGNOSIS — C3411 Malignant neoplasm of upper lobe, right bronchus or lung: Secondary | ICD-10-CM

## 2021-10-20 DIAGNOSIS — R918 Other nonspecific abnormal finding of lung field: Secondary | ICD-10-CM | POA: Diagnosis not present

## 2021-10-20 DIAGNOSIS — K219 Gastro-esophageal reflux disease without esophagitis: Secondary | ICD-10-CM | POA: Diagnosis present

## 2021-10-20 DIAGNOSIS — I6521 Occlusion and stenosis of right carotid artery: Secondary | ICD-10-CM | POA: Diagnosis present

## 2021-10-20 DIAGNOSIS — R17 Unspecified jaundice: Secondary | ICD-10-CM | POA: Diagnosis not present

## 2021-10-20 DIAGNOSIS — E440 Moderate protein-calorie malnutrition: Secondary | ICD-10-CM | POA: Diagnosis present

## 2021-10-20 DIAGNOSIS — E1122 Type 2 diabetes mellitus with diabetic chronic kidney disease: Secondary | ICD-10-CM | POA: Diagnosis present

## 2021-10-20 DIAGNOSIS — Z951 Presence of aortocoronary bypass graft: Secondary | ICD-10-CM

## 2021-10-20 DIAGNOSIS — E8809 Other disorders of plasma-protein metabolism, not elsewhere classified: Secondary | ICD-10-CM | POA: Diagnosis present

## 2021-10-20 DIAGNOSIS — I10 Essential (primary) hypertension: Secondary | ICD-10-CM | POA: Diagnosis present

## 2021-10-20 DIAGNOSIS — K5730 Diverticulosis of large intestine without perforation or abscess without bleeding: Secondary | ICD-10-CM | POA: Diagnosis not present

## 2021-10-20 DIAGNOSIS — N189 Chronic kidney disease, unspecified: Secondary | ICD-10-CM | POA: Diagnosis not present

## 2021-10-20 DIAGNOSIS — Z8261 Family history of arthritis: Secondary | ICD-10-CM

## 2021-10-20 DIAGNOSIS — J439 Emphysema, unspecified: Secondary | ICD-10-CM | POA: Diagnosis present

## 2021-10-20 DIAGNOSIS — Z66 Do not resuscitate: Secondary | ICD-10-CM | POA: Diagnosis present

## 2021-10-20 DIAGNOSIS — N179 Acute kidney failure, unspecified: Principal | ICD-10-CM | POA: Diagnosis present

## 2021-10-20 DIAGNOSIS — R7989 Other specified abnormal findings of blood chemistry: Secondary | ICD-10-CM | POA: Diagnosis not present

## 2021-10-20 DIAGNOSIS — E1169 Type 2 diabetes mellitus with other specified complication: Secondary | ICD-10-CM | POA: Diagnosis present

## 2021-10-20 DIAGNOSIS — Z79899 Other long term (current) drug therapy: Secondary | ICD-10-CM

## 2021-10-20 DIAGNOSIS — Z515 Encounter for palliative care: Secondary | ICD-10-CM | POA: Diagnosis not present

## 2021-10-20 DIAGNOSIS — Z881 Allergy status to other antibiotic agents status: Secondary | ICD-10-CM

## 2021-10-20 DIAGNOSIS — R0602 Shortness of breath: Secondary | ICD-10-CM | POA: Diagnosis not present

## 2021-10-20 DIAGNOSIS — R32 Unspecified urinary incontinence: Secondary | ICD-10-CM | POA: Diagnosis present

## 2021-10-20 DIAGNOSIS — C787 Secondary malignant neoplasm of liver and intrahepatic bile duct: Secondary | ICD-10-CM | POA: Diagnosis present

## 2021-10-20 DIAGNOSIS — C7951 Secondary malignant neoplasm of bone: Secondary | ICD-10-CM | POA: Diagnosis present

## 2021-10-20 DIAGNOSIS — D631 Anemia in chronic kidney disease: Secondary | ICD-10-CM | POA: Diagnosis present

## 2021-10-20 DIAGNOSIS — Z6826 Body mass index (BMI) 26.0-26.9, adult: Secondary | ICD-10-CM

## 2021-10-20 DIAGNOSIS — I2510 Atherosclerotic heart disease of native coronary artery without angina pectoris: Secondary | ICD-10-CM | POA: Diagnosis present

## 2021-10-20 DIAGNOSIS — I679 Cerebrovascular disease, unspecified: Secondary | ICD-10-CM | POA: Diagnosis present

## 2021-10-20 DIAGNOSIS — E785 Hyperlipidemia, unspecified: Secondary | ICD-10-CM | POA: Diagnosis present

## 2021-10-20 DIAGNOSIS — C3490 Malignant neoplasm of unspecified part of unspecified bronchus or lung: Secondary | ICD-10-CM

## 2021-10-20 DIAGNOSIS — N184 Chronic kidney disease, stage 4 (severe): Secondary | ICD-10-CM | POA: Diagnosis present

## 2021-10-20 DIAGNOSIS — I129 Hypertensive chronic kidney disease with stage 1 through stage 4 chronic kidney disease, or unspecified chronic kidney disease: Secondary | ICD-10-CM | POA: Diagnosis present

## 2021-10-20 DIAGNOSIS — Z7189 Other specified counseling: Secondary | ICD-10-CM

## 2021-10-20 DIAGNOSIS — Z9049 Acquired absence of other specified parts of digestive tract: Secondary | ICD-10-CM

## 2021-10-20 DIAGNOSIS — K6289 Other specified diseases of anus and rectum: Secondary | ICD-10-CM | POA: Diagnosis not present

## 2021-10-20 DIAGNOSIS — Z808 Family history of malignant neoplasm of other organs or systems: Secondary | ICD-10-CM

## 2021-10-20 DIAGNOSIS — E872 Acidosis, unspecified: Secondary | ICD-10-CM | POA: Diagnosis not present

## 2021-10-20 DIAGNOSIS — G893 Neoplasm related pain (acute) (chronic): Secondary | ICD-10-CM | POA: Diagnosis present

## 2021-10-20 DIAGNOSIS — Z7982 Long term (current) use of aspirin: Secondary | ICD-10-CM

## 2021-10-20 DIAGNOSIS — Z87891 Personal history of nicotine dependence: Secondary | ICD-10-CM

## 2021-10-20 DIAGNOSIS — N281 Cyst of kidney, acquired: Secondary | ICD-10-CM | POA: Diagnosis not present

## 2021-10-20 DIAGNOSIS — R160 Hepatomegaly, not elsewhere classified: Secondary | ICD-10-CM | POA: Diagnosis not present

## 2021-10-20 DIAGNOSIS — J189 Pneumonia, unspecified organism: Secondary | ICD-10-CM | POA: Diagnosis present

## 2021-10-20 DIAGNOSIS — D649 Anemia, unspecified: Secondary | ICD-10-CM | POA: Diagnosis not present

## 2021-10-20 DIAGNOSIS — N400 Enlarged prostate without lower urinary tract symptoms: Secondary | ICD-10-CM | POA: Diagnosis not present

## 2021-10-20 DIAGNOSIS — Z794 Long term (current) use of insulin: Secondary | ICD-10-CM

## 2021-10-20 DIAGNOSIS — K644 Residual hemorrhoidal skin tags: Secondary | ICD-10-CM | POA: Diagnosis present

## 2021-10-20 DIAGNOSIS — K7689 Other specified diseases of liver: Secondary | ICD-10-CM | POA: Diagnosis not present

## 2021-10-20 LAB — CBC WITH DIFFERENTIAL/PLATELET
Abs Immature Granulocytes: 0.13 10*3/uL — ABNORMAL HIGH (ref 0.00–0.07)
Basophils Absolute: 0 10*3/uL (ref 0.0–0.1)
Basophils Relative: 0 %
Eosinophils Absolute: 0 10*3/uL (ref 0.0–0.5)
Eosinophils Relative: 0 %
HCT: 36.3 % — ABNORMAL LOW (ref 39.0–52.0)
Hemoglobin: 11.6 g/dL — ABNORMAL LOW (ref 13.0–17.0)
Immature Granulocytes: 2 %
Lymphocytes Relative: 5 %
Lymphs Abs: 0.5 10*3/uL — ABNORMAL LOW (ref 0.7–4.0)
MCH: 29.2 pg (ref 26.0–34.0)
MCHC: 32 g/dL (ref 30.0–36.0)
MCV: 91.4 fL (ref 80.0–100.0)
Monocytes Absolute: 0.5 10*3/uL (ref 0.1–1.0)
Monocytes Relative: 5 %
Neutro Abs: 7.7 10*3/uL (ref 1.7–7.7)
Neutrophils Relative %: 88 %
Platelets: 133 10*3/uL — ABNORMAL LOW (ref 150–400)
RBC: 3.97 MIL/uL — ABNORMAL LOW (ref 4.22–5.81)
RDW: 19.9 % — ABNORMAL HIGH (ref 11.5–15.5)
WBC: 8.8 10*3/uL (ref 4.0–10.5)
nRBC: 0 % (ref 0.0–0.2)

## 2021-10-20 LAB — URINALYSIS, ROUTINE W REFLEX MICROSCOPIC
Bilirubin Urine: NEGATIVE
Bilirubin Urine: NEGATIVE
Glucose, UA: NEGATIVE mg/dL
Glucose, UA: NEGATIVE mg/dL
Ketones, ur: 5 mg/dL — AB
Ketones, ur: 5 mg/dL — AB
Leukocytes,Ua: NEGATIVE
Leukocytes,Ua: NEGATIVE
Nitrite: NEGATIVE
Nitrite: NEGATIVE
Protein, ur: 100 mg/dL — AB
Protein, ur: 100 mg/dL — AB
Specific Gravity, Urine: 1.014 (ref 1.005–1.030)
Specific Gravity, Urine: 1.014 (ref 1.005–1.030)
pH: 5 (ref 5.0–8.0)
pH: 5 (ref 5.0–8.0)

## 2021-10-20 LAB — CMP (CANCER CENTER ONLY)
ALT: 112 U/L — ABNORMAL HIGH (ref 0–44)
AST: 322 U/L (ref 15–41)
Albumin: 2.9 g/dL — ABNORMAL LOW (ref 3.5–5.0)
Alkaline Phosphatase: 612 U/L — ABNORMAL HIGH (ref 38–126)
Anion gap: 27 — ABNORMAL HIGH (ref 5–15)
BUN: 121 mg/dL — ABNORMAL HIGH (ref 8–23)
CO2: 13 mmol/L — ABNORMAL LOW (ref 22–32)
Calcium: 8.6 mg/dL — ABNORMAL LOW (ref 8.9–10.3)
Chloride: 99 mmol/L (ref 98–111)
Creatinine: 9.28 mg/dL (ref 0.61–1.24)
GFR, Estimated: 5 mL/min — ABNORMAL LOW (ref >60)
Glucose, Bld: 62 mg/dL — ABNORMAL LOW (ref 70–99)
Potassium: 3.9 mmol/L (ref 3.5–5.1)
Sodium: 139 mmol/L (ref 135–145)
Total Bilirubin: 4.1 mg/dL (ref 0.3–1.2)
Total Protein: 5.7 g/dL — ABNORMAL LOW (ref 6.5–8.1)

## 2021-10-20 LAB — COMPREHENSIVE METABOLIC PANEL
ALT: 121 U/L — ABNORMAL HIGH (ref 0–44)
AST: 360 U/L — ABNORMAL HIGH (ref 15–41)
Albumin: 2.3 g/dL — ABNORMAL LOW (ref 3.5–5.0)
Alkaline Phosphatase: 612 U/L — ABNORMAL HIGH (ref 38–126)
Anion gap: 28 — ABNORMAL HIGH (ref 5–15)
BUN: 122 mg/dL — ABNORMAL HIGH (ref 8–23)
CO2: 11 mmol/L — ABNORMAL LOW (ref 22–32)
Calcium: 8.7 mg/dL — ABNORMAL LOW (ref 8.9–10.3)
Chloride: 99 mmol/L (ref 98–111)
Creatinine, Ser: 9.72 mg/dL — ABNORMAL HIGH (ref 0.61–1.24)
GFR, Estimated: 5 mL/min — ABNORMAL LOW (ref >60)
Glucose, Bld: 60 mg/dL — ABNORMAL LOW (ref 70–99)
Potassium: 4 mmol/L (ref 3.5–5.1)
Sodium: 138 mmol/L (ref 135–145)
Total Bilirubin: 4.3 mg/dL — ABNORMAL HIGH (ref 0.3–1.2)
Total Protein: 5.7 g/dL — ABNORMAL LOW (ref 6.5–8.1)

## 2021-10-20 LAB — CBC WITH DIFFERENTIAL (CANCER CENTER ONLY)
Abs Immature Granulocytes: 0.15 10*3/uL — ABNORMAL HIGH (ref 0.00–0.07)
Basophils Absolute: 0 10*3/uL (ref 0.0–0.1)
Basophils Relative: 0 %
Eosinophils Absolute: 0 10*3/uL (ref 0.0–0.5)
Eosinophils Relative: 0 %
HCT: 34.2 % — ABNORMAL LOW (ref 39.0–52.0)
Hemoglobin: 11.2 g/dL — ABNORMAL LOW (ref 13.0–17.0)
Immature Granulocytes: 2 %
Lymphocytes Relative: 7 %
Lymphs Abs: 0.6 10*3/uL — ABNORMAL LOW (ref 0.7–4.0)
MCH: 28.6 pg (ref 26.0–34.0)
MCHC: 32.7 g/dL (ref 30.0–36.0)
MCV: 87.5 fL (ref 80.0–100.0)
Monocytes Absolute: 0.6 10*3/uL (ref 0.1–1.0)
Monocytes Relative: 6 %
Neutro Abs: 8.2 10*3/uL — ABNORMAL HIGH (ref 1.7–7.7)
Neutrophils Relative %: 85 %
Platelet Count: 130 10*3/uL — ABNORMAL LOW (ref 150–400)
RBC: 3.91 MIL/uL — ABNORMAL LOW (ref 4.22–5.81)
RDW: 19.9 % — ABNORMAL HIGH (ref 11.5–15.5)
WBC Count: 9.6 10*3/uL (ref 4.0–10.5)
nRBC: 0 % (ref 0.0–0.2)

## 2021-10-20 LAB — PROTIME-INR
INR: 1.5 — ABNORMAL HIGH (ref 0.8–1.2)
Prothrombin Time: 17.9 seconds — ABNORMAL HIGH (ref 11.4–15.2)

## 2021-10-20 LAB — SODIUM, URINE, RANDOM: Sodium, Ur: 18 mmol/L

## 2021-10-20 LAB — BRAIN NATRIURETIC PEPTIDE: B Natriuretic Peptide: 746.7 pg/mL — ABNORMAL HIGH (ref 0.0–100.0)

## 2021-10-20 LAB — CREATININE, URINE, RANDOM: Creatinine, Urine: 70.24 mg/dL

## 2021-10-20 MED ORDER — ONDANSETRON HCL 4 MG/2ML IJ SOLN: 4.0000 mg | Freq: Four times a day (QID) | INTRAMUSCULAR | Status: DC | PRN

## 2021-10-20 MED ORDER — FENTANYL CITRATE PF 50 MCG/ML IJ SOSY
50.0000 ug | PREFILLED_SYRINGE | Freq: Once | INTRAMUSCULAR | Status: AC
  Administered 2021-10-20: 50 ug via INTRAVENOUS
  Filled 2021-10-20: qty 1

## 2021-10-20 MED ORDER — ACETAMINOPHEN 650 MG RE SUPP
650.0000 mg | Freq: Four times a day (QID) | RECTAL | Status: DC | PRN
Start: 2021-10-20 — End: 2021-10-22

## 2021-10-20 MED ORDER — ONDANSETRON HCL 4 MG/2ML IJ SOLN
4.0000 mg | Freq: Once | INTRAMUSCULAR | Status: AC
  Administered 2021-10-20: 4 mg via INTRAVENOUS
  Filled 2021-10-20: qty 2

## 2021-10-20 MED ORDER — VITAMIN D 25 MCG (1000 UNIT) PO TABS
4000.0000 [IU] | ORAL_TABLET | Freq: Every day | ORAL | Status: DC
  Administered 2021-10-20 – 2021-10-21 (×2): 4000 [IU] via ORAL
  Filled 2021-10-20 (×3): qty 4

## 2021-10-20 MED ORDER — OXYCODONE-ACETAMINOPHEN 5-325 MG PO TABS
1.0000 | ORAL_TABLET | Freq: Three times a day (TID) | ORAL | Status: DC | PRN
  Administered 2021-10-21: 1 via ORAL
  Filled 2021-10-20: qty 1

## 2021-10-20 MED ORDER — HYDROMORPHONE HCL 1 MG/ML IJ SOLN
1.0000 mg | INTRAMUSCULAR | Status: DC | PRN
  Administered 2021-10-20 – 2021-10-21 (×2): 1 mg via INTRAVENOUS
  Filled 2021-10-20 (×2): qty 1

## 2021-10-20 MED ORDER — ONDANSETRON HCL 4 MG PO TABS: 4.0000 mg | ORAL_TABLET | Freq: Four times a day (QID) | ORAL | Status: DC | PRN

## 2021-10-20 MED ORDER — HEPARIN SODIUM (PORCINE) 5000 UNIT/ML IJ SOLN
5000.0000 [IU] | Freq: Three times a day (TID) | INTRAMUSCULAR | Status: DC
  Administered 2021-10-20 – 2021-10-21 (×2): 5000 [IU] via SUBCUTANEOUS
  Filled 2021-10-20 (×2): qty 1

## 2021-10-20 MED ORDER — LACTOBACILLUS CASEI-FOLIC ACID 60-1.25 MG PO CAPS: 1.0000 | ORAL_CAPSULE | Freq: Every day | ORAL | Status: DC | PRN

## 2021-10-20 MED ORDER — HYDRALAZINE HCL 20 MG/ML IJ SOLN: 5.0000 mg | Freq: Four times a day (QID) | INTRAMUSCULAR | Status: DC | PRN

## 2021-10-20 MED ORDER — OMEGA-3-ACID ETHYL ESTERS 1 G PO CAPS
2.0000 g | ORAL_CAPSULE | Freq: Every day | ORAL | Status: DC
  Administered 2021-10-21: 2 g via ORAL
  Filled 2021-10-20 (×2): qty 2

## 2021-10-20 MED ORDER — BISOPROLOL FUMARATE 5 MG PO TABS
10.0000 mg | ORAL_TABLET | Freq: Every day | ORAL | Status: DC
Start: 2021-10-20 — End: 2021-10-22
  Administered 2021-10-21: 10 mg via ORAL
  Filled 2021-10-20: qty 1
  Filled 2021-10-20: qty 2

## 2021-10-20 MED ORDER — ACETAMINOPHEN 325 MG PO TABS: 650.0000 mg | ORAL_TABLET | Freq: Four times a day (QID) | ORAL | Status: DC | PRN

## 2021-10-20 MED ORDER — PANTOPRAZOLE SODIUM 40 MG PO TBEC
40.0000 mg | DELAYED_RELEASE_TABLET | Freq: Every day | ORAL | Status: DC
  Administered 2021-10-21: 40 mg via ORAL
  Filled 2021-10-20 (×2): qty 1

## 2021-10-20 MED ORDER — MOMETASONE FURO-FORMOTEROL FUM 200-5 MCG/ACT IN AERO
2.0000 | INHALATION_SPRAY | Freq: Two times a day (BID) | RESPIRATORY_TRACT | Status: DC
  Administered 2021-10-21: 2 via RESPIRATORY_TRACT
  Filled 2021-10-20: qty 8.8

## 2021-10-20 MED ORDER — ASPIRIN EC 81 MG PO TBEC
81.0000 mg | DELAYED_RELEASE_TABLET | Freq: Every day | ORAL | Status: DC
  Administered 2021-10-20 – 2021-10-21 (×2): 81 mg via ORAL
  Filled 2021-10-20 (×2): qty 1

## 2021-10-20 MED ORDER — SODIUM BICARBONATE 8.4 % IV SOLN
INTRAVENOUS | Status: DC
  Filled 2021-10-20: qty 1000

## 2021-10-20 MED ORDER — SORBITOL 70 % SOLN: 30.0000 mL | Freq: Every day | Status: DC | PRN

## 2021-10-20 MED ORDER — SODIUM CHLORIDE 0.9% FLUSH
3.0000 mL | Freq: Two times a day (BID) | INTRAVENOUS | Status: DC
  Administered 2021-10-20: 3 mL via INTRAVENOUS

## 2021-10-20 MED ORDER — IPRATROPIUM-ALBUTEROL 0.5-2.5 (3) MG/3ML IN SOLN
3.0000 mL | RESPIRATORY_TRACT | Status: DC | PRN
  Administered 2021-10-21: 3 mL via RESPIRATORY_TRACT
  Filled 2021-10-20: qty 3

## 2021-10-20 MED ORDER — POLYETHYLENE GLYCOL 3350 17 G PO PACK
17.0000 g | PACK | Freq: Every day | ORAL | Status: DC
  Administered 2021-10-20 – 2021-10-21 (×2): 17 g via ORAL
  Filled 2021-10-20 (×2): qty 1

## 2021-10-20 MED ORDER — ENSURE ENLIVE PO LIQD
237.0000 mL | Freq: Two times a day (BID) | ORAL | Status: DC
  Administered 2021-10-21: 237 mL via ORAL

## 2021-10-20 MED ORDER — UMECLIDINIUM BROMIDE 62.5 MCG/ACT IN AEPB
1.0000 | INHALATION_SPRAY | Freq: Every day | RESPIRATORY_TRACT | Status: DC
  Administered 2021-10-21: 1 via RESPIRATORY_TRACT
  Filled 2021-10-20: qty 7

## 2021-10-20 MED ORDER — ROSUVASTATIN CALCIUM 20 MG PO TABS
40.0000 mg | ORAL_TABLET | Freq: Every day | ORAL | Status: DC
  Administered 2021-10-20 – 2021-10-21 (×2): 40 mg via ORAL
  Filled 2021-10-20 (×2): qty 2

## 2021-10-20 MED ORDER — HYDROCORTISONE (PERIANAL) 2.5 % EX CREA
TOPICAL_CREAM | Freq: Four times a day (QID) | CUTANEOUS | Status: DC
  Filled 2021-10-20: qty 28.35

## 2021-10-20 MED ORDER — RISAQUAD PO CAPS: 1.0000 | ORAL_CAPSULE | Freq: Every day | ORAL | Status: DC | PRN

## 2021-10-20 MED ORDER — NEPRO/CARBSTEADY PO LIQD
237.0000 mL | Freq: Two times a day (BID) | ORAL | Status: DC
Start: 2021-10-21 — End: 2021-10-21
  Administered 2021-10-21: 237 mL via ORAL

## 2021-10-20 MED FILL — Fosaprepitant Dimeglumine For IV Infusion 150 MG (Base Eq): INTRAVENOUS | Qty: 5 | Status: AC

## 2021-10-20 MED FILL — Dexamethasone Sodium Phosphate Inj 100 MG/10ML: INTRAMUSCULAR | Qty: 1 | Status: AC

## 2021-10-20 NOTE — ED Notes (Signed)
ED TO INPATIENT HANDOFF REPORT ? ?ED Nurse Name and Phone #: Katie Moch RN [phone] ? ?S ?Name/Age/Gender ?Paul Mcneil ?76 y.o. ?male ?Room/Bed: 026C/026C ? ?Code Status ?  Code Status: Prior ? ?Home/SNF/Other ?Home ?Patient oriented to: self, place, time, and situation ?Is this baseline? Yes  ? ?Triage Complete: Triage complete  ?Chief Complaint ?ARF (acute renal failure) ([place]) [N17.9] ? ?Triage Note ?Patient sent to ED from cancer center for evaluation of worsening kidney failure, reported to be creatinine 9.7. Also complains of lower extremity edema and rectal pain. Patient recently diagnosed with lung cancer, plan to start chemotherapy next week.  ? ?Allergies ?Allergies  ?Allergen Reactions  ? Ceclor [Cefaclor] Anaphylaxis, Hives and Other (See Comments)  ?  BASED ON CRITERIA FOR ANAPHYLAXIS ?HIVES + HYPOTENSION  ? ? ?Level of Care/Admitting Diagnosis ?ED Disposition   ? ? ED Disposition  ?Admit  ? Condition  ?--  ? Comment  ?Hospital Area: [facility] [[id number]] ? Level of Care: Progressive [102] ? Admit to Progressive based on following criteria: NEPHROLOGY stable condition requiring close monitoring for AKI, requiring Hemodialysis or Peritoneal Dialysis either from expected electrolyte imbalance, acidosis, or fluid overload that can be managed by NIPPV or high flow oxygen. ? Admit to Progressive based on following criteria: RESPIRATORY PROBLEMS hypoxemic/hypercapnic respiratory failure that is responsive to NIPPV (BiPAP) or High Flow Nasal Cannula (6-80 lpm). Frequent assessment/intervention, no > Q2 hrs < Q4 hrs, to maintain oxygenation and pulmonary hygiene. ? May admit patient to [facility] or [facility] if equivalent level of care is available:: No ? Covid Evaluation: Asymptomatic - no recent exposure (last 10 days) testing not required ? Diagnosis: ARF (acute renal failure) ([place]) [[id number]] ? Admitting Physician: Eugenie Filler [[id number]] ? Attending Physician: Eugenie Filler  [[id number]] ? Estimated length of stay: past midnight tomorrow ? Certification:: I certify this patient will need inpatient services for at least 2 midnights ?  ?  ? ?  ? ? ?B ?Medical/Surgery History ?Past Medical History:  ?Diagnosis Date  ? Anemia 1964  ? Arthritis   ? "touch starting to show up in the joints" (12/11/2013)  ? CAD (coronary artery disease), native coronary artery   ? a. Cath 01/30/17 showing multivessel CAD --> underwent CABG on 02/15/2017 with LIMA-LAD, SVG-PDA, and SVG-Ramus-OM1.   ? Carotid artery calcification   ? a. 01/2017: s/p L CEA performed at time of CABG; Existing Mod-Severe R Carotid stenosis  ? Chronic kidney disease, stage IV (severe) ([place])   ? "my kidneys are down to 30% function"; nephrologist-Dr. Carolin Sicks  ? COPD (chronic obstructive pulmonary disease) ([place])   ? "they said I had it"  ? Diabetes ([place])   ? Dyspnea   ? with exertion  ? Former heavy tobacco smoker   ? GERD (gastroesophageal reflux disease)   ? Dr. Michail Sermon  ? History of alcohol abuse   ? 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 LIMA-LAD, SVG-PDA, SVG-RI-OM 1  ? Hyperlipemia   ? ?Past Surgical History:  ?Procedure Laterality Date  ? APPENDECTOMY  12/11/2013  ? "laparoscopic"  ? BRONCHOSCOPY    ? CHOLECYSTECTOMY  ~ 2010  ? CORONARY ARTERY BYPASS GRAFT N/A 02/15/2017  ? Procedure: CORONARY ARTERY BYPASS GRAFTING (CABG) x4 :LAD to  LIMA, SVG to PDA , Sequential SVG to OM1 and Ramus;  Surgeon: Melrose Nakayama, MD;  Location: [place];  Service: Open Heart Surgery;  Laterality: N/A;  ? ENDARTERECTOMY Left 02/15/2017  ? Procedure: LEFT  ENDARTERECTOMY CAROTID;  Surgeon: Angelia Mould, MD;  Location: [facility] OR;  Service: Vascular;  Laterality: Left;  ? ENDARTERECTOMY Right 03/04/2018  ? Procedure: ENDARTERECTOMY CAROTID RIGHT WITH PRIMARY CLOSURE OF ARTERY;  Surgeon: Angelia Mould, MD;  Location: [facility];  Service: Vascular;  Laterality: Right;  ? LAPAROSCOPIC APPENDECTOMY N/A 12/11/2013  ? Procedure: APPENDECTOMY LAPAROSCOPIC;   Surgeon: Imogene Burn. Georgette Dover, MD;  Location: [facility];  Service: General;  Laterality: N/A;  ? RIGHT/LEFT HEART CATH AND CORONARY ANGIOGRAPHY N/A 01/30/2017  ? Procedure: Right/Left Heart Cath and Coronary Angiography;  Surgeon: Troy Sine, MD;  Location: [facility];  Service: Cardiovascular: Normal RHC pressures.  Significant multivessel CAD with 50-60% smooth d-LM, 40% mid LAD. 50% mid LAD stenoses; RI --70% ostial & 80% prox RI; 90% pCx w/  80% and 70% distal; 80% oxt-prox & 90% prox-mid -> 100% CTO distal mid RCA w/ L-R collaterals  ? TEE WITHOUT CARDIOVERSION N/A 02/15/2017  ? Procedure: TRANSESOPHAGEAL ECHOCARDIOGRAM (TEE);  Surgeon: Melrose Nakayama, MD;  Location: [facility];  Service: Open Heart Surgery: Vigorous wall motion.  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  Mild MR noted.  ? TONSILLECTOMY AND ADENOIDECTOMY  1950's  ? TRANSTHORACIC ECHOCARDIOGRAM  01/2017  ? 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  Otherwise normal cavity size.  Mild LVH.  EF 60-65%.  GR 1 DD.  Mild MR.  ?  ? ?A ?IV Location/Drains/Wounds ?Patient Lines/Drains/Airways Status   ? ? Active Line/Drains/Airways   ? ? Name Placement date Placement time Site Days  ? Peripheral IV 10/08/2021 20 G Right Antecubital 09/27/2021  1350  Antecubital  less than 1  ? Incision (Closed) 02/15/17 Chest Other (Comment);Medial;Mid 02/15/17  1056  -- 1708  ? Incision (Closed) 02/15/17 Leg Right 02/15/17  1056  -- 1708  ? Incision (Closed) 02/15/17 Neck Left 02/15/17  1056  -- 1708  ? Incision (Closed) 03/04/18 Neck Right 03/04/18  0706  -- 1326  ? ?  ?  ? ?  ? ? ?Intake/Output Last 24 hours ?No intake or output data in the 24 hours ending 10/17/2021 1608 ? ?Labs/Imaging ?Results for orders placed or performed during the hospital encounter of 09/26/2021 (from the past 48 hour(s))  ?Brain natriuretic peptide     Status: Abnormal  ? Collection Time: 10/13/2021 12:29 PM  ?Result Value Ref  Range  ? B Natriuretic Peptide 746.7 (H) 0.0 - 100.0 pg/mL  ?  Comment: Performed at [facility], [place] [address] [place], [place] [address]  ?Urinalysis, Routine w reflex microscopic     Status: Abnormal  ? Collection Time: 09/26/2021  1:00 PM  ?Result Value Ref Range  ? Color, Urine AMBER (A) YELLOW  ?  Comment: BIOCHEMICALS MAY BE AFFECTED BY COLOR  ? APPearance CLOUDY (A) CLEAR  ? Specific Gravity, Urine 1.014 1.005 - 1.030  ? pH 5.0 5.0 - 8.0  ? Glucose, UA NEGATIVE NEGATIVE mg/dL  ? Hgb urine dipstick LARGE (A) NEGATIVE  ? Bilirubin Urine NEGATIVE NEGATIVE  ? Ketones, ur 5 (A) NEGATIVE mg/dL  ? Protein, ur 100 (A) NEGATIVE mg/dL  ? Nitrite NEGATIVE NEGATIVE  ? Leukocytes,Ua NEGATIVE NEGATIVE  ? RBC / HPF 6-10 0 - 5 RBC/hpf  ? WBC, UA 6-10 0 - 5 WBC/hpf  ? Bacteria, UA FEW (A) NONE SEEN  ? Squamous Epithelial / LPF 0-5 0 - 5  ? Granular Casts, UA PRESENT   ?  Amorphous Crystal PRESENT   ?  Comment: Performed at [facility], [place] [address] [place], [place] [address]  ?Comprehensive metabolic panel     Status: Abnormal  ? Collection Time: 09/24/2021  1:43 PM  ?Result Value Ref Range  ? Sodium 138 135 - 145 mmol/L  ? Potassium 4.0 3.5 - 5.1 mmol/L  ? Chloride 99 98 - 111 mmol/L  ? CO2 11 (L) 22 - 32 mmol/L  ? Glucose, Bld 60 (L) 70 - 99 mg/dL  ?  Comment: Glucose reference range applies only to samples taken after fasting for at least 8 hours.  ? BUN 122 (H) 8 - 23 mg/dL  ? Creatinine, Ser 9.72 (H) 0.61 - 1.24 mg/dL  ? Calcium 8.7 (L) 8.9 - 10.3 mg/dL  ? Total Protein 5.7 (L) 6.5 - 8.1 g/dL  ? Albumin 2.3 (L) 3.5 - 5.0 g/dL  ? AST 360 (H) 15 - 41 U/L  ? ALT 121 (H) 0 - 44 U/L  ? Alkaline Phosphatase 612 (H) 38 - 126 U/L  ? Total Bilirubin 4.3 (H) 0.3 - 1.2 mg/dL  ? GFR, Estimated 5 (L) >60 mL/min  ?  Comment: (NOTE) ?Calculated using the CKD-EPI Creatinine Equation (2021) ?  ? Anion gap 28 (H) 5 - 15  ?  Comment: Electrolytes repeated to confirm. ?Performed at [facility], [place] [address],  [place], [place] [address] ?  ?CBC with Differential     Status: Abnormal  ? Collection Time: 10/07/2021  1:43 PM  ?Result Value Ref Range  ? WBC 8.8 4.0 - 10.5 K/uL  ? RBC 3.97 (L) 4.22 - 5.81 MIL/uL  ? Hemoglobin 11.6 (L) 1

## 2021-10-20 NOTE — Assessment & Plan Note (Addendum)
-   Palliative care has assessed patient and patient transitioned to full comfort measures.  ?

## 2021-10-20 NOTE — Consult Note (Signed)
?Consultation Note ?Date: 10/03/2021  ? ?Patient Name: Paul Mcneil  ?DOB: 11-07-1945  MRN: [id number]  Age / Sex: 76 y.o., male  ?PCP: [name], MD ?Referring Physician: Eugenie Filler, MD ? ?Reason for Consultation: Establishing goals of care ? ?HPI/Patient Profile: 76 y.o. male  with past medical history of chronic kidney disease stage IV, CAD status post CABG in 2018, COPD, and diabetes. 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  He is being followed by oncology and was getting ready to start palliative chemotherapy on 10/21/2021.  He presented to the emergency department on 10/11/2021 with abnormal labs from the cancer center indicating worsening renal function.  Repeat labs in the ED showed creatinine of 9.72.   ?He is admitted to the hospitalist service with multiple issues including acute renal failure superimposed on stage IV chronic kidney disease, acute metabolic acidosis, malnutrition, elevated LFTs, and rectal pain.  ? ? ?Clinical Assessment and Goals of Care: ?I have reviewed medical records including EPIC notes, labs and imaging, and met at bedside with patient to discuss diagnosis, prognosis, GOC, EOL wishes, disposition, and options. ? ?I introduced Palliative Medicine as specialized medical care for people living with serious illness. It focuses on providing relief from the symptoms and stress of a serious illness. ? ?Patient is currently in a great deal of pain.  He reports rectal pain as well as pain in his chest.  I note that he received IV fentanyl while in the emergency department.  Patient states this "did not touch it". ? ?Regarding understanding of his illness, patient tells me he has "terminal cancer ".  He also tells me "I don't think I am going to make it out of here".  He verbalized understanding that he has severe kidney failure and would  not be a good candidate for dialysis.  Patient is in such significant pain, that we are unable to have further meaningful discussion regarding goals of care at this time. ? ?I later spoke with his daughter Juventino Slovak by phone and introduced the role of palliative medicine. ? ?We discussed a brief life review of the patient.  He is originally from [place].  He is divorced.  He is a retired Engineer, structural.  He relocated to [place] in 2009 after retirement to be closer to his daughter. ? ?He and [place]ne live beside each other in a duplex.  As far as functional and nutritional status, there has been a significant decline over the last week.  He has been much weaker, and needing assistance with ADLs.  Had increasing difficulty with ambulation and intermittent episodes of incontinence. ? ?We discussed patient's current illness and what it means in the larger context of his ongoing co-morbidities. I provided education on the natural disease trajectory of advanced cancer, emphasizing that functional status is generally preserved until late in the disease course, followed by a precipitous decline over weeks to months.  ? ?Adrienne asked me if her father is "end-stage ".  We discussed that he is very high risk for acute decline and that his current condition may not be reversible.  Minna Merritts expresses concern that her father will not be able to tolerate palliative chemotherapy at this point. Adrianne tells me that if he is in fact "end-stage", then she would just want him to be comfortable. ? ?The difference between full scope medical intervention and comfort care was considered.  I introduced the concept of a comfort  path to family, emphasizing that this path involves de-escalating and stopping full scope medical interventions, allowing a natural course to occur. Discussed that the goal is comfort and dignity rather than cure/prolonging life. Introduced hospice philosophy and provided information on home vs  residential hospice services - answered all questions.  ? ?Questions and concerns were addressed.  The family was encouraged to call with questions or concerns.  ? ? ?Primary decision maker: Patient ?  ? ?SUMMARY OF RECOMMENDATIONS   ?DNR/DNI as previously documented ?Continue all current interventions ?PMT will meet with patient and daughter tomorrow at 10:30 AM ? ? ?Symptom Management:  ?Dilaudid 1 mg IV every 3 hours as needed for pain ? ?Prognosis:  ?Unable to determine ? ?Discharge Planning: To Be Determined  ? ?  ? ?Primary Diagnoses: ?Present on Admission: ? Acute renal failure superimposed on stage 4 chronic kidney disease ([place]) ? 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 Elevated LFTs ? Emphysema ([place]) ? Acute metabolic acidosis ? Anemia due to chronic kidney disease ? 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 Malnutrition of moderate degree ? Hyperlipidemia associated with type 2 diabetes mellitus ([place]) ? Essential hypertension ? CAD, multiple vessel ? Rectal pain ? ? ?I have reviewed the medical record, interviewed the patient and family, and examined the patient. The following aspects are pertinent. ? ?Past Medical History:  ?Diagnosis Date  ? Anemia 1964  ? Arthritis   ? "touch starting to show up in the joints" (12/11/2013)  ? CAD (coronary artery disease), native coronary artery   ? a. Cath 01/30/17 showing multivessel CAD --> underwent CABG on 02/15/2017 with LIMA-LAD, SVG-PDA, and SVG-Ramus-OM1.   ? Carotid artery calcification   ? a. 01/2017: s/p L CEA performed at time of CABG; Existing Mod-Severe R Carotid stenosis  ? Chronic kidney disease, stage IV (severe) ([place])   ? "my kidneys are down to 30% function"; nephrologist-Dr. Carolin Sicks  ? COPD (chronic obstructive pulmonary disease) ([place])   ? "they said I had it"  ? Diabetes ([place])   ? Dyspnea   ? with exertion  ? Former heavy tobacco smoker   ? GERD (gastroesophageal reflux disease)   ? Dr. Michail Sermon  ? History of alcohol  abuse   ? 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 LIMA-LAD, SVG-PDA, SVG-RI-OM 1  ? Hyperlipemia   ? ? ?Allergies  ?Allergen Reactions  ? Ceclor [Cefaclor] Anaphylaxis, Hives and Other (See Comments)  ?  BASED ON CRITERIA FOR ANAPHYLAXIS ?HIVES + HYPOTENSION  ? ? ?Review of Systems  ?Cardiovascular:  Positive for chest pain.  ?Gastrointestinal:  Positive for rectal pain.  ? ?Physical Exam ?Vitals reviewed.  ?Constitutional:   ?   General: He is in acute distress.  ?   Appearance: He is ill-appearing.  ?Pulmonary:  ?   Effort: Pulmonary effort is normal.  ?Neurological:  ?   Mental Status: He is alert and oriented to person, place, and time.  ? ? ?Vital Signs: BP (!) 105/48 (BP Location: Left Arm)   Pulse 82   Temp 98.1 ?F (36.7 ?C) (Oral)   Resp 17   Ht $R'6\' 1"'sP$  (1.854 m)   Wt 91.4 kg   SpO2 94%   BMI 26.59 kg/m?  ?Pain Scale: 0-10 ?  ?Pain Score: 6  ? ? ?SpO2: SpO2: 94 % ?O2 Device:SpO2: 94 % ?O2 Flow Rate: .  ? ? ?LBM: Last BM Date : 09/26/2021 ?  ? ?Palliative Assessment/Data: PPS  30-40% ? ? ? ?MDM - High ? ?Signed by: ?Lavena Bullion, NP ?  ?Please contact Palliative Medicine Team phone at [phone] for questions and concerns.  ?For individual provider: See Amion ? ? ? ?

## 2021-10-20 NOTE — Assessment & Plan Note (Addendum)
-   Continue Zebeta.  Statin discontinued.   ?-Patient transitioned to full comfort measures. ?

## 2021-10-20 NOTE — Assessment & Plan Note (Addendum)
-   Was on a statin which has been discontinued as patient being transitioned to full comfort measures. ?

## 2021-10-20 NOTE — Assessment & Plan Note (Addendum)
-   Likely secondary to CKD stage IV. ?-H&H stable. ?-Patient transitioned to comfort measures. ? ?

## 2021-10-20 NOTE — Assessment & Plan Note (Addendum)
-   Patient seen by palliative care and patient started on a Dilaudid drip per palliative care.   ?-Palliative care discussed with patient and family and decision made to transition to full comfort measures. ?

## 2021-10-20 NOTE — Progress Notes (Signed)
Report received from ER nurse for patient coming to [place] room 8. Per ER nurse, foley catheter has not been placed yet, but will be before patient comes up.  ?

## 2021-10-20 NOTE — Assessment & Plan Note (Addendum)
-   Patient with a transaminitis likely secondary to metastatic small cell lung cancer. ?-ED physician spoke with GI, Dr. Paulita Fujita for formal consultation why recommended CT abdomen and pelvis. ?-CT abdomen and pelvis done with an enlarged liver noted with multiple hypodensities consistent with diffuse metastatic disease, several small nodules noted at the left lung base concerning for metastatic disease, destructive soft tissue lesion in the right transverse process of L4 consistent with metastatic disease, mild prostatic enlargement, sigmoid diverticulosis without inflammation. ?-CT abdomen and pelvis reviewed by gastroenterology, Dr. Paulita Fujita who has communicated to me that he does not feel ERCP will be helpful at this time and nothing further to offer this unfortunate gentleman at this time. ?-Patient seen by palliative care and decision made to transition to full comfort measures. ?

## 2021-10-20 NOTE — H&P (Signed)
?History and Physical  ? ? ?Paul Mcneil IDP:[id number] DOB: 1946/03/27 DOA: 10/06/2021 ? ?PCP: [name], MD ?Patient coming from: Home ? ?I have personally briefly reviewed patient's old medical records in [place] ? ?Chief Complaint: Abnormal lab ? ?HPI: Paul Mcneil is a 76 y.o. 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  Patient and daughter at bedside stating patient went to the cancer center for lab work and was called back to present to the ED due to abnormal labs of worsening renal function.  Lab work from oncology center with a creatinine of 9.28. ?Patient denies any fevers, no chills, no vomiting, no diarrhea, no melena, no hematemesis, no hematochezia.  Patient denies any syncopal episodes.  Patient does endorse some right-sided chest pain, rectal pain which he attributes to hemorrhoids, right upper quadrant and epigastric abdominal pain, nausea, some shortness of breath, increased lower extremity edema, right-sided chest pain, wheezing. ?ED Course: Patient seen in the ED repeat comprehensive metabolic profile with a bicarb of 11, glucose of 60, BUN of 122, creatinine of 9.72, anion gap of 28, alk phosphatase of 612, albumin of 2.3, AST of 360, ALT of 21, protein of 5.4, bilirubin of 4.3.  BNP noted at 746.7 CBC done with a hemoglobin of 11.6 otherwise was within normal limits.  INR of 1.5.  Urinalysis cloudy, amber, large hemoglobin, 5 ketones, nitrite negative, leukocytes negative protein 100, specific gravity 1.014, few bacteria, 6-10 WBCs.  Chest x-ray done with increased interstitial and patchy airspace opacities in the right upper lung could represent worsening postobstructive pneumonitis and/or progressive malignancy.  Right suprahilar soft tissue  prominence compatible with known mass and adenopathy.  Renal ultrasound with increased cortical echogenicity, cortical thinning in bilateral kidneys as can be seen with medical renal disease.  No hydronephrosis.  ED physician called and spoke with nephrology for formal consultation, palliative care consultation also obtained and pending, GI consulted and pending who recommended CT abdomen and pelvis.  Hospitalist were called to admit the patient for further evaluation and management ? ?Review of Systems: As per HPI otherwise all other systems reviewed and are negative. ? ?Past Medical History:  ?Diagnosis Date  ? Anemia 1964  ? Arthritis   ? "touch starting to show up in the joints" (12/11/2013)  ? CAD (coronary artery disease), native coronary artery   ? a. Cath 01/30/17 showing multivessel CAD --> underwent CABG on 02/15/2017 with LIMA-LAD, SVG-PDA, and SVG-Ramus-OM1.   ? Carotid artery calcification   ? a. 01/2017: s/p L CEA performed at time of CABG; Existing Mod-Severe R Carotid stenosis  ? Chronic kidney disease, stage IV (severe) ([place])   ? "my kidneys are down to 30% function"; nephrologist-Dr. Carolin Sicks  ? COPD (chronic obstructive pulmonary disease) ([place])   ? "they said I had it"  ? Diabetes ([place])   ? Dyspnea   ? with exertion  ? Former heavy tobacco smoker   ? GERD (gastroesophageal reflux disease)   ? Dr. Michail Sermon  ? History of alcohol abuse   ? 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 LIMA-LAD, SVG-PDA, SVG-RI-OM 1  ? Hyperlipemia   ? ? ?Past Surgical History:  ?Procedure Laterality Date  ? APPENDECTOMY  12/11/2013  ? "laparoscopic"  ? BRONCHOSCOPY    ? CHOLECYSTECTOMY  ~ 2010  ?  CORONARY ARTERY BYPASS GRAFT N/A 02/15/2017  ? Procedure: CORONARY ARTERY BYPASS GRAFTING (CABG) x4 :LAD to  LIMA, SVG to PDA , Sequential SVG to OM1 and Ramus;  Surgeon: Melrose Nakayama, MD;  Location: [facility];  Service: Open Heart Surgery;  Laterality: N/A;  ? ENDARTERECTOMY Left 02/15/2017  ? Procedure: LEFT ENDARTERECTOMY CAROTID;   Surgeon: Angelia Mould, MD;  Location: [facility];  Service: Vascular;  Laterality: Left;  ? ENDARTERECTOMY Right 03/04/2018  ? Procedure: ENDARTERECTOMY CAROTID RIGHT WITH PRIMARY CLOSURE OF ARTERY;  Surgeon: Angelia Mould, MD;  Location: [facility];  Service: Vascular;  Laterality: Right;  ? LAPAROSCOPIC APPENDECTOMY N/A 12/11/2013  ? Procedure: APPENDECTOMY LAPAROSCOPIC;  Surgeon: Imogene Burn. Georgette Dover, MD;  Location: [facility];  Service: General;  Laterality: N/A;  ? RIGHT/LEFT HEART CATH AND CORONARY ANGIOGRAPHY N/A 01/30/2017  ? Procedure: Right/Left Heart Cath and Coronary Angiography;  Surgeon: Troy Sine, MD;  Location: [facility];  Service: Cardiovascular: Normal RHC pressures.  Significant multivessel CAD with 50-60% smooth d-LM, 40% mid LAD. 50% mid LAD stenoses; RI --70% ostial & 80% prox RI; 90% pCx w/  80% and 70% distal; 80% oxt-prox & 90% prox-mid -> 100% CTO distal mid RCA w/ L-R collaterals  ? TEE WITHOUT CARDIOVERSION N/A 02/15/2017  ? Procedure: TRANSESOPHAGEAL ECHOCARDIOGRAM (TEE);  Surgeon: Melrose Nakayama, MD;  Location: [facility];  Service: Open Heart Surgery: Vigorous wall motion.  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  Mild MR noted.  ? TONSILLECTOMY AND ADENOIDECTOMY  1950's  ? TRANSTHORACIC ECHOCARDIOGRAM  01/2017  ? 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  Otherwise normal cavity size.  Mild LVH.  EF 60-65%.  GR 1 DD.  Mild MR.  ? ? ?Social History ? reports that he quit smoking about 5 years ago. His smoking use included cigarettes. He has a 110.00 pack-year smoking history. He has never used smokeless tobacco. He reports that he does not drink alcohol and does not use drugs. ? ?Allergies  ?Allergen Reactions  ? Ceclor [Cefaclor] Anaphylaxis, Hives and Other (See Comments)  ?  BASED ON CRITERIA FOR ANAPHYLAXIS ?HIVES + HYPOTENSION  ? ? ?Family History  ?Problem Relation Age of Onset  ? Rheum arthritis  Mother   ? Cancer Father   ?     BONE MARROW  ? ?Mother deceased unsure of what mother passed away from.  Father deceased age 75 from bone cancer ? ?Prior to Admission medications   ?Medication Sig Start Date End Date Taking? Authorizing Provider  ?acetaminophen (TYLENOL) 500 MG tablet Take 1,000 mg by mouth every 6 (six) hours as needed for moderate pain.   Yes [provider]  ?aspirin EC 81 MG tablet Take 1 tablet (81 mg total) by mouth daily. Swallow whole. Start 3/9. 09/25/21  Yes Samuella Cota, MD  ?bisoprolol (ZEBETA) 10 MG tablet Take 1 tablet (10 mg total) by mouth daily. 02/07/21  Yes Leonie Man, MD  ?cholecalciferol (VITAMIN D) 25 MCG (1000 UNIT) tablet Take 4,000 Units by mouth daily.   Yes [provider]  ?esomeprazole (NEXIUM) 40 MG capsule Take 1 capsule (40 mg total) by mouth daily as needed (for acid reflux). 03/11/17  Yes Strader, Fransisco Hertz, PA-C  ?Lactobacillus Casei-Folic Acid 62-9.47 MG CAPS Take 1 tablet by mouth daily as needed (for digestive health).    Yes [provider]  ?LEVEMIR FLEXTOUCH 100 UNIT/ML Pen Inject 10 Units into  the skin 2 (two) times daily. 08/28/17  Yes [provider]  ?Multiple Vitamin (MULTIVITAMIN WITH MINERALS) TABS tablet Take 1 tablet by mouth every other day. Advanced Senior Formula   Yes [provider]  ?omega-3 acid ethyl esters (LOVAZA) 1 g capsule Take 1 capsule (1 g total) by mouth 2 (two) times daily. ?Patient taking differently: Take 2 g by mouth daily. 10/19/20  Yes Almyra Deforest, PA  ?ondansetron (ZOFRAN) 4 MG tablet Take 1 tablet (4 mg total) by mouth every 8 (eight) hours as needed for nausea or vomiting. 09/25/21 09/25/22 Yes Samuella Cota, MD  ?oxyCODONE-acetaminophen (PERCOCET/ROXICET) 5-325 MG tablet Take 1 tablet by mouth every 8 (eight) hours as needed for severe pain. 10/18/21  Yes Curt Bears, MD  ?prochlorperazine (COMPAZINE) 10 MG tablet Take 1 tablet (10 mg total) by mouth every 6 (six) hours  as needed for nausea or vomiting. 10/17/21  Yes Curt Bears, MD  ?rosuvastatin (CRESTOR) 40 MG tablet Take 1 tablet (40 mg total) by mouth daily. 08/24/21 11/22/21 Yes Leonie Man, MD  ?Ensure Max Protein

## 2021-10-20 NOTE — Assessment & Plan Note (Addendum)
-   Likely secondary to acute renal failure. ?-Patient with no signs and symptoms of infection. ?-Patient with low blood glucose levels secondary to poor oral intake. ?-Patient seen by nephrology who recommended comfort measures. ?-See above. ?-Patient initially started on bicarb drip which was subsequently discontinued. ?-Patient seen by palliative care and decision made to transition to full comfort measures. ?-Patient was kept comfortable, placed on a Dilaudid drip. ?Patient subsequently died at 0443 hours on 11-05-2021 ?

## 2021-10-20 NOTE — Telephone Encounter (Signed)
Pt came to Education session today with daughter Adrianne. Pt moaning and groaning with pain from rectal spasms. Pain 10/10. ?He was given Percocet 1 tablet every 8 hrs prn severe pain on 10-18-21. Took a dose at 0800. ?Pt sleepy as well from medication. ?Spoke with Yetta Glassman CMA.  Dr Julien Nordmann  and PA-C out of the office. Ansyi stated that with pain 10/10 Dr. Julien Nordmann recommends that the pt go to the ED to be evaluated. ?Pt declined to go to ED. Rescheduled class with daughter on Monday at 0900 while dad in infusion room. Pt went to lab and then home. ?Reviewed above with Alda Lea, NP with palliative care. Told daughter that Lexine Baton recommended using the Percocet 1 tab every 6 hours as needed for pain.  He also needs to take Miralax 1 capful once a day and if no BM then take in evening.  Daughter states that her father has been giving himself enemas. ?Told her that Lexine Baton will discuss her father's case with Dr. Julien Nordmann on 10-23-21 and set up an appointment to see her to help with pain management. Daughter verbalized understanding. ?

## 2021-10-20 NOTE — Assessment & Plan Note (Addendum)
-   With metastatic disease. ?-Patient was to start palliative chemotherapy on 10/21/2021 per oncology. ?-Oncology informed of patient's admission via epic. ?-Patient with worsening renal function, poor prognosis, liver mets. ?-Palliative care consulted and decision made to transition to full comfort measures. ?-Patient was kept comfortable and subsequently died at 0443 hrs. on 2021/10/25 ?-May his soul rest in peace. ?

## 2021-10-20 NOTE — Assessment & Plan Note (Addendum)
-   Continue Zebeta.   ?-Patient transitioned to full comfort measures. ?

## 2021-10-20 NOTE — Assessment & Plan Note (Addendum)
-   Patient was placed on Dulera, Spiriva.   ?-DuoNebs as needed. ?-Patient transitioned to full comfort measures. ?

## 2021-10-20 NOTE — Telephone Encounter (Signed)
CRITICAL VALUE STICKER ? ?CRITICAL VALUE: Creatinine = 9.28, Total Bili = 4.1, AST = 322 ? ?RECEIVER (on-site recipient of call): Yetta Glassman, CMA ? ?DATE & TIME NOTIFIED: 10/01/2021 at 10:24am ? ?MESSENGER (representative from lab): Hillary ? ?MD NOTIFIED: Julien Nordmann ? ?TIME OF NOTIFICATION: 10/17/2021 at 10:30am ? ?RESPONSE: Per Dr. Julien Nordmann, pt needs to go to the ED. ? ?Of note, Louise, RN, in Patient Education was advised to direct pt to go to the ER this morning given pt c/o 10/10 pain level. Per Barbaraann Share, she "mentioned" going to the ER to the pt and he declined. ? ?I have called pts daughter, Juventino Slovak, and advised that pt needs to go to the ER asap. She states she is at the store buying pt adult diapers but would try to convince the pt to go to the ER. ? ?I have also called the pt and advised of this. He is very reluctant to go to the ER with a wait time. He states he is unable to control his bowels and doesn't want to have an accident while waiting. Pt asked if I could let the ER know he is coming. I advised the pt I would and if they can call me when they are in route, I'm happy to try to get him a room in the ER. Pts daughter is also aware. ? ?

## 2021-10-20 NOTE — Assessment & Plan Note (Addendum)
-   Patient with complaints of rectal pain feels secondary to hemorrhoids. ?-Patient placed on Anusol rectal cream 4 times daily.   ?-Sitz bath's.   ?-Pain management.   ?-Patient transitioned to full comfort measures. ?

## 2021-10-20 NOTE — Assessment & Plan Note (Addendum)
-   BP soft. ?-Patient subsequently transitioned to full comfort measures. ?

## 2021-10-20 NOTE — ED Triage Notes (Signed)
Patient sent to ED from cancer center for evaluation of worsening kidney failure, reported to be creatinine 9.7. Also complains of lower extremity edema and rectal pain. Patient recently diagnosed with lung cancer, plan to start chemotherapy next week. ?

## 2021-10-20 NOTE — Assessment & Plan Note (Addendum)
-   Patient presenting with acute renal failure on chronic kidney disease stage IV. ?-Creatinine currently at 10.81 from 9.72(09/30/2021) from 6.1(10/16/2021) from 3.86 on 09/25/2021.  Patient's baseline creatinine noted approximately 2.5 03/05/2018 and has been steadily increasing since 09/21/2021. ?-Likely secondary to prerenal azotemia in the setting of poor oral intake versus progressive chronic kidney disease. ?-Patient with metastatic small cell lung cancer with mets to the liver and bone and patient with poor prognosis was to start palliative chemotherapy 10/21/2021. ?-Renal ultrasound done negative for hydronephrosis and consistent with medical renal disease. ?-UA with some proteinuria. ?-Urine sodium of 18, urine creatinine of 70.24. ?-Foley catheter placed with minimal urine output. ?-Patient noted with some lower extremity edema and was placed on bicarb drip. ?-Patient seen in consultation by nephrology, who feel patient's kidneys were shutting down in the setting of organ failure secondary to metastatic cancer. ?-Nephrology does not think dialysis will be useful here and will just prolong suffering. ?-Per nephrology patient with a very poor prognosis whether he undergoes dialysis or not and is not going to improve his quality of life and have recommended comfort care and hospice with pain control. ?-Patient seen by palliative care and decision made to transition to comfort measures. ?-Patient started on Dilaudid drip. ?-Diet was liberalized. ?-For 3 hours on 18-Nov-2021 ?-May his soul rest in peace.  ?-Discontinue bicarb drip. ?-Diet has been liberalized. ?-Palliative care following. ?

## 2021-10-20 NOTE — ED Provider Notes (Signed)
?[facility] ?Provider Note ? ? ?CSN: [id number] ?Arrival date & time: 10/03/2021  1217 ? ?  ? ?History ?Chief Complaint  ?Patient presents with  ? Abnormal Lab  ? ? ?Paul Mcneil is a 76 y.o. male with h/o for small cell lung cancer with mets to the spine and liver and presents to the emergency department for an elevated creatinine.  Patient was recently seen at the cancer center and had a creatinine level around 9.0 prior patient is a member of bedside.  The patient was seen around Tuesday, 3 days ago, and had an elevated creatinine around 6 and was given fluids for this to improve.  The daughter reports that this fluid went down to his legs and now he has swelling.  He was taken off of the Lasix when his creatinine was around 6.  She reports that he was going to be given palliative chemo and radiation that was supposed to start next week at some time.  Currently the patient reports some mild shortness of breath, but is really concerned about the rectal pain that he has been having intermittently for the past week.  Denies any hematochezia, melena, or any rectal bleeding.  He denies any dysuria or hematuria.  Denies any fever of vomiting.  He reports he had some periumbilical abdominal pain for over 2 weeks.  Mentions some occasional nausea with decrease in appetite.  Dad reports he had dark urine.  Medical history includes diabetes, CABG x4, COPD, hyperlipidemia, and hypertension. ? ? ? ? ?Abnormal Lab ? ?  ? ?Home Medications ?Prior to Admission medications   ?Medication Sig Start Date End Date Taking? Authorizing Provider  ?acetaminophen (TYLENOL) 500 MG tablet Take 1,000 mg by mouth every 6 (six) hours as needed for moderate pain.   Yes [provider]  ?aspirin EC 81 MG tablet Take 1 tablet (81 mg total) by mouth daily. Swallow whole. Start 3/9. 09/25/21  Yes Samuella Cota, MD  ?bisoprolol (ZEBETA) 10 MG tablet Take 1 tablet (10 mg total) by mouth daily.  02/07/21  Yes Leonie Man, MD  ?cholecalciferol (VITAMIN D) 25 MCG (1000 UNIT) tablet Take 4,000 Units by mouth daily.   Yes [provider]  ?esomeprazole (NEXIUM) 40 MG capsule Take 1 capsule (40 mg total) by mouth daily as needed (for acid reflux). 03/11/17  Yes Strader, Fransisco Hertz, PA-C  ?Lactobacillus Casei-Folic Acid 27-7.82 MG CAPS Take 1 tablet by mouth daily as needed (for digestive health).    Yes [provider]  ?LEVEMIR FLEXTOUCH 100 UNIT/ML Pen Inject 10 Units into the skin 2 (two) times daily. 08/28/17  Yes [provider]  ?Multiple Vitamin (MULTIVITAMIN WITH MINERALS) TABS tablet Take 1 tablet by mouth every other day. Advanced Senior Formula   Yes [provider]  ?omega-3 acid ethyl esters (LOVAZA) 1 g capsule Take 1 capsule (1 g total) by mouth 2 (two) times daily. ?Patient taking differently: Take 2 g by mouth daily. 10/19/20  Yes Almyra Deforest, PA  ?ondansetron (ZOFRAN) 4 MG tablet Take 1 tablet (4 mg total) by mouth every 8 (eight) hours as needed for nausea or vomiting. 09/25/21 09/25/22 Yes Samuella Cota, MD  ?oxyCODONE-acetaminophen (PERCOCET/ROXICET) 5-325 MG tablet Take 1 tablet by mouth every 8 (eight) hours as needed for severe pain. 10/18/21  Yes Curt Bears, MD  ?prochlorperazine (COMPAZINE) 10 MG tablet Take 1 tablet (10 mg total) by mouth every 6 (six) hours as needed for nausea or  vomiting. 10/17/21  Yes Curt Bears, MD  ?rosuvastatin (CRESTOR) 40 MG tablet Take 1 tablet (40 mg total) by mouth daily. 08/24/21 11/22/21 Yes Leonie Man, MD  ?Ensure Max Protein (ENSURE MAX PROTEIN) LIQD Take 330 mLs (11 oz total) by mouth daily. ?Patient not taking: Reported on 10/05/2021 09/25/21   Samuella Cota, MD  ?lidocaine-prilocaine (EMLA) cream Apply to the Port-A-Cath site 30 minutes before treatment ?Patient not taking: Reported on 10/08/2021 10/17/21   Curt Bears, MD  ?OZEMPIC, 0.25 OR 0.5 MG/DOSE, 2 MG/1.5ML SOPN Inject 0.25 mg into the skin  once a week. Mondays ?Patient not taking: Reported on 10/19/2021 07/10/21   [provider]  ?   ? ?Allergies    ?Ceclor [cefaclor]   ? ?Review of Systems   ?Review of Systems  ?Constitutional:  Positive for appetite change. Negative for fever.  ?Respiratory:  Negative for shortness of breath.   ?Cardiovascular:  Negative for chest pain.  ?Gastrointestinal:  Positive for abdominal pain, nausea and rectal pain. Negative for anal bleeding, constipation, diarrhea and vomiting.  ?Genitourinary:  Negative for decreased urine volume, dysuria, enuresis and hematuria.  ?Musculoskeletal:  Negative for back pain.  ? ?Physical Exam ?Updated Vital Signs ?BP (!) 116/54   Pulse 82   Temp 97.9 ?F (36.6 ?C) (Oral)   Resp 20   SpO2 95%  ?Physical Exam ?Vitals and nursing note reviewed. Exam conducted with a chaperone present Kathlee Nations, RN).  ?Constitutional:   ?   Appearance: He is ill-appearing.  ?   Comments: jaundiced  ?HENT:  ?   Mouth/Throat:  ?   Mouth: Mucous membranes are moist.  ?   Comments: Jaundiced under the tongue ?Eyes:  ?   General: Scleral icterus present.  ?Cardiovascular:  ?   Rate and Rhythm: Normal rate.  ?Pulmonary:  ?   Effort: Pulmonary effort is normal. No respiratory distress.  ?   Comments: Expiratory wheezing and diminshed breath sounds. No tachypnea. No accessory muscle use. Satting well on room air. Speaking in full sentences.  ?Abdominal:  ?   General: Bowel sounds are normal.  ?   Palpations: There is no mass.  ?   Tenderness: There is abdominal tenderness. There is no guarding or rebound.  ?   Comments: Small bruise noted to the left of the umbilicus which the patient reports is where he gets his "diabetes shot". Hepatomegaly. Diffuse tenderness without guarding or rebound.   ?Genitourinary: ?   Comments: Thickened skin on bilateral buttocks.  There are multiple external hemorrhoids seen.  No thrombosed hemorrhoids visualized.  Not bleeding.  Ranging colored stool.  No melena  appreciated. ?Musculoskeletal:  ?   Right lower leg: Edema present.  ?   Left lower leg: Edema present.  ?   Comments: 2+ pitting edema bilaterally  ?Skin: ?   Coloration: Skin is jaundiced.  ?Neurological:  ?   General: No focal deficit present.  ?   Mental Status: He is alert. Mental status is at baseline.  ? ? ?ED Results / Procedures / Treatments   ?Labs ?(all labs ordered are listed, but only abnormal results are displayed) ?Labs Reviewed  ?BRAIN NATRIURETIC PEPTIDE - Abnormal; Notable for the following components:  ?    Result Value  ? B Natriuretic Peptide 746.7 (*)   ? All other components within normal limits  ?COMPREHENSIVE METABOLIC PANEL - Abnormal; Notable for the following components:  ? CO2 11 (*)   ? Glucose, Bld 60 (*)   ?  BUN 122 (*)   ? Creatinine, Ser 9.72 (*)   ? Calcium 8.7 (*)   ? Total Protein 5.7 (*)   ? Albumin 2.3 (*)   ? AST 360 (*)   ? ALT 121 (*)   ? Alkaline Phosphatase 612 (*)   ? Total Bilirubin 4.3 (*)   ? GFR, Estimated 5 (*)   ? Anion gap 28 (*)   ? All other components within normal limits  ?CBC WITH DIFFERENTIAL/PLATELET - Abnormal; Notable for the following components:  ? RBC 3.97 (*)   ? Hemoglobin 11.6 (*)   ? HCT 36.3 (*)   ? RDW 19.9 (*)   ? Platelets 133 (*)   ? Lymphs Abs 0.5 (*)   ? Abs Immature Granulocytes 0.13 (*)   ? All other components within normal limits  ?PROTIME-INR - Abnormal; Notable for the following components:  ? Prothrombin Time 17.9 (*)   ? INR 1.5 (*)   ? All other components within normal limits  ?URINALYSIS, ROUTINE W REFLEX MICROSCOPIC  ? ? ?EKG ?None ? ?Radiology ?CT ABDOMEN PELVIS WO CONTRAST ? ?Result Date: 10/14/2021 ?CLINICAL DATA:  Acute generalized abdominal pain. History of metastatic lung cancer. EXAM: CT ABDOMEN AND PELVIS WITHOUT CONTRAST TECHNIQUE: Multidetector CT imaging of the abdomen and pelvis was performed following the standard protocol without IV contrast. RADIATION DOSE REDUCTION: This exam was performed according to the  departmental dose-optimization program which includes automated exposure control, adjustment of the mA and/or kV according to patient size and/or use of iterative reconstruction technique. COMPARISON:  September 21, 2021. FINDINGS: Lower

## 2021-10-20 NOTE — Consult Note (Signed)
[facility] ?Renal Consultation Note ? ?Requesting MD: Grandville Silos ?Indication for Consultation: A on CRF ? ?HPI:  ?Paul Mcneil is a 76 y.o. male with DM, COPD, hyperlipidemia with vascular dz CAD and cerebrovascular dz. He also has CKD-  crt in the high 1's in 2018-  has progressed to the mid 2's in 2019- followed by Dr. Carolin Sicks as OP-  crt in December of 22 was 2.55.  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  Labs have since shown progressive renal dysfunction-  crt 6.1 on 3/27 and over 9 today.  He was getting fluids as an OP to no avail-  has swelling now.  Daughter says he has felt constipated-  bothered with rectal pain-  just go opiods to help-  has not been eating.  Ultrasound did not show obstruction ? ?Creatinine  ?Date/Time Value Ref Range Status  ?10/04/2021 09:43 AM 9.28 (HH) 0.61 - 1.24 mg/dL Final  ?  Comment:  ?  CRITICAL RESULT CALLED TO, READ BACK BY AND VERIFIED WITH: ANSYI SILAS AT 1023 BY HFLYNT 10/19/2021   ?10/16/2021 01:32 PM 6.10 (HH) 0.61 - 1.24 mg/dL Final  ?  Comment:  ?  CRITICAL RESULT CALLED TO, READ BACK BY AND VERIFIED WITH: Abelina Bachelor at 1424 on 27Mar23 by [facility]   ? ?Creatinine, Ser  ?Date/Time Value Ref Range Status  ?09/25/2021 12:20 AM 3.86 (H) 0.61 - 1.24 mg/dL Final  ?09/24/2021 03:57 AM 4.11 (H) 0.61 - 1.24 mg/dL Final  ?09/23/2021 05:57 AM 4.53 (H) 0.61 - 1.24 mg/dL Final  ?09/22/2021 03:55 AM 4.80 (H) 0.61 - 1.24 mg/dL Final  ?09/21/2021 03:16 PM 5.30 (H) 0.61 - 1.24 mg/dL Final  ?03/05/2018 02:20 AM 2.42 (H) 0.61 - 1.24 mg/dL Final  ?02/24/2018 02:15 PM 2.40 (H) 0.61 - 1.24 mg/dL Final  ?02/19/2017 02:20 AM 2.32 (H) 0.61 - 1.24 mg/dL Final   ?02/18/2017 02:21 AM 2.34 (H) 0.61 - 1.24 mg/dL Final  ?02/17/2017 04:19 AM 2.39 (H) 0.61 - 1.24 mg/dL Final  ?02/16/2017 05:05 PM 2.20 (H) 0.61 - 1.24 mg/dL Final  ?02/16/2017 05:04 PM 2.25 (H) 0.61 - 1.24 mg/dL Final  ?02/16/2017 05:17 AM 2.00 (H) 0.61 - 1.24 mg/dL Final  ?02/15/2017 08:30 PM 1.90 (H) 0.61 - 1.24 mg/dL Final  ?02/15/2017 08:25 PM 1.96 (H) 0.61 - 1.24 mg/dL Final  ?02/15/2017 01:28 PM 1.80 (H) 0.61 - 1.24 mg/dL Final  ?02/15/2017 12:27 PM 1.60 (H) 0.61 - 1.24 mg/dL Final  ?02/15/2017 11:30 AM 1.40 (H) 0.61 - 1.24 mg/dL Final  ?02/15/2017 11:00 AM 1.70 (H) 0.61 - 1.24 mg/dL Final  ?02/15/2017 09:44 AM 1.80 (H) 0.61 - 1.24 mg/dL Final  ?02/15/2017 07:57 AM 1.80 (H) 0.61 - 1.24 mg/dL Final  ?02/13/2017 01:20 PM 1.91 (H) 0.61 - 1.24 mg/dL Final  ?02/01/2017 03:48 AM 2.09 (H) 0.61 - 1.24 mg/dL Final  ?01/24/2017 03:35 PM 1.91 (H) 0.76 - 1.27 mg/dL Final  ?01/10/2017 04:13 PM 1.80 (H) 0.40 - 1.50 mg/dL Final  ?12/11/2013 09:57 AM 1.36 (H) 0.50 - 1.35 mg/dL Final  ? ? ? ?PMHx: ?  ?Past Medical History:  ?Diagnosis Date  ?  Anemia 1964  ? Arthritis   ? "touch starting to show up in the joints" (12/11/2013)  ? CAD (coronary artery disease), native coronary artery   ? a. Cath 01/30/17 showing multivessel CAD --> underwent CABG on 02/15/2017 with LIMA-LAD, SVG-PDA, and SVG-Ramus-OM1.   ? Carotid artery calcification   ? a. 01/2017: s/p L CEA performed at time of CABG; Existing Mod-Severe R Carotid stenosis  ? Chronic kidney disease, stage IV (severe) ([place])   ? "my kidneys are down to 30% function"; nephrologist-Dr. Carolin Sicks  ? COPD (chronic obstructive pulmonary disease) ([place])   ? "they said I had it"  ? Diabetes ([place])   ? Dyspnea   ? with exertion  ? Former heavy tobacco smoker   ? GERD (gastroesophageal reflux disease)   ? Dr. Michail Sermon  ? History of alcohol abuse   ? 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 LIMA-LAD, SVG-PDA, SVG-RI-OM 1  ? Hyperlipemia   ? ? ?Past Surgical History:  ?Procedure Laterality Date  ?  APPENDECTOMY  12/11/2013  ? "laparoscopic"  ? BRONCHOSCOPY    ? CHOLECYSTECTOMY  ~ 2010  ? CORONARY ARTERY BYPASS GRAFT N/A 02/15/2017  ? Procedure: CORONARY ARTERY BYPASS GRAFTING (CABG) x4 :LAD to  LIMA, SVG to PDA , Sequential SVG to OM1 and Ramus;  Surgeon: Melrose Nakayama, MD;  Location: [facility];  Service: Open Heart Surgery;  Laterality: N/A;  ? ENDARTERECTOMY Left 02/15/2017  ? Procedure: LEFT ENDARTERECTOMY CAROTID;  Surgeon: Angelia Mould, MD;  Location: [facility];  Service: Vascular;  Laterality: Left;  ? ENDARTERECTOMY Right 03/04/2018  ? Procedure: ENDARTERECTOMY CAROTID RIGHT WITH PRIMARY CLOSURE OF ARTERY;  Surgeon: Angelia Mould, MD;  Location: [facility];  Service: Vascular;  Laterality: Right;  ? LAPAROSCOPIC APPENDECTOMY N/A 12/11/2013  ? Procedure: APPENDECTOMY LAPAROSCOPIC;  Surgeon: Imogene Burn. Georgette Dover, MD;  Location: [facility];  Service: General;  Laterality: N/A;  ? RIGHT/LEFT HEART CATH AND CORONARY ANGIOGRAPHY N/A 01/30/2017  ? Procedure: Right/Left Heart Cath and Coronary Angiography;  Surgeon: Troy Sine, MD;  Location: [facility];  Service: Cardiovascular: Normal RHC pressures.  Significant multivessel CAD with 50-60% smooth d-LM, 40% mid LAD. 50% mid LAD stenoses; RI --70% ostial & 80% prox RI; 90% pCx w/  80% and 70% distal; 80% oxt-prox & 90% prox-mid -> 100% CTO distal mid RCA w/ L-R collaterals  ? TEE WITHOUT CARDIOVERSION N/A 02/15/2017  ? Procedure: TRANSESOPHAGEAL ECHOCARDIOGRAM (TEE);  Surgeon: Melrose Nakayama, MD;  Location: [facility];  Service: Open Heart Surgery: Vigorous wall motion.  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  Mild MR noted.  ? TONSILLECTOMY AND ADENOIDECTOMY  1950's  ? TRANSTHORACIC ECHOCARDIOGRAM  01/2017  ? 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  Otherwise normal cavity size.  Mild LVH.  EF 60-65%.  GR 1 DD.  Mild MR.  ? ? ?Family Hx:  ?Family History  ?Problem Relation  Age of Onset  ? Rheum arthritis Mother   ? Cancer Father   ?     BONE MARROW  ? ? ?Social History:  reports that he quit smoking about 5 years ago. His smoking use included cigarettes. He has a 110.00 pack-year smoking history. He has never used smokeless tobacco. He reports that he does not drink alcohol and does not use drugs. ? ?Allergies:  ?Allergies  ?Allergen Reactions  ? Ceclor [Cefaclor] Anaphylaxis, Hives and Other (See Comments)  ?  BASED ON  CRITERIA FOR ANAPHYLAXIS ?HIVES + HYPOTENSION  ? ? ?Medications: ?Prior to Admission medications   ?Medication Sig Start Date End Date Taking? Authorizing Provider  ?acetaminophen (TYLENOL) 500 MG tablet Take 1,000 mg by mouth every 6 (six) hours as needed for moderate pain.    [provider]  ?aspirin EC 81 MG tablet Take 1 tablet (81 mg total) by mouth daily. Swallow whole. Start 3/9. 09/25/21   Samuella Cota, MD  ?bisoprolol (ZEBETA) 10 MG tablet Take 1 tablet (10 mg total) by mouth daily. 02/07/21   Leonie Man, MD  ?cholecalciferol (VITAMIN D) 25 MCG (1000 UNIT) tablet Take 4,000 Units by mouth daily.    [provider]  ?Ensure Max Protein (ENSURE MAX PROTEIN) LIQD Take 330 mLs (11 oz total) by mouth daily. 09/25/21   Samuella Cota, MD  ?esomeprazole (NEXIUM) 40 MG capsule Take 1 capsule (40 mg total) by mouth daily as needed (for acid reflux). 03/11/17   Erma Heritage, PA-C  ?Lactobacillus Casei-Folic Acid 34-9.17 MG CAPS Take 1 tablet by mouth daily as needed (for digestive health).     [provider]  ?LEVEMIR FLEXTOUCH 100 UNIT/ML Pen Inject 10 Units into the skin 2 (two) times daily. 08/28/17   [provider]  ?lidocaine-prilocaine (EMLA) cream Apply to the Port-A-Cath site 30 minutes before treatment 10/17/21   Curt Bears, MD  ?Multiple Vitamin (MULTIVITAMIN WITH MINERALS) TABS tablet Take 1 tablet by mouth every other day. [employer]    [provider]  ?omega-3 acid ethyl  esters (LOVAZA) 1 g capsule Take 1 capsule (1 g total) by mouth 2 (two) times daily. ?Patient taking differently: Take 2 g by mouth daily. 10/19/20   Almyra Deforest, PA  ?ondansetron (ZOFRAN) 4 MG tablet Take 1 tablet (4 mg tota

## 2021-10-21 ENCOUNTER — Ambulatory Visit (HOSPITAL_COMMUNITY): Payer: Medicare Other

## 2021-10-21 DIAGNOSIS — E872 Acidosis, unspecified: Secondary | ICD-10-CM | POA: Diagnosis not present

## 2021-10-21 DIAGNOSIS — N179 Acute kidney failure, unspecified: Secondary | ICD-10-CM | POA: Diagnosis not present

## 2021-10-21 DIAGNOSIS — I129 Hypertensive chronic kidney disease with stage 1 through stage 4 chronic kidney disease, or unspecified chronic kidney disease: Secondary | ICD-10-CM | POA: Diagnosis not present

## 2021-10-21 DIAGNOSIS — R7989 Other specified abnormal findings of blood chemistry: Secondary | ICD-10-CM | POA: Diagnosis not present

## 2021-10-21 DIAGNOSIS — K6289 Other specified diseases of anus and rectum: Secondary | ICD-10-CM | POA: Diagnosis not present

## 2021-10-21 DIAGNOSIS — N184 Chronic kidney disease, stage 4 (severe): Secondary | ICD-10-CM | POA: Diagnosis not present

## 2021-10-21 DIAGNOSIS — E1169 Type 2 diabetes mellitus with other specified complication: Secondary | ICD-10-CM | POA: Diagnosis not present

## 2021-10-21 DIAGNOSIS — Z515 Encounter for palliative care: Secondary | ICD-10-CM | POA: Diagnosis not present

## 2021-10-21 DIAGNOSIS — C7951 Secondary malignant neoplasm of bone: Secondary | ICD-10-CM | POA: Diagnosis not present

## 2021-10-21 DIAGNOSIS — I10 Essential (primary) hypertension: Secondary | ICD-10-CM | POA: Diagnosis not present

## 2021-10-21 DIAGNOSIS — D649 Anemia, unspecified: Secondary | ICD-10-CM | POA: Diagnosis not present

## 2021-10-21 DIAGNOSIS — I2510 Atherosclerotic heart disease of native coronary artery without angina pectoris: Secondary | ICD-10-CM | POA: Diagnosis not present

## 2021-10-21 DIAGNOSIS — G893 Neoplasm related pain (acute) (chronic): Secondary | ICD-10-CM | POA: Diagnosis not present

## 2021-10-21 DIAGNOSIS — R17 Unspecified jaundice: Secondary | ICD-10-CM

## 2021-10-21 DIAGNOSIS — E440 Moderate protein-calorie malnutrition: Secondary | ICD-10-CM | POA: Diagnosis not present

## 2021-10-21 DIAGNOSIS — Z66 Do not resuscitate: Secondary | ICD-10-CM | POA: Diagnosis not present

## 2021-10-21 DIAGNOSIS — J439 Emphysema, unspecified: Secondary | ICD-10-CM | POA: Diagnosis not present

## 2021-10-21 DIAGNOSIS — E8721 Acute metabolic acidosis: Secondary | ICD-10-CM | POA: Diagnosis not present

## 2021-10-21 DIAGNOSIS — C3411 Malignant neoplasm of upper lobe, right bronchus or lung: Secondary | ICD-10-CM | POA: Diagnosis not present

## 2021-10-21 LAB — COMPREHENSIVE METABOLIC PANEL
ALT: 112 U/L — ABNORMAL HIGH (ref 0–44)
AST: 317 U/L — ABNORMAL HIGH (ref 15–41)
Albumin: 2 g/dL — ABNORMAL LOW (ref 3.5–5.0)
Alkaline Phosphatase: 547 U/L — ABNORMAL HIGH (ref 38–126)
Anion gap: 29 — ABNORMAL HIGH (ref 5–15)
BUN: 132 mg/dL — ABNORMAL HIGH (ref 8–23)
CO2: 10 mmol/L — ABNORMAL LOW (ref 22–32)
Calcium: 7.9 mg/dL — ABNORMAL LOW (ref 8.9–10.3)
Chloride: 98 mmol/L (ref 98–111)
Creatinine, Ser: 10.81 mg/dL — ABNORMAL HIGH (ref 0.61–1.24)
GFR, Estimated: 5 mL/min — ABNORMAL LOW (ref >60)
Glucose, Bld: 60 mg/dL — ABNORMAL LOW (ref 70–99)
Potassium: 4.3 mmol/L (ref 3.5–5.1)
Sodium: 137 mmol/L (ref 135–145)
Total Bilirubin: 3.9 mg/dL — ABNORMAL HIGH (ref 0.3–1.2)
Total Protein: 4.9 g/dL — ABNORMAL LOW (ref 6.5–8.1)

## 2021-10-21 LAB — CBC
HCT: 31.1 % — ABNORMAL LOW (ref 39.0–52.0)
Hemoglobin: 10.3 g/dL — ABNORMAL LOW (ref 13.0–17.0)
MCH: 30 pg (ref 26.0–34.0)
MCHC: 33.1 g/dL (ref 30.0–36.0)
MCV: 90.7 fL (ref 80.0–100.0)
Platelets: 131 10*3/uL — ABNORMAL LOW (ref 150–400)
RBC: 3.43 MIL/uL — ABNORMAL LOW (ref 4.22–5.81)
RDW: 20.5 % — ABNORMAL HIGH (ref 11.5–15.5)
WBC: 9 10*3/uL (ref 4.0–10.5)
nRBC: 0 % (ref 0.0–0.2)

## 2021-10-21 LAB — URINE CULTURE: Culture: NO GROWTH

## 2021-10-21 LAB — GLUCOSE, CAPILLARY
Glucose-Capillary: 74 mg/dL (ref 70–99)
Glucose-Capillary: 90 mg/dL (ref 70–99)
Glucose-Capillary: 77 mg/dL (ref 70–99)

## 2021-10-21 LAB — MAGNESIUM: Magnesium: 2.7 mg/dL — ABNORMAL HIGH (ref 1.7–2.4)

## 2021-10-21 MED ORDER — HYDROMORPHONE BOLUS VIA INFUSION
0.5000 mg | INTRAVENOUS | Status: DC | PRN
  Filled 2021-10-21: qty 1

## 2021-10-21 MED ORDER — LORAZEPAM 2 MG/ML PO CONC: 1.0000 mg | ORAL | Status: DC | PRN

## 2021-10-21 MED ORDER — BIOTENE DRY MOUTH MT LIQD: 15.0000 mL | OROMUCOSAL | Status: DC | PRN

## 2021-10-21 MED ORDER — ALBUMIN HUMAN 25 % IV SOLN
25.0000 g | Freq: Four times a day (QID) | INTRAVENOUS | Status: DC
  Administered 2021-10-21: 25 g via INTRAVENOUS
  Filled 2021-10-21: qty 100

## 2021-10-21 MED ORDER — GLYCOPYRROLATE 0.2 MG/ML IJ SOLN
0.2000 mg | INTRAMUSCULAR | Status: DC | PRN
  Administered 2021-10-21 – 2021-10-22 (×2): 0.2 mg via INTRAVENOUS
  Filled 2021-10-21 (×2): qty 1

## 2021-10-21 MED ORDER — LORAZEPAM 2 MG/ML IJ SOLN: 1.0000 mg | INTRAMUSCULAR | Status: DC | PRN

## 2021-10-21 MED ORDER — GERHARDT'S BUTT CREAM
TOPICAL_CREAM | Freq: Three times a day (TID) | CUTANEOUS | Status: DC | PRN
  Filled 2021-10-21: qty 1

## 2021-10-21 MED ORDER — LORAZEPAM 1 MG PO TABS: 1.0000 mg | ORAL_TABLET | ORAL | Status: DC | PRN

## 2021-10-21 MED ORDER — SODIUM CHLORIDE 0.9 % IV SOLN
2.0000 mg/h | INTRAVENOUS | Status: DC
  Administered 2021-10-21: 1 mg/h via INTRAVENOUS
  Filled 2021-10-21: qty 5

## 2021-10-21 MED ORDER — GLYCOPYRROLATE 0.2 MG/ML IJ SOLN: 0.2000 mg | INTRAMUSCULAR | Status: DC | PRN

## 2021-10-21 MED ORDER — POLYVINYL ALCOHOL 1.4 % OP SOLN
1.0000 [drp] | Freq: Four times a day (QID) | OPHTHALMIC | Status: DC | PRN
  Filled 2021-10-21: qty 15

## 2021-10-21 MED ORDER — GLYCOPYRROLATE 1 MG PO TABS
1.0000 mg | ORAL_TABLET | ORAL | Status: DC | PRN
  Filled 2021-10-21: qty 1

## 2021-10-21 NOTE — Progress Notes (Signed)
?                                                                                                                                                     ?                                                   ?Palliative Medicine Progress Note  ? ?Patient Name: GLENDA KUNST       Date: 10/21/2021 ?DOB: 1945-08-10  Age: 76 y.o. MRN#: [id number] ?Attending Physician: Eugenie Filler, MD ?Primary Care Physician: [name], MD ?Admit Date: 10/19/2021 ? ? ?HPI/Patient Profile: 76 y.o. male  with past medical history of chronic kidney disease stage IV, CAD status post CABG in 2018, COPD, and diabetes. 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  He is being followed by oncology and was getting ready to start palliative chemotherapy on 10/21/2021.  He presented to the emergency department on 09/24/2021 with abnormal labs from the cancer center indicating worsening renal function.  Repeat labs in the ED showed creatinine of 9.72.   ?He is admitted to the hospitalist service with multiple issues including acute renal failure superimposed on stage IV chronic kidney disease, acute metabolic acidosis, malnutrition, elevated LFTs, and rectal pain.  ?  ?Subjective: ?I met with patient and his daughter Adrianne at bedside.  Patient continues to be in significant pain.  Per review of MAR, he has only received 1 dose of  IV Dilaudid since it was started yesterday evening. ? ?Discussion was had regarding patient's worsening renal function.  Discussed that per nephrology, his kidneys are likely failing in the setting of organ failure related to metastatic cancer.  Per nephrology, dialysis will not likely improve his quality of life but may prolong his suffering.  Discussed that recommendation is for comfort care.  Patient and daughter both seem accepting of this.  Patient states "I just want this pain to go away ". ? ?The difference between full scope medical intervention and  comfort care was reviewed.  I reviewed the concept of a comfort path with patient and family, emphasizing that this path involves de-escalating and stopping full scope medical interventions, allowing a natural course to occur. Discussed that the goal is comfort and dignity rather than cure/prolonging life.  ? ?Patient states he would prefer to remain in the hospital for end-of-life; he is not interested in transferring to a residential hospice facility.  Discussed the details of transitioning to comfort care in the hospital, and what that would look like--keeping him clean and dry, no labs, no artificial hydration or feeding, no antibiotics, minimizing of medications, comfort feeds,  and medication for pain and dyspnea.  Discussed that I would start a continuous infusion of pain medication to ensure his comfort at end-of-life. ? ?15:30 - I returned to bedside for a symptom check. Dilaudid infusion is at 1 mg/hr. Patient reports his pain is improved from 9-10 down to 5-6. I administered a bolus dose of 1 mg dilaudid from the pump. Will increase infusion rate to 2 mg/hr.  ? ? ?Objective: ? ?Physical Exam ?Vitals reviewed.  ?Constitutional:   ?   General: He is not in acute distress. ?   Appearance: He is ill-appearing.  ?Pulmonary:  ?   Effort: Pulmonary effort is normal.  ?Neurological:  ?   Mental Status: He is alert and oriented to person, place, and time.  ?   Motor: Weakness present.  ?         ? ?Vital Signs: BP (!) 87/51 (BP Location: Left Arm)   Pulse 75   Temp 97.9 ?F (36.6 ?C) (Oral)   Resp 16   Ht $R'6\' 1"'gq$  (1.854 m)   Wt 89.7 kg   SpO2 92%   BMI 26.09 kg/m?  ?SpO2: SpO2: 92 % ?O2 Device: O2 Device: Room Air ?O2 Flow Rate:   ? ? ?LBM: Last BM Date : 09/29/2021 ? ?   ?Palliative Assessment/Data: PPS 20% ? ? ? ? ?Palliative Medicine Assessment & Plan  ? ?Assessment: ?Principal Problem: ?  Acute renal failure superimposed on stage 4 chronic kidney disease ([place]) ?Active Problems: ?  Emphysema ([place]) ?  CAD,  multiple vessel ?  Hyperlipidemia associated with type 2 diabetes mellitus ([place]) ?  S/P CABG x 4 ?  Essential hypertension ?  Elevated LFTs ?  Pain due to malignant neoplasm metastatic to bone [facility]) ?  Malnutrition of moderate degree ?  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  Acute metabolic acidosis ?  Anemia due to chronic kidney disease ?  Rectal pain ?  ? ?Recommendations/Plan: ?Full comfort measures initiated ?DNR/DNI as previously documented ?Added orders for symptom management at EOL as well as ?Discontinued orders that were not focused on comfort ?Patient and daughter declined transfer to residential hospice facility ?PMT will continue to follow  ? ?Symptom Management:  ?Dilaudid infusion for pain control - start at 1 mg/hour; may bolus with 0.5-1 mg every 15 minutes as needed for uncontrolled pain or dyspnea ?Lorazepam (ATIVAN) prn for anxiety ?Glycopyrrolate (ROBINUL) for excessive secretions ?Ondansetron (ZOFRAN) prn for nausea ?Polyvinyl alcohol (LIQUIFILM TEARS) prn for dry eyes ?Antiseptic oral rinse (BIOTENE) prn for dry mouth ? ?Prognosis: ? Days ? ?Discharge Planning: ?Anticipated Hospital Death ? ?Care plan was discussed with Dr. Grandville Silos, Dr. Moshe Cipro, and bedside RN (via secure chat) ? ? ?Thank you for allowing the Palliative Medicine Team to assist in the care of this patient. ? ?Total time: 67 minutes ? ? ?Lavena Bullion, NP ? ? ?Please contact Palliative Medicine Team phone at [phone] for questions and concerns.  ?For individual providers, please see AMION. ? ? ? ? ? ?

## 2021-10-21 NOTE — Progress Notes (Signed)
Chaplain responded to page from pt's nurse to offer spiritual care to pt and his daughter.  By the time the Chaplain arrived the daughter had left.  Chaplain offered ministry of presence as pt explained his daughter was having a hard time with the decision he had made to move to comfort care.  Pt explained he is ready to go to his Maker; he has prayed with Luanna Cole, he knows the [facility] is waiting for him to arrive.  Chaplain prayed with pt at bedside. ? ?De Burrs ?Chaplain ?

## 2021-10-21 NOTE — Hospital Course (Signed)
Paul Mcneil is a 76 y.o. 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  Patient and daughter at bedside stating patient went to the cancer center for lab work and was called back to present to the ED due to abnormal labs of worsening renal function.  Lab work from oncology center with a creatinine of 9.28. ?Patient denies any fevers, no chills, no vomiting, no diarrhea, no melena, no hematemesis, no hematochezia.  Patient denies any syncopal episodes.  Patient does endorse some right-sided chest pain, rectal pain which he attributes to hemorrhoids, right upper quadrant and epigastric abdominal pain, nausea, some shortness of breath, increased lower extremity edema, right-sided chest pain, wheezing. ?ED Course: Patient seen in the ED repeat comprehensive metabolic profile with a bicarb of 11, glucose of 60, BUN of 122, creatinine of 9.72, anion gap of 28, alk phosphatase of 612, albumin of 2.3, AST of 360, ALT of 21, protein of 5.4, bilirubin of 4.3.  BNP noted at 746.7 CBC done with a hemoglobin of 11.6 otherwise was within normal limits.  INR of 1.5.  Urinalysis cloudy, amber, large hemoglobin, 5 ketones, nitrite negative, leukocytes negative protein 100, specific gravity 1.014, few bacteria, 6-10 WBCs.  Chest x-ray done with increased interstitial and patchy airspace opacities in the right upper lung could represent worsening postobstructive pneumonitis and/or progressive malignancy.  Right suprahilar soft tissue prominence compatible with known mass and adenopathy.  Renal ultrasound with increased cortical echogenicity, cortical thinning in bilateral kidneys as can be seen with medical renal disease.  No hydronephrosis.  ED physician called and spoke with nephrology for formal  consultation, palliative care consultation also obtained and pending, GI consulted and pending who recommended CT abdomen and pelvis.  Hospitalist were called to admit the patient for further evaluation and management ?

## 2021-10-21 NOTE — Progress Notes (Signed)
Subjective:  Patient admitted to [facility] placed-  min UOP but some-  given bicarb drip-  kidney labs worse today.  He is in pain- very weak-  I helped him back in bed -  c/o rectal pain that pain meds are not helping ?Objective ?Vital signs in last 24 hours: ?Vitals:  ? 10/21/21 0026 10/21/21 0248 10/21/21 0733 10/21/21 0802  ?BP: (!) 116/41 (!) 119/44 (!) 97/46   ?Pulse:  85 86 85  ?Resp: 20 17 19 17   ?Temp: 98.4 ?F (36.9 ?C) 98.3 ?F (36.8 ?C) 97.6 ?F (36.4 ?C)   ?TempSrc: Oral Oral Oral   ?SpO2: 90% 92% 93% 93%  ?Weight:  89.7 kg    ?Height:      ? ?Weight change:  ? ?Intake/Output Summary (Last 24 hours) at 10/21/2021 0917 ?Last data filed at 10/21/2021 0541 ?Gross per 24 hour  ?Intake 249.91 ml  ?Output --  ?Net 249.91 ml  ? ? 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  His BP is soft but there are no meds to stop to elevate it. I dont think he is really dry with edema and no tachycardia.  I suspect his kidneys are just shutting down in the setting of organ failure related to metastatic cancer.  I really dont think dialysis will be useful here-  will just prolong suffering.  His prognosis is very poor whether we do dialysis or not and it is not going to improve his quality of life which is really poor right now.  I have told the daughter and the patient this. Im sorry-  I wish that I could do more.  I would recommend comfort care and hospice-  pain control  ?2. Hypertension/volume  - BP is soft, he has edema but likely mostly due to third spacing from dec albumin.  His CXR looks like PNA and cancer but does not outright call edema.  However, I am hesitant to give him ivf as I dont want his resp status to decline further-  getting IVF overnight but it hasnt really helped  ? ? ?Louis Meckel  ? ? ?Labs: ?Basic  Metabolic Panel: ?Recent Labs  ?Lab 10/16/2021 ?7425 09/24/2021 ?1343 10/21/21 ?0355  ?NA 139 138 137  ?K 3.9 4.0 4.3  ?CL 99 99 98  ?CO2 13* 11* 10*  ?GLUCOSE 62* 60* 60*  ?BUN 121* 122* 132*  ?CREATININE 9.28* 9.72* 10.81*  ?CALCIUM 8.6* 8.7* 7.9*  ? ?Liver Function Tests: ?Recent Labs  ?Lab 09/30/2021 ?9563 10/03/2021 ?1343 10/21/21 ?0355  ?AST 322* 360* 317*  ?ALT 112* 121* 112*  ?ALKPHOS 612* 612* 547*  ?BILITOT 4.1* 4.3* 3.9*  ?PROT 5.7* 5.7* 4.9*  ?ALBUMIN 2.9* 2.3* 2.0*  ? ?No results for input(s): LIPASE, AMYLASE in the last 168 hours. ?No results for input(s): AMMONIA in the last 168 hours. ?CBC: ?Recent Labs  ?Lab 10/16/21 ?1332 09/20/2021 ?8756 10/02/2021 ?1343 10/21/21 ?0355  ?WBC 8.2 9.6 8.8 9.0  ?NEUTROABS 6.7 8.2* 7.7  --   ?HGB 11.8* 11.2* 11.6* 10.3*  ?HCT 35.4* 34.2* 36.3* 31.1*  ?MCV 87.6 87.5 91.4 90.7  ?PLT 144* 130* 133* 131*  ? ?Cardiac Enzymes: ?No results for input(s): CKTOTAL, CKMB, CKMBINDEX, TROPONINI in the last 168 hours. ?CBG: ?Recent Labs  ?Lab  10/21/21 ?0615  ?GLUCAP 74  ? ? ?Iron Studies: No results for input(s): IRON, TIBC, TRANSFERRIN, FERRITIN in the last 72 hours. ?Studies/Results: ?CT ABDOMEN PELVIS WO CONTRAST ? ?Result Date: 09/27/2021 ?CLINICAL DATA:  Acute generalized abdominal pain. History of metastatic lung cancer. EXAM: CT ABDOMEN AND PELVIS WITHOUT CONTRAST TECHNIQUE: Multidetector CT imaging of the abdomen and pelvis was performed following the standard protocol without IV contrast. RADIATION DOSE REDUCTION: This exam was performed according to the departmental dose-optimization program which includes automated exposure control, adjustment of the mA and/or kV according to patient size and/or use of iterative reconstruction technique. COMPARISON:  September 21, 2021. FINDINGS: Lower chest: Minimal right pleural effusion is noted with adjacent subsegmental atelectasis. Several small nodules are noted in left lung base concerning for metastatic disease. Hepatobiliary: Status post  cholecystectomy. No biliary dilatation is noted. The liver is enlarged with multiple hypodensities consistent with diffuse metastatic disease. Pancreas: Unremarkable. No pancreatic ductal dilatation or surrounding inflammatory changes. Spleen: Normal in size without focal abnormality. Adrenals/Urinary Tract: Adrenal glands appear normal. No hydronephrosis or renal obstruction is noted. No renal or ureteral calculi are noted. Urinary bladder is unremarkable. Stomach/Bowel: The stomach appears normal. There is no evidence of bowel obstruction or inflammation. Sigmoid diverticulosis is noted. Status post appendectomy Vascular/Lymphatic: Aortic atherosclerosis. No enlarged abdominal or pelvic lymph nodes. Reproductive: Mild prostatic enlargement is noted. Other: No abdominal wall hernia or abnormality. No abdominopelvic ascites. Musculoskeletal: Destructive soft tissue lesion measuring 4.2 x 3.2 cm is noted in the right transverse process of L4. IMPRESSION: Enlarged liver is noted with multiple hypodensities consistent with diffuse metastatic disease. Several small nodules are noted in left lung base concerning for metastatic disease. Destructive soft tissue lesion is noted in right transverse process of L4 consistent with metastatic disease. Mild prostatic enlargement. Sigmoid diverticulosis without inflammation. Aortic Atherosclerosis (ICD10-I70.0). Electronically Signed   By: Marijo Conception M.D.   On: 09/25/2021 16:12  ? ?DG Chest 2 View ? ?Result Date: 10/06/2021 ?CLINICAL DATA:  sob EXAM: CHEST - 2 VIEW COMPARISON:  March 2, 23. FINDINGS: Increased interstitial and patchy airspace opacities in the right upper lung. Right suprahilar soft tissue prominence. No visible pneumothorax or pleural effusions. Cardiac silhouette is similar. Median sternotomy and CABG. IMPRESSION: Increased interstitial and patchy airspace opacities in the right upper lung, which could represent worsening postobstructive pneumonitis and or  progressive malignancy. Right suprahilar soft tissue prominence, compatible with known mass and adenopathy, better characterized on prior CT chest. Electronically Signed   By: Margaretha Sheffield M.D.   On: 09/27/2021 13:19  ? ?US Renal ? ?Result Date: 09/25/2021 ?CLINICAL DATA:  Elevated creatinine EXAM: RENAL / URINARY TRACT ULTRASOUND COMPLETE COMPARISON:  12/11/2017 FINDINGS: Right Kidney: Renal measurements: 8.8 x 5.4 x 6.7 cm = volume: 165 mL. Increased cortical echogenicity with cortical thinning. 2 cystic lesions are noted, an upper pole cyst measuring 1.3 x 1.6 x 2.1 cm and a lower pole cyst measuring 3.7 x 3.3 x 3.1 cm, previously up to 1.2 and 2.9 cm, respectively. Left Kidney: Renal measurements: 10.3 x 6.1 x 5.0 cm = volume: 167 mL. Increased cortical echogenicity with cortical thinning. A cystic lesion is noted in the upper pole, measuring 1.8 x 1.7 x 1.4 cm, which appears to be new from the prior exam. Bladder: Appears normal for degree of bladder distention. Other: Heterogeneous liver echogenicity. IMPRESSION: Increased cortical echogenicity and cortical thinning in the bilateral kidneys, as can be seen with medical renal disease. No hydronephrosis. Electronically Signed  By: Merilyn Baba M.D.   On: 09/23/2021 15:35   ?Medications: ?Infusions: ? albumin human    ? sodium bicarbonate 150 mEq in D5W infusion 100 mL/hr at 10/21/21 0901  ? ? ?Scheduled Medications: ? aspirin EC  81 mg Oral Daily  ? bisoprolol  10 mg Oral Daily  ? cholecalciferol  4,000 Units Oral Daily  ? feeding supplement  237 mL Oral BID BM  ? feeding supplement (NEPRO CARB STEADY)  237 mL Oral BID BM  ? heparin  5,000 Units Subcutaneous Q8H  ? hydrocortisone   Rectal QID  ? mometasone-formoterol  2 puff Inhalation BID  ? omega-3 acid ethyl esters  2 g Oral Daily  ? pantoprazole  40 mg Oral Daily  ? polyethylene glycol  17 g Oral Daily  ? rosuvastatin  40 mg Oral Daily  ? sodium chloride flush  3 mL Intravenous Q12H  ? umeclidinium  bromide  1 puff Inhalation Daily  ? ? have reviewed scheduled and prn medications. ? ?Physical Exam: ?General: pale, weak- distended abdomen-  in pain ?Heart: RRR ?Lungs: poor effort anteriorly  ?Abdomen: distended

## 2021-10-21 NOTE — Progress Notes (Signed)
OT Cancellation Note ? ?Patient Details ?Name: Paul Mcneil ?MRN: [id number] ?DOB: October 25, 1945 ? ? ?Cancelled Treatment:    Reason Eval/Treat Not Completed: Patient at procedure or test/ unavailable.  Patient meeting with family and palliative to discuss [place].  OT to continue as appropriate.   ? ?Merlon Alcorta D Atoya Andrew ?10/21/2021, 10:56 AM ?

## 2021-10-21 NOTE — Progress Notes (Signed)
?PROGRESS NOTE ? ? ? ?[name]  JYN:[id number] DOB: September 13, 1945 DOA: 10/05/2021 ?PCP: [name], MD  ? ? ?Chief Complaint  ?Patient presents with  ? Abnormal Lab  ? ? ?Brief Narrative:  ?Paul Mcneil is a 76 y.o. 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  Patient and daughter at bedside stating patient went to the cancer center for lab work and was called back to present to the ED due to abnormal labs of worsening renal function.  Lab work from oncology center with a creatinine of 9.28. ?Patient denies any fevers, no chills, no vomiting, no diarrhea, no melena, no hematemesis, no hematochezia.  Patient denies any syncopal episodes.  Patient does endorse some right-sided chest pain, rectal pain which he attributes to hemorrhoids, right upper quadrant and epigastric abdominal pain, nausea, some shortness of breath, increased lower extremity edema, right-sided chest pain, wheezing. ?ED Course: Patient seen in the ED repeat comprehensive metabolic profile with a bicarb of 11, glucose of 60, BUN of 122, creatinine of 9.72, anion gap of 28, alk phosphatase of 612, albumin of 2.3, AST of 360, ALT of 21, protein of 5.4, bilirubin of 4.3.  BNP noted at 746.7 CBC done with a hemoglobin of 11.6 otherwise was within normal limits.  INR of 1.5.  Urinalysis cloudy, amber, large hemoglobin, 5 ketones, nitrite negative, leukocytes negative protein 100, specific gravity 1.014, few bacteria, 6-10 WBCs.  Chest x-ray done with increased interstitial and patchy airspace opacities in the right upper lung could represent worsening postobstructive pneumonitis and/or progressive malignancy.  Right suprahilar soft tissue prominence compatible with known mass and adenopathy.  Renal ultrasound with  increased cortical echogenicity, cortical thinning in bilateral kidneys as can be seen with medical renal disease.  No hydronephrosis.  ED physician called and spoke with nephrology for formal consultation, palliative care consultation also obtained and pending, GI consulted and pending who recommended CT abdomen and pelvis.  Hospitalist were called to admit the patient for further evaluation and management  ? ? ?Assessment & Plan: ? Principal Problem: ?  Acute renal failure superimposed on stage 4 chronic kidney disease ([place]) ?Active Problems: ?  Acute metabolic acidosis ?  Elevated LFTs ?  Emphysema ([place]) ?  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  CAD, multiple vessel ?  Hyperlipidemia associated with type 2 diabetes mellitus ([place]) ?  S/P CABG x 4 ?  Essential hypertension ?  Malnutrition of moderate degree ?  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  Anemia due to chronic kidney disease ?  Rectal pain ? ? ? ?Assessment and Plan: ?* Acute renal failure superimposed on stage 4 chronic kidney disease ([name]) ?- Patient presenting with acute renal failure on chronic kidney disease stage IV. ?-Creatinine currently at 10.81 from 9.72(10/14/2021) from 6.1(10/16/2021) from 3.86 on 09/25/2021.  Patient's baseline creatinine noted approximately 2.5 03/05/2018 and has been steadily increasing since 09/21/2021. ?-Likely secondary to prerenal azotemia in the setting of poor oral intake versus progressive chronic kidney disease. ?-Patient with metastatic small cell lung cancer with mets to the liver and bone and patient with poor prognosis was to start palliative chemotherapy 10/21/2021. ?-Renal ultrasound done negative for hydronephrosis and consistent with medical renal disease. ?-UA with some proteinuria. ?-Urine sodium of 18, urine  creatinine of 70.24. ?-Foley catheter placed with minimal urine output. ?-Patient noted with some lower extremity edema and was placed on bicarb drip. ?-Patient seen in  consultation by nephrology, who feel patient's kidney such as shutting down in the setting of organ failure secondary to metastatic cancer. ?-Nephrology does not think dialysis will be useful here and will just prolong suffering. ?-Per nephrology patient with a very poor prognosis whether he undergoes dialysis or not and is not going to improve his quality of life and have recommended comfort care and hospice with pain control. ?-Patient seen by palliative care earlier on this morning and decision made to transition to comfort measures. ?-Patient to be started on Dilaudid drip. ?-Discontinue bicarb drip. ?-Diet has been liberalized. ?-Palliative care following. ? ?Acute metabolic acidosis ?- Likely secondary to acute renal failure. ?-Patient with no signs and symptoms of infection. ?-Patient with low blood glucose levels secondary to poor oral intake. ?-Patient seen by nephrology who are recommending comfort measures. ?-See above. ?-Discontinue bicarb drip. ?-Patient seen by palliative care and decision made to transition to full comfort measures. ? ?Elevated LFTs ?- Patient with a transaminitis likely secondary to metastatic small cell lung cancer. ?-ED physician spoke with GI, Dr. Paulita Fujita for formal consultation why recommended CT abdomen and pelvis. ?-CT abdomen and pelvis done with an enlarged liver noted with multiple hypodensities consistent with diffuse metastatic disease, several small nodules noted at the left lung base concerning for metastatic disease, destructive soft tissue lesion in the right transverse process of L4 consistent with metastatic disease, mild prostatic enlargement, sigmoid diverticulosis without inflammation. ?-CT abdomen and pelvis reviewed by gastroenterology, Dr. Paulita Fujita who has communicated to me that he does not feel ERCP will be helpful at this time and nothing further to offer this unfortunate gentleman at this time. ?-Patient seen by palliative care and decision made to transition  to full comfort measures. ? 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   ?-Palliative care discussed with patient and family and decision made to transition to full comfort measures. ? ?Emphysema ([place]) ?- Continue Dulera, Spiriva.   ?-DuoNebs as needed. ? ?Rectal pain ?- Patient with complaints of rectal pain feels secondary to hemorrhoids. ?-Continue Anusol rectal cream 4 times daily.   ?-Sitz bath's.   ?-Pain management.   ? ?Anemia due to chronic kidney disease ?- Likely secondary to CKD stage IV. ?-H&H stable. ?-Patient being transitioned to comfort measures. ? 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 ?-Patient was to start palliative chemotherapy on 10/21/2021 per oncology. ?-Oncology informed of patient's admission via epic. ?-Patient with worsening renal function, poor prognosis, liver mets. ?-Palliative care consulted and decision made to transition to full comfort measures. ? ?Malnutrition of moderate degree ?- Palliative care has assessed patient and patient being transitioned to full comfort measures.  ? ?Essential hypertension ?- BP soft. ? ?S/P CABG x 4 ?- Continue Zebeta.   ?-Patient being transitioned to full comfort measures. ? ?Hyperlipidemia associated with type 2 diabetes mellitus ([place]) ?- Was on a statin which has been discontinued as patient being transitioned to full comfort measures. ? ?CAD, multiple vessel ?- Continue Zebeta.  Statin discontinued.   ?-Patient being transitioned to full comfort measures. ? ? ? ? ?  ? ? ?DVT prophylaxis: Comfort care ?Code Status: DNR ?Family Communication: Updated patient.  No family at  bedside. ?Disposition: Likely in-hospital death versus residential hospice ? ?Status is: Inpatient ?Remains inpatient appropriate because: Severity of illness ?  ?Consultants:  ?Nephrology: Dr. Moshe Cipro  09/22/2021 ?Palliative care ?Gastroenterology: Dr. Paulita Fujita ? ?Procedures:  ?CT abdomen pelvis 10/15/2021 ?Chest x-ray 10/08/2021 ?Renal ultrasound 09/20/2021 ? ? ? ? ?Antimicrobials:  ?None ? ? ?Subjective: ?Patient laying i

## 2021-10-21 DEATH — deceased

## 2021-10-23 ENCOUNTER — Inpatient Hospital Stay: Payer: Medicare Other

## 2021-10-23 ENCOUNTER — Other Ambulatory Visit: Payer: Medicare Other

## 2021-10-24 ENCOUNTER — Other Ambulatory Visit: Payer: Medicare Other

## 2021-10-24 ENCOUNTER — Inpatient Hospital Stay: Payer: Medicare Other

## 2021-10-25 ENCOUNTER — Ambulatory Visit: Payer: Medicare Other

## 2021-10-25 ENCOUNTER — Ambulatory Visit: Payer: Medicare Other | Admitting: Radiation Oncology

## 2021-10-25 ENCOUNTER — Encounter: Payer: Self-pay | Admitting: *Deleted

## 2021-10-25 ENCOUNTER — Inpatient Hospital Stay: Payer: Medicare Other

## 2021-10-25 NOTE — Progress Notes (Signed)
Oncology Nurse Navigator Documentation ? ? ?  10/25/2021  ?  4:00 PM 10/18/2021  ?  2:00 PM 10/18/2021  ? 12:00 PM 10/17/2021  ? 10:00 AM 10/16/2021  ?  4:00 PM 10/06/2021  ? 12:00 PM  ?Oncology Nurse Navigator Flowsheets  ?Abnormal Finding Date   09/21/2021     ?Confirmed Diagnosis Date   09/25/2021     ?Diagnosis Status   Confirmed Diagnosis Complete     ?Planned Course of Treatment   Chemotherapy;Targeted Therapy     ?Phase of Treatment   Targeted Therapy     ?Navigator Follow Up Date:   10/23/2021  10/18/2021 10/13/2021  ?Navigator Follow Up Reason:   Appointment Review;Radiology  Appointment Review New Patient Appointment  ?Navigator Location [facility]  ?Referral Date to RadOnc/MedOnc      10/06/2021  ?Navigator Encounter Type Appt/Treatment Plan Review Telephone Appt/Treatment Plan Review Appt/Treatment Plan Review;Telephone Clinic/MDC;Initial MedOnc Initial MedOnc  ?Telephone  Outgoing Call      ?Patient Visit Type  Other Other Other MedOnc;Initial Initial  ?Treatment Phase     Pre-Tx/Tx Discussion Pre-Tx/Tx Discussion  ?Barriers/Navigation Needs Coordination of Care/I followed up on Mr. Hazel plan of care.  Unfortunately, patient passed away.  I will update team.  Coordination of Care;Education Coordination of Care Education;Coordination of Care Education;Coordination of Care Coordination of Care  ?Education    Other Newly Diagnosed Cancer Education;Understanding Cancer/ Treatment Options;Other   ?Interventions Coordination of Care Coordination of Care;Education Coordination of Care Coordination of Care Coordination of Care;Education;Psycho-Social Support Coordination of Care  ?Acuity Level 2-Minimal Needs (1-2 Barriers Identified) Level 3-Moderate Needs (3-4 Barriers Identified) Level 2-Minimal Needs (1-2 Barriers Identified) Level 3-Moderate Needs (3-4 Barriers Identified) Level 3-Moderate Needs (3-4 Barriers Identified) Level  2-Minimal Needs (1-2 Barriers Identified)  ?Coordination of Care Other Other Other Appts Other Other  ?Education Method    Verbal Verbal;Other   ?Time Spent with Patient 30 45 30 30 60 30  ?  ?  ?

## 2021-10-26 ENCOUNTER — Other Ambulatory Visit (HOSPITAL_COMMUNITY): Payer: Medicare Other

## 2021-10-26 ENCOUNTER — Ambulatory Visit (HOSPITAL_COMMUNITY): Payer: Medicare Other

## 2021-10-31 ENCOUNTER — Ambulatory Visit: Payer: Medicare Other | Admitting: Internal Medicine

## 2021-10-31 ENCOUNTER — Other Ambulatory Visit: Payer: Medicare Other

## 2021-11-01 ENCOUNTER — Ambulatory Visit: Payer: Medicare Other

## 2021-11-01 ENCOUNTER — Ambulatory Visit: Payer: Medicare Other | Admitting: Radiation Oncology

## 2021-11-03 ENCOUNTER — Other Ambulatory Visit (HOSPITAL_COMMUNITY): Payer: Medicare Other

## 2021-11-06 ENCOUNTER — Other Ambulatory Visit: Payer: Medicare Other

## 2021-11-13 ENCOUNTER — Other Ambulatory Visit: Payer: Medicare Other

## 2021-11-13 ENCOUNTER — Ambulatory Visit: Payer: Medicare Other | Admitting: Physician Assistant

## 2021-11-13 ENCOUNTER — Ambulatory Visit: Payer: Medicare Other

## 2021-11-14 ENCOUNTER — Ambulatory Visit: Payer: Medicare Other

## 2021-11-15 ENCOUNTER — Ambulatory Visit: Payer: Medicare Other

## 2021-11-20 ENCOUNTER — Other Ambulatory Visit: Payer: Medicare Other

## 2021-11-20 NOTE — Progress Notes (Signed)
[facility] donor was called. Ref. # [id number]. Spoke to Sharon, as per her, Pt maybe eligible and needs review. ?

## 2021-11-20 NOTE — Death Summary Note (Signed)
? ?DEATH SUMMARY  ? ?Patient Details  ?Name: Paul Mcneil ?MRN: [id number] ?DOB: [birth year] ?MEB:RAXENMM, [place], MD ?Admission/Discharge Information  ? ?Admit Date:  2021/11/05  ?Date of Death: Date of Death: 2021/11/07  ?Time of Death: Time of Death: 63  ?Length of Stay: 2  ? ?Principle Cause of death: Acute renal failure on chronic kidney disease stage IV likely secondary to metastatic small cell lung cancer. ? ?Hospital Diagnoses: ?Principal Problem: ?  Acute renal failure superimposed on stage 4 chronic kidney disease ([place]) ?Active Problems: ?  Acute metabolic acidosis ?  Elevated LFTs ?  Emphysema ([place]) ?  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  CAD, multiple vessel ?  Hyperlipidemia associated with type 2 diabetes mellitus ([place]) ?  S/P CABG x 4 ?  Essential hypertension ?  Malnutrition of moderate degree ?  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  Anemia due to chronic kidney disease ?  Rectal pain ?  Icterus ? ? ?Hospital Course: ?Paul Mcneil is a 76 y.o. 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  Patient and daughter at bedside stating patient went to the cancer center for lab work and was called back to present to the ED due to abnormal labs of worsening renal function.  Lab work from oncology center with a creatinine of 9.28. ?Patient denies any fevers, no chills, no vomiting, no diarrhea, no melena, no hematemesis, no hematochezia.  Patient denies any syncopal episodes.  Patient does endorse some right-sided chest pain, rectal pain which he attributes to hemorrhoids, right upper quadrant and epigastric abdominal pain, nausea, some shortness of breath, increased lower extremity edema, right-sided chest pain, wheezing. ?ED Course:  Patient seen in the ED repeat comprehensive metabolic profile with a bicarb of 11, glucose of 60, BUN of 122, creatinine of 9.72, anion gap of 28, alk phosphatase of 612, albumin of 2.3, AST of 360, ALT of 21, protein of 5.4, bilirubin of 4.3.  BNP noted at 746.7 CBC done with a hemoglobin of 11.6 otherwise was within normal limits.  INR of 1.5.  Urinalysis cloudy, amber, large hemoglobin, 5 ketones, nitrite negative, leukocytes negative protein 100, specific gravity 1.014, few bacteria, 6-10 WBCs.  Chest x-ray done with increased interstitial and patchy airspace opacities in the right upper lung could represent worsening postobstructive pneumonitis and/or progressive malignancy.  Right suprahilar soft tissue prominence compatible with known mass and adenopathy.  Renal ultrasound with increased cortical echogenicity, cortical thinning in bilateral kidneys as can be seen with medical renal disease.  No hydronephrosis.  ED physician called and spoke with nephrology for formal consultation, palliative care consultation also obtained and pending, GI consulted and pending who recommended CT abdomen and pelvis.  Hospitalist were called to admit the patient for further evaluation and management ? ?Assessment and Plan: ?* Acute renal failure superimposed on stage 4 chronic kidney disease ([name]) ?- Patient presenting with acute renal failure on chronic kidney disease stage IV. ?-Creatinine currently at 10.81 from 9.72(11-05-21) from 6.1(10/16/2021) from 3.86 on 09/25/2021.  Patient's baseline creatinine noted approximately 2.5 03/05/2018 and has been steadily increasing since 09/21/2021. ?-Likely secondary to prerenal azotemia in the setting of poor oral intake versus progressive chronic kidney disease. ?-Patient with metastatic small cell lung cancer with mets to the  liver and bone and patient with poor prognosis was to start palliative chemotherapy 10/21/2021. ?-Renal ultrasound done negative for hydronephrosis and consistent with  medical renal disease. ?-UA with some proteinuria. ?-Urine sodium of 18, urine creatinine of 70.24. ?-Foley catheter placed with minimal urine output. ?-Patient noted with some lower extremity edema and was placed on bicarb drip. ?-Patient seen in consultation by nephrology, who feel patient's kidneys were shutting down in the setting of organ failure secondary to metastatic cancer. ?-Nephrology does not think dialysis will be useful here and will just prolong suffering. ?-Per nephrology patient with a very poor prognosis whether he undergoes dialysis or not and is not going to improve his quality of life and have recommended comfort care and hospice with pain control. ?-Patient seen by palliative care and decision made to transition to comfort measures. ?-Patient started on Dilaudid drip. ?-Diet was liberalized. ?-For 3 hours on 10/30/21 ?-May his soul rest in peace.  ?-Discontinue bicarb drip. ?-Diet has been liberalized. ?-Palliative care following. ? ?Acute metabolic acidosis ?- Likely secondary to acute renal failure. ?-Patient with no signs and symptoms of infection. ?-Patient with low blood glucose levels secondary to poor oral intake. ?-Patient seen by nephrology who recommended comfort measures. ?-See above. ?-Patient initially started on bicarb drip which was subsequently discontinued. ?-Patient seen by palliative care and decision made to transition to full comfort measures. ?-Patient was kept comfortable, placed on a Dilaudid drip. ?Patient subsequently died at 0443 hours on 10/30/21 ? ?Elevated LFTs ?- Patient with a transaminitis likely secondary to metastatic small cell lung cancer. ?-ED physician spoke with GI, Dr. Paulita Fujita for formal consultation why recommended CT abdomen and pelvis. ?-CT abdomen and pelvis done with an enlarged liver noted with multiple hypodensities consistent with diffuse metastatic disease, several small nodules noted at the left lung base concerning for metastatic disease,  destructive soft tissue lesion in the right transverse process of L4 consistent with metastatic disease, mild prostatic enlargement, sigmoid diverticulosis without inflammation. ?-CT abdomen and pelvis reviewed by gastroenterology, Dr. Paulita Fujita who has communicated to me that he does not feel ERCP will be helpful at this time and nothing further to offer this unfortunate gentleman at this time. ?-Patient seen by palliative care and decision made to transition to full comfort measures. ? 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   ?-Palliative care discussed with patient and family and decision made to transition to full comfort measures. ? ?Emphysema ([place]) ?- Patient was placed on Dulera, Spiriva.   ?-DuoNebs as needed. ?-Patient transitioned to full comfort measures. ? ?Rectal pain ?- Patient with complaints of rectal pain feels secondary to hemorrhoids. ?-Patient placed on Anusol rectal cream 4 times daily.   ?-Sitz bath's.   ?-Pain management.   ?-Patient transitioned to full comfort measures. ? ?Anemia due to chronic kidney disease ?- Likely secondary to CKD stage IV. ?-H&H stable. ?-Patient transitioned to comfort measures. ? ? 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 ?-Patient was to start palliative chemotherapy on 10/21/2021 per oncology. ?-Oncology informed of patient's admission via epic. ?-Patient with worsening renal function, poor prognosis, liver mets. ?-Palliative care consulted and decision made to transition to full comfort measures. ?-Patient was kept comfortable and subsequently died at 0443 hrs. on October 30, 2021 ?-May his soul rest in peace. ? ?Malnutrition of moderate degree ?- Palliative care has assessed patient and  patient transitioned to full comfort measures.  ? ?Essential hypertension ?- BP soft. ?-Patient subsequently transitioned to full comfort  measures. ? ?S/P CABG x 4 ?- Continue Zebeta.   ?-Patient transitioned to full comfort measures. ? ?Hyperlipidemia associated with type 2 diabetes mellitus ([place]) ?- Was on a statin which has been discontinued

## 2021-11-20 NOTE — Progress Notes (Signed)
Pt on dilaudid QTT, wasted 37.5. witnessed by [facility] RN.  ?

## 2021-11-20 DEATH — deceased

## 2021-11-27 ENCOUNTER — Ambulatory Visit: Payer: Medicare Other | Admitting: Cardiology

## 2021-11-27 ENCOUNTER — Other Ambulatory Visit: Payer: Medicare Other

## 2021-11-28 ENCOUNTER — Other Ambulatory Visit: Payer: Medicare Other

## 2021-12-04 ENCOUNTER — Ambulatory Visit: Payer: Medicare Other | Admitting: Internal Medicine

## 2021-12-04 ENCOUNTER — Ambulatory Visit: Payer: Medicare Other

## 2021-12-04 ENCOUNTER — Other Ambulatory Visit: Payer: Medicare Other

## 2021-12-05 ENCOUNTER — Ambulatory Visit: Payer: Medicare Other

## 2021-12-06 ENCOUNTER — Ambulatory Visit: Payer: Medicare Other

## 2021-12-11 ENCOUNTER — Other Ambulatory Visit: Payer: Medicare Other
